# Patient Record
Sex: Female | Born: 1960 | Race: White | Hispanic: No | Marital: Single | Smoking: Former smoker
Health system: Southern US, Community
[De-identification: ages and names within clinical notes are randomized; demographics above are authoritative.]

## PROBLEM LIST (undated history)

## (undated) DIAGNOSIS — I10 Essential (primary) hypertension: Secondary | ICD-10-CM

## (undated) DIAGNOSIS — M545 Low back pain, unspecified: Secondary | ICD-10-CM

## (undated) DIAGNOSIS — G8929 Other chronic pain: Secondary | ICD-10-CM

## (undated) DIAGNOSIS — N952 Postmenopausal atrophic vaginitis: Secondary | ICD-10-CM

## (undated) DIAGNOSIS — J449 Chronic obstructive pulmonary disease, unspecified: Secondary | ICD-10-CM

## (undated) DIAGNOSIS — I809 Phlebitis and thrombophlebitis of unspecified site: Secondary | ICD-10-CM

## (undated) DIAGNOSIS — E785 Hyperlipidemia, unspecified: Secondary | ICD-10-CM

## (undated) DIAGNOSIS — F32A Depression, unspecified: Secondary | ICD-10-CM

## (undated) DIAGNOSIS — K219 Gastro-esophageal reflux disease without esophagitis: Secondary | ICD-10-CM

## (undated) DIAGNOSIS — G4733 Obstructive sleep apnea (adult) (pediatric): Secondary | ICD-10-CM

## (undated) DIAGNOSIS — M25579 Pain in unspecified ankle and joints of unspecified foot: Secondary | ICD-10-CM

## (undated) DIAGNOSIS — F329 Major depressive disorder, single episode, unspecified: Secondary | ICD-10-CM

## (undated) HISTORY — DX: Low back pain, unspecified: M54.50

## (undated) HISTORY — DX: Phlebitis and thrombophlebitis of unspecified site: I80.9

## (undated) HISTORY — DX: Hyperlipidemia, unspecified: E78.5

## (undated) HISTORY — DX: Other chronic pain: G89.29

## (undated) HISTORY — PX: CYST REMOVAL HAND: SHX6279

## (undated) HISTORY — PX: SPINE SURGERY: SHX786

## (undated) HISTORY — DX: Pain in unspecified ankle and joints of unspecified foot: M25.579

## (undated) HISTORY — DX: Obstructive sleep apnea (adult) (pediatric): G47.33

## (undated) HISTORY — DX: Essential (primary) hypertension: I10

## (undated) HISTORY — DX: Chronic obstructive pulmonary disease, unspecified: J44.9

## (undated) HISTORY — DX: Postmenopausal atrophic vaginitis: N95.2

## (undated) HISTORY — DX: Gastro-esophageal reflux disease without esophagitis: K21.9

## (undated) HISTORY — DX: Depression, unspecified: F32.A

---
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
## 1988-09-17 HISTORY — PX: BACK SURGERY: SHX140

## 2008-09-17 DIAGNOSIS — J189 Pneumonia, unspecified organism: Secondary | ICD-10-CM

## 2008-09-17 HISTORY — DX: Pneumonia, unspecified organism: J18.9

## 2011-05-22 ENCOUNTER — Encounter: Payer: Self-pay | Admitting: Vascular Surgery

## 2011-06-07 ENCOUNTER — Encounter: Payer: Self-pay | Admitting: Vascular Surgery

## 2011-06-11 ENCOUNTER — Encounter: Payer: Self-pay | Admitting: Vascular Surgery

## 2011-06-12 ENCOUNTER — Encounter: Payer: Self-pay | Admitting: Vascular Surgery

## 2011-06-12 ENCOUNTER — Ambulatory Visit (INDEPENDENT_AMBULATORY_CARE_PROVIDER_SITE_OTHER): Payer: MEDICARE | Admitting: Vascular Surgery

## 2011-06-12 VITALS — BP 111/72 | HR 81 | Resp 16 | Ht 64.0 in | Wt 265.0 lb

## 2011-06-12 DIAGNOSIS — I8310 Varicose veins of unspecified lower extremity with inflammation: Secondary | ICD-10-CM

## 2011-06-12 DIAGNOSIS — E1159 Type 2 diabetes mellitus with other circulatory complications: Secondary | ICD-10-CM

## 2011-06-12 NOTE — Progress Notes (Signed)
Subjective:     Patient ID: Brandi Velazquez, female   DOB: 09-May-1961, 50 y.o.   MRN: [id number]  HPI this 50 year old female was referred by Dr. Ardyth Gal for venous insufficiency of both lower extremities. The patient develops swelling in the calf and ankle areas the day progresses. She has developed dark scan in both lower legs over the last several years. He has no history of DVT or thrombophlebitis. She has no history of stasis ulcers or bleeding. He does have some aching throbbing discomfort in both legs as the day progresses. She does not wear elastic compression stockings for elevate her legs on her regular basis.  Past Medical History  Diagnosis Date  . Diabetes mellitus   . Ankle pain   . Hypertension   . Hyperlipidemia   . Superficial phlebitis     History  Substance Use Topics  . Smoking status: Former Smoker    Quit date: 03/20/2009  . Smokeless tobacco: Not on file  . Alcohol Use: No    Family History  Problem Relation Age of Onset  . Diabetes Mother   . Heart disease Mother   . Cancer Father   . Stroke Sister     Allergies  Allergen Reactions  . Morphine And Related     Current outpatient prescriptions:aspirin 325 MG tablet, Take 325 mg by mouth daily.  , Disp: , Rfl: ;  furosemide (LASIX) 20 MG tablet, Take 20 mg by mouth daily.  , Disp: , Rfl: ;  insulin detemir (LEVEMIR) 100 UNIT/ML injection, Inject 35 Units into the skin at bedtime.  , Disp: , Rfl: ;  lisinopril (PRINIVIL,ZESTRIL) 5 MG tablet, Take 5 mg by mouth daily.  , Disp: , Rfl:  metFORMIN (GLUMETZA) 1000 MG (MOD) 24 hr tablet, Take 1,000 mg by mouth daily with breakfast.  , Disp: , Rfl: ;  pioglitazone (ACTOS) 30 MG tablet, Take 30 mg by mouth daily.  , Disp: , Rfl: ;  pravastatin (PRAVACHOL) 10 MG tablet, Take 20 mg by mouth daily. , Disp: , Rfl:   BP 111/72  Pulse 81  Resp 16  Ht 5\' 4"  (1.626 m)  Wt 265 lb (120.203 kg)  BMI 45.49 kg/m2  SpO2 97%  Body mass index is 45.49  kg/(m^2).         Review of Systems she complains of severe back problems having had back surgery on 2 occasions. She has decreased ability to walk long distances. Reflux esophagitis, denies chest pain dyspnea on exertion PND or orthopnea. All other systems are negative and complete review of systems    Objective:   Physical Exam blood pressure 111/72 heart rate 81 respirations 16 Generally she is an obese middle-aged female no apparent distress alert and oriented x3 Chest free of rhonchi or wheezing Cardiovascular regular rhythm no murmurs carotid pulses 3+ no audible bruits Abdomen obese no palpable masses Neurologic exam normal Musculoskeletal free of major deformities Skin free of rashes. She has hyperpigmentation in both lower legs from the mid calf to the ankle medially. There are no active ulcers. She has diffuse spider and reticular veins in the lower legs below the knees. She has 1+ edema. She has 3+ femoral popliteal and 2+ dorsalis pedis pulses palpable bilaterally.  I performed a bedside sono site duplex scan today and did not see a large great saphenous vein bilaterally nor definite reflux in the superficial systems. He needs formal venous duplex reflux study bilateral     Assessment:    bilateral venous  insufficiency-etiology unknown at this time    Plan:     We'll schedule bilateral venous reflux study in our office in the very near future If there is no superficial reflux to be treated with laser ablation, treatment will consist of short leg elastic compression stockings and elevation of legs at night. I discussed this with patient and we will inform her of results once completed

## 2011-06-12 NOTE — Progress Notes (Signed)
Addended by: Sharee Pimple on: 06/12/2011 10:56 AM   Modules accepted: Orders

## 2011-06-14 ENCOUNTER — Encounter: Payer: Self-pay | Admitting: Vascular Surgery

## 2011-06-14 ENCOUNTER — Other Ambulatory Visit: Payer: Self-pay | Admitting: *Deleted

## 2011-06-14 ENCOUNTER — Ambulatory Visit (INDEPENDENT_AMBULATORY_CARE_PROVIDER_SITE_OTHER): Payer: MEDICARE | Admitting: Vascular Surgery

## 2011-06-14 DIAGNOSIS — I83893 Varicose veins of bilateral lower extremities with other complications: Secondary | ICD-10-CM

## 2011-06-14 DIAGNOSIS — I83813 Varicose veins of bilateral lower extremities with pain: Secondary | ICD-10-CM | POA: Insufficient documentation

## 2011-06-14 DIAGNOSIS — M79609 Pain in unspecified limb: Secondary | ICD-10-CM

## 2011-06-14 DIAGNOSIS — I8310 Varicose veins of unspecified lower extremity with inflammation: Secondary | ICD-10-CM

## 2011-06-14 NOTE — Progress Notes (Unsigned)
Patient came for bilateral duplex today. She does have reflux in both GSVs. Fitted her for thigh high 20-30 mm Hg compression stockings and made a three month follow up with Dr. Hart Rochester. Explained the R GSV may have only a small segment to close and that Dr. Hart Rochester would have to look at that, but that the left side is more straightforward, so we need to go through the three months of conservative tx.

## 2011-06-14 NOTE — Progress Notes (Signed)
LEVC REFLUX performed VVS 06/14/2011

## 2011-06-20 NOTE — Procedures (Unsigned)
LOWER EXTREMITY VENOUS REFLUX EXAM  INDICATION:  Lower extremity pain and varicose veins.  EXAM:  Using color-flow imaging and pulse Doppler spectral analysis, the bilateral common femoral, superficial femoral, popliteal, posterior tibial, greater and lesser saphenous veins are evaluated.  There is evidence suggesting deep venous insufficiency in the bilateral lower extremities.  The right saphenofemoral junction is competent. The left saphenofemoral junction is not competent with reflux >500 milliseconds.  The bilateral GSV's are not competent with Reflux of >555milliseconds with the caliber as described below.  The bilateral proximal short saphenous vein demonstrates competency.  GSV Diameter (used if found to be incompetent only)                                           Right    Left Proximal Greater Saphenous Vein           0.86 cm  1.01 cm Proximal-to-mid-thigh                     0.50 cm  0.62 cm Mid thigh                                 0.44 cm  0.48 cm Mid-distal thigh                          cm       cm Distal thigh                              0.49 cm  0.48 cm Knee                                      0.39 cm  0.47 cm  IMPRESSION: 1. The bilateral greater saphenous veins are not competent with reflux     >549milliseconds. 2. The right greater saphenous vein is tortuous.  The left greater     saphenous vein is not tortuous. 3. The deep venous system is not competent with reflux of     >554milliseconds. 4. The bilateral small saphenous veins are competent. 5. The right proximal mid calf demonstrates perforator measuring 0.30     cm, which appears competent.         ___________________________________________ Quita Skye. Hart Rochester, M.D.  SH/MEDQ  D:  06/14/2011  T:  06/14/2011  Job:  [id number]

## 2011-09-24 ENCOUNTER — Encounter: Payer: Self-pay | Admitting: Vascular Surgery

## 2011-09-25 ENCOUNTER — Encounter: Payer: Self-pay | Admitting: Vascular Surgery

## 2011-09-25 ENCOUNTER — Ambulatory Visit (INDEPENDENT_AMBULATORY_CARE_PROVIDER_SITE_OTHER): Payer: Medicare Other | Admitting: Vascular Surgery

## 2011-09-25 VITALS — BP 122/85 | HR 82 | Resp 18 | Ht 64.0 in | Wt 265.0 lb

## 2011-09-25 DIAGNOSIS — I83893 Varicose veins of bilateral lower extremities with other complications: Secondary | ICD-10-CM

## 2011-09-25 NOTE — Progress Notes (Signed)
Subjective:     Patient ID: Brandi Velazquez, female   DOB: 1960-12-28, 51 y.o.   MRN: [id number]  HPI this 51 year old female returns for further discussion about possible venous insufficiency of both lower extremities. She has tried long-leg elastic compression stockings 20-30 mm gradient as well as elevation and ibuprofen. She continues to have discomfort and swelling in both legs.  Today I ordered bilateral venous duplex exam which are reviewed and interpreted. She does have one area of reflux in the right great saphenous vein near the distal thigh. The left great saphenous vein has multiple areas of reflux but it is not a large vein. There is no DVT is mild reflux in the deep system. I personally looked at the left great saphenous vein with the SonoSite ultrasound and it is too small for access even know it does have some reflux.   Review of Systems     Objective:   Physical ExamBP 122/85  Pulse 82  Resp 18  Ht 5\' 4"  (1.626 m)  Wt 265 lb (120.203 kg)  BMI 45.49 kg/m2 Lower extremity exam reveals 2+ dorsalis pedis pulses palpable bilaterally. She does have hyperpigmentation medially in the lower third of the leg bilaterally. There no bulging varicosities. Her feet are well-perfused. She has 1+ edema bilaterally.    Assessment:     Pain and swelling both lower extremities but no vein large enough to treat with laser ablation.    Plan:     #1 short leg elastic compression stockings on a daily basis #2 elevate legs at night #3 if symptoms can worsen over the next few years we could repeat duplex scan to see if this vein-great saphenous vein on the left-has gotten larger and could be treated with laser ablation but currently that is not the case

## 2012-01-02 ENCOUNTER — Other Ambulatory Visit: Payer: Self-pay | Admitting: Vascular Surgery

## 2014-10-27 DIAGNOSIS — E780 Pure hypercholesterolemia: Secondary | ICD-10-CM | POA: Diagnosis not present

## 2014-10-27 DIAGNOSIS — M79662 Pain in left lower leg: Secondary | ICD-10-CM | POA: Diagnosis not present

## 2014-10-27 DIAGNOSIS — E782 Mixed hyperlipidemia: Secondary | ICD-10-CM | POA: Diagnosis not present

## 2014-10-27 DIAGNOSIS — E1165 Type 2 diabetes mellitus with hyperglycemia: Secondary | ICD-10-CM | POA: Diagnosis not present

## 2014-10-27 DIAGNOSIS — I10 Essential (primary) hypertension: Secondary | ICD-10-CM | POA: Diagnosis not present

## 2014-10-27 DIAGNOSIS — M545 Low back pain: Secondary | ICD-10-CM | POA: Diagnosis not present

## 2014-10-27 DIAGNOSIS — E119 Type 2 diabetes mellitus without complications: Secondary | ICD-10-CM | POA: Diagnosis not present

## 2014-10-28 DIAGNOSIS — M79605 Pain in left leg: Secondary | ICD-10-CM | POA: Diagnosis not present

## 2014-11-16 DIAGNOSIS — D72829 Elevated white blood cell count, unspecified: Secondary | ICD-10-CM | POA: Diagnosis not present

## 2014-11-29 DIAGNOSIS — J0100 Acute maxillary sinusitis, unspecified: Secondary | ICD-10-CM | POA: Diagnosis not present

## 2014-11-29 DIAGNOSIS — J209 Acute bronchitis, unspecified: Secondary | ICD-10-CM | POA: Diagnosis not present

## 2014-11-29 DIAGNOSIS — R05 Cough: Secondary | ICD-10-CM | POA: Diagnosis not present

## 2015-02-01 DIAGNOSIS — E1142 Type 2 diabetes mellitus with diabetic polyneuropathy: Secondary | ICD-10-CM | POA: Diagnosis not present

## 2015-02-01 DIAGNOSIS — I10 Essential (primary) hypertension: Secondary | ICD-10-CM | POA: Diagnosis not present

## 2015-02-01 DIAGNOSIS — E119 Type 2 diabetes mellitus without complications: Secondary | ICD-10-CM | POA: Diagnosis not present

## 2015-02-01 DIAGNOSIS — M545 Low back pain: Secondary | ICD-10-CM | POA: Diagnosis not present

## 2015-02-01 DIAGNOSIS — E780 Pure hypercholesterolemia: Secondary | ICD-10-CM | POA: Diagnosis not present

## 2015-02-15 DIAGNOSIS — I10 Essential (primary) hypertension: Secondary | ICD-10-CM | POA: Diagnosis not present

## 2015-03-02 DIAGNOSIS — H40003 Preglaucoma, unspecified, bilateral: Secondary | ICD-10-CM | POA: Diagnosis not present

## 2015-06-13 DIAGNOSIS — M545 Low back pain: Secondary | ICD-10-CM | POA: Diagnosis not present

## 2015-06-13 DIAGNOSIS — E1142 Type 2 diabetes mellitus with diabetic polyneuropathy: Secondary | ICD-10-CM | POA: Diagnosis not present

## 2015-06-13 DIAGNOSIS — E780 Pure hypercholesterolemia: Secondary | ICD-10-CM | POA: Diagnosis not present

## 2015-06-13 DIAGNOSIS — E1165 Type 2 diabetes mellitus with hyperglycemia: Secondary | ICD-10-CM | POA: Diagnosis not present

## 2015-06-13 DIAGNOSIS — Z23 Encounter for immunization: Secondary | ICD-10-CM | POA: Diagnosis not present

## 2015-06-13 DIAGNOSIS — I10 Essential (primary) hypertension: Secondary | ICD-10-CM | POA: Diagnosis not present

## 2015-09-16 DIAGNOSIS — I10 Essential (primary) hypertension: Secondary | ICD-10-CM | POA: Diagnosis not present

## 2015-09-16 DIAGNOSIS — E1142 Type 2 diabetes mellitus with diabetic polyneuropathy: Secondary | ICD-10-CM | POA: Diagnosis not present

## 2015-09-16 DIAGNOSIS — M545 Low back pain: Secondary | ICD-10-CM | POA: Diagnosis not present

## 2015-09-16 DIAGNOSIS — E782 Mixed hyperlipidemia: Secondary | ICD-10-CM | POA: Diagnosis not present

## 2015-09-16 DIAGNOSIS — E119 Type 2 diabetes mellitus without complications: Secondary | ICD-10-CM | POA: Diagnosis not present

## 2015-09-30 DIAGNOSIS — E119 Type 2 diabetes mellitus without complications: Secondary | ICD-10-CM | POA: Diagnosis not present

## 2015-09-30 DIAGNOSIS — H40003 Preglaucoma, unspecified, bilateral: Secondary | ICD-10-CM | POA: Diagnosis not present

## 2015-12-19 DIAGNOSIS — E1142 Type 2 diabetes mellitus with diabetic polyneuropathy: Secondary | ICD-10-CM | POA: Diagnosis not present

## 2015-12-19 DIAGNOSIS — E782 Mixed hyperlipidemia: Secondary | ICD-10-CM | POA: Diagnosis not present

## 2015-12-19 DIAGNOSIS — M545 Low back pain: Secondary | ICD-10-CM | POA: Diagnosis not present

## 2015-12-19 DIAGNOSIS — I10 Essential (primary) hypertension: Secondary | ICD-10-CM | POA: Diagnosis not present

## 2016-01-11 DIAGNOSIS — H40003 Preglaucoma, unspecified, bilateral: Secondary | ICD-10-CM | POA: Diagnosis not present

## 2016-03-21 DIAGNOSIS — M79662 Pain in left lower leg: Secondary | ICD-10-CM | POA: Diagnosis not present

## 2016-03-21 DIAGNOSIS — R600 Localized edema: Secondary | ICD-10-CM | POA: Diagnosis not present

## 2016-03-21 DIAGNOSIS — E1142 Type 2 diabetes mellitus with diabetic polyneuropathy: Secondary | ICD-10-CM | POA: Diagnosis not present

## 2016-03-21 DIAGNOSIS — E782 Mixed hyperlipidemia: Secondary | ICD-10-CM | POA: Diagnosis not present

## 2016-03-21 DIAGNOSIS — E1165 Type 2 diabetes mellitus with hyperglycemia: Secondary | ICD-10-CM | POA: Diagnosis not present

## 2016-03-21 DIAGNOSIS — M79661 Pain in right lower leg: Secondary | ICD-10-CM | POA: Diagnosis not present

## 2016-03-21 DIAGNOSIS — I10 Essential (primary) hypertension: Secondary | ICD-10-CM | POA: Diagnosis not present

## 2016-03-21 DIAGNOSIS — M545 Low back pain: Secondary | ICD-10-CM | POA: Diagnosis not present

## 2016-05-28 DIAGNOSIS — N952 Postmenopausal atrophic vaginitis: Secondary | ICD-10-CM | POA: Diagnosis not present

## 2016-05-28 DIAGNOSIS — Z0000 Encounter for general adult medical examination without abnormal findings: Secondary | ICD-10-CM | POA: Diagnosis not present

## 2016-05-28 DIAGNOSIS — Z1239 Encounter for other screening for malignant neoplasm of breast: Secondary | ICD-10-CM | POA: Diagnosis not present

## 2016-05-28 DIAGNOSIS — R600 Localized edema: Secondary | ICD-10-CM | POA: Diagnosis not present

## 2016-05-28 DIAGNOSIS — Z23 Encounter for immunization: Secondary | ICD-10-CM | POA: Diagnosis not present

## 2016-05-28 DIAGNOSIS — Z202 Contact with and (suspected) exposure to infections with a predominantly sexual mode of transmission: Secondary | ICD-10-CM | POA: Diagnosis not present

## 2016-06-12 DIAGNOSIS — Z1231 Encounter for screening mammogram for malignant neoplasm of breast: Secondary | ICD-10-CM | POA: Diagnosis not present

## 2016-06-26 DIAGNOSIS — E1142 Type 2 diabetes mellitus with diabetic polyneuropathy: Secondary | ICD-10-CM | POA: Diagnosis not present

## 2016-06-26 DIAGNOSIS — I87323 Chronic venous hypertension (idiopathic) with inflammation of bilateral lower extremity: Secondary | ICD-10-CM | POA: Diagnosis not present

## 2016-06-26 DIAGNOSIS — R600 Localized edema: Secondary | ICD-10-CM | POA: Diagnosis not present

## 2016-06-26 DIAGNOSIS — E782 Mixed hyperlipidemia: Secondary | ICD-10-CM | POA: Diagnosis not present

## 2016-06-26 DIAGNOSIS — I10 Essential (primary) hypertension: Secondary | ICD-10-CM | POA: Diagnosis not present

## 2016-06-26 DIAGNOSIS — M545 Low back pain: Secondary | ICD-10-CM | POA: Diagnosis not present

## 2016-07-11 ENCOUNTER — Other Ambulatory Visit: Payer: Self-pay

## 2016-07-11 NOTE — Patient Outreach (Signed)
[facility] [facility]) Care Management  07/11/2016  Brandi Velazquez [birth year] [id number]  REFERRAL DATE: 07/03/16  REFERRAL SOURCE: primary MD referral REFERRAL REASON;  Health and medication management assistance  Telephone call to patient regarding primary MD referral. Unable to reach patient. HIPAA compliant voice message left with call back phone number.  PLAN; RNCM will attempt 2nd telephone call to patient within 1 week.   Quinn Plowman RN,BSN,CCM [facility] Telephonic  [phone]

## 2016-07-13 ENCOUNTER — Other Ambulatory Visit: Payer: Self-pay

## 2016-07-13 NOTE — Patient Outreach (Signed)
[facility] [facility]) Care Management  07/13/2016  UTA BLYDENBURGH 08/05/1961 [id number]  REFERRAL DATE; 07/03/16 REFERRAL SOURCE: primary MD  REFERRAL REASON; health, medication management ( assistance) CONSENT: self  SUBJECTIVE: Telephone call to patient regarding primary MD referral. HIPAA verified with patient. Discussed and offered [facility] care management services with patient.   DIABETES; Patient states she has been diabetic for approximately 20 years. States her most recent A1c was 8.1.  Patient states she has had trouble affording her insulin at times and has made decisions to cut her dose in half to make it last. Patient states this has cause her blood sugar levels to go up.  Patient states currently she is taking her medications as her doctor has prescribed. Patient states she has had a low blood sugar of 30 with the past few months.  Patient states she is aware when her blood sugar gets low and she will eat something sweet and then regular food. Patient states her high blood sugar has been around 175. Patient reports she has a follow up appointment with her primary MD in 3 months.   FALLS: Patient states she has had 1 fall within the past 3 months. Patient states she feel approximately 1 week ago. States she hurt her left leg and right arm. Patient states she has been away on vacation so she did not see a doctor.  Patient states she is on her way back home from vacation today. Patient states the pain in her arm is worse than her leg. Patient states on scale of 1-10 with 10 being the worse pain her arm pain is a 7. Patient states it hurts when she moves it. RNCM advised patient to contact primary MD office as soon as she returns home to be seen.  RNCM also advised patient can follow up at an urgent care center as soon as she returns home.  Patient verbalized understanding and agreement.   Patient states she has had back surgery in the past and has some circulation problems in her legs.   Patient states her left leg is the most affected. Patient states she ambulates with a cane.   MEDICATION: Patient states she has trouble affording her medications because she ends up in the donut hole early in the year. Patient states she has made decisions in the past to cut her medication dosages to try to make her medications last.   Patient verbalized agreement to have follow up with health coach and pharmacist.   ASSESSMENT: Primary MD referral. Patient would benefit from health coach and pharmacy follow up.   PLAN:  RNCM will refer patient to health coach and pharmacist.   Quinn Plowman RN,BSN,CCM [facility] Telephonic  [phone]

## 2016-07-14 DIAGNOSIS — S52121A Displaced fracture of head of right radius, initial encounter for closed fracture: Secondary | ICD-10-CM | POA: Diagnosis not present

## 2016-07-14 DIAGNOSIS — L02811 Cutaneous abscess of head [any part, except face]: Secondary | ICD-10-CM | POA: Diagnosis not present

## 2016-07-14 DIAGNOSIS — S59901A Unspecified injury of right elbow, initial encounter: Secondary | ICD-10-CM | POA: Diagnosis not present

## 2016-07-14 DIAGNOSIS — S42401A Unspecified fracture of lower end of right humerus, initial encounter for closed fracture: Secondary | ICD-10-CM | POA: Diagnosis not present

## 2016-07-14 DIAGNOSIS — S52124A Nondisplaced fracture of head of right radius, initial encounter for closed fracture: Secondary | ICD-10-CM | POA: Diagnosis not present

## 2016-07-14 DIAGNOSIS — S4991XA Unspecified injury of right shoulder and upper arm, initial encounter: Secondary | ICD-10-CM | POA: Diagnosis not present

## 2016-07-17 DIAGNOSIS — S52124A Nondisplaced fracture of head of right radius, initial encounter for closed fracture: Secondary | ICD-10-CM | POA: Diagnosis not present

## 2016-07-17 DIAGNOSIS — L02811 Cutaneous abscess of head [any part, except face]: Secondary | ICD-10-CM | POA: Diagnosis not present

## 2016-07-19 DIAGNOSIS — S52124A Nondisplaced fracture of head of right radius, initial encounter for closed fracture: Secondary | ICD-10-CM | POA: Diagnosis not present

## 2016-07-20 DIAGNOSIS — S52124A Nondisplaced fracture of head of right radius, initial encounter for closed fracture: Secondary | ICD-10-CM | POA: Diagnosis not present

## 2016-07-20 DIAGNOSIS — S5001XD Contusion of right elbow, subsequent encounter: Secondary | ICD-10-CM | POA: Diagnosis not present

## 2016-07-20 DIAGNOSIS — L02811 Cutaneous abscess of head [any part, except face]: Secondary | ICD-10-CM | POA: Diagnosis not present

## 2016-07-27 DIAGNOSIS — H40013 Open angle with borderline findings, low risk, bilateral: Secondary | ICD-10-CM | POA: Diagnosis not present

## 2016-07-30 ENCOUNTER — Other Ambulatory Visit: Payer: Self-pay | Admitting: *Deleted

## 2016-07-30 NOTE — Patient Outreach (Signed)
[facility]) Care Management  07/30/2016  DAFNEE BRODHEAD 17-Sep-1961 [id number]  RN Health Coach attempted #1 Follow up outreach call to patient.  Patient was unavailable. HIPPA compliance voicemail message was left with return callback number.   Plan: RN will call patient again within 14 days.   [place] Care Management [phone]

## 2016-08-06 ENCOUNTER — Other Ambulatory Visit: Payer: Self-pay | Admitting: Pharmacist

## 2016-08-06 NOTE — Patient Outreach (Signed)
[facility] [facility]) Care Management  08/06/2016  SHAHARA WINGERT [birth year] [id number]  Patient referred to [place] by [place] for "medication assistance discussion."   Unsuccessful phone outreach to patient.  HIPAA compliant voice message left requesting return call.   Plan:  Will make a second outreach attempt in the next week.   Karrie Meres, PharmD, [place] [phone]

## 2016-08-10 ENCOUNTER — Other Ambulatory Visit: Payer: Self-pay | Admitting: Pharmacist

## 2016-08-10 NOTE — Patient Outreach (Signed)
[facility]) Care Management  08/10/2016  Brandi Velazquez Jul 13, 1961 [id number]   Second outreach attempt to patient was successful.  Explained purpose of phone call, patient states she was traveling and would prefer a call back later.    Plan:  Will attempt to reach patient again next week.    Karrie Meres, PharmD, [place] [phone]

## 2016-08-13 ENCOUNTER — Other Ambulatory Visit: Payer: Self-pay | Admitting: *Deleted

## 2016-08-13 NOTE — Patient Outreach (Signed)
[facility]) Care Management  08/13/2016  Brandi Velazquez [birth year] [id number]   RN Health Coach  Attempted#2  Follow up outreach call to patient.  Patient was unavailable. HIPPA compliance voicemail message was left with return callback number.   Plan: RN will call patient again within 14 days.    [place] Care Management [phone]

## 2016-08-17 ENCOUNTER — Other Ambulatory Visit: Payer: Self-pay | Admitting: Pharmacist

## 2016-08-17 NOTE — Patient Outreach (Signed)
[facility]) Care Management  08/17/2016  DEZTINEE AIKEN 1961/03/03 [id number]  Unsuccessful outreach attempt to patient.  HIPAA compliant voice mail left requesting a return call.   Plan:  Will make an outreach attempt to patient next week if no return call.   Karrie Meres, PharmD, [place] [phone]

## 2016-08-21 ENCOUNTER — Other Ambulatory Visit: Payer: Self-pay | Admitting: Pharmacist

## 2016-08-21 NOTE — Patient Outreach (Signed)
[place] [facility]) Care Management  08/21/2016  SUZI MCGLOWN 1961/07/28 [id number]  Second unsuccessful phone outreach attempt to patient.  HIPAA compliant voicemail left requesting a return call.   Plan:  Will make third phone outreach attempt next week if no return call.   Karrie Meres, PharmD, [place] [phone]

## 2016-08-22 ENCOUNTER — Ambulatory Visit: Payer: Self-pay

## 2016-08-22 ENCOUNTER — Other Ambulatory Visit: Payer: Self-pay

## 2016-08-22 NOTE — Patient Outreach (Signed)
[facility]) Care Management  08/22/2016  JACKYE MULVEY August 04, 1961 [id number]   3rd telephone call to patient for assessment. No answer.  Unable to leave a message as voice mail is full.    Plan: RN health Coach will send letter to attempt outreach.  If no response in the next ten business days will close case.  Jone Baseman, RN, MSN [place] [phone]

## 2016-08-24 ENCOUNTER — Other Ambulatory Visit: Payer: Self-pay | Admitting: Pharmacist

## 2016-08-24 NOTE — Patient Outreach (Signed)
[place] [facility]) Care Management  08/24/2016  Brandi Velazquez 1961-04-22 [id number]  Unsuccessful phone outreach to patient to follow-up her medication assistance needs.  [place] has been unsuccessful in contacting patient and mailed an outreach letter to patient.    Plan:  Will make another outreach attempt to patient next week.   Karrie Meres, PharmD, [place] [phone]

## 2016-08-29 ENCOUNTER — Other Ambulatory Visit: Payer: Self-pay | Admitting: Pharmacist

## 2016-08-29 NOTE — Patient Outreach (Signed)
[facility]) Care Management  08/29/2016  Brandi Velazquez 06/06/1961 [id number]  Third outreach attempt to patient.  She was initially referred to [place] for medication assistance evaluation.   Today patient answered phone.  She states she can't talk because she is busy.  Offered to schedule time for a call with patient and she agreed.    Patient has been difficult to reach.   Plan:  Will call patient as agreed.    Karrie Meres, PharmD, [place] [phone]

## 2016-08-30 ENCOUNTER — Other Ambulatory Visit: Payer: Self-pay | Admitting: Pharmacist

## 2016-08-30 NOTE — Patient Outreach (Signed)
[facility]) Care Management  08/30/2016  Brandi Velazquez 1961/06/22 [id number]  Unsuccessful phone outreach to patient, no answer, HIPAA compliant message left.    This is second time phone call that was scheduled with patient and patient did not answer call.   [place] has sent patient an Economist.     Plan:  Given the multiple unsuccessful outreach attempts, if patient does not return call, will consider case closure due to inability to maintain contact with patient.   Karrie Meres, PharmD, [place] [phone]

## 2016-09-06 ENCOUNTER — Other Ambulatory Visit: Payer: Self-pay

## 2016-09-06 NOTE — Patient Outreach (Signed)
[place]) Care Management  09/06/2016  Brandi Velazquez 29-Mar-1961 [id number]   Patient has not responded to calls or letter from [facility] ED Utilization referral.  Will proceed to close case. Will notify care management assistant of patient status.    Jone Baseman, RN, MSN [place] [phone]

## 2016-10-08 DIAGNOSIS — R600 Localized edema: Secondary | ICD-10-CM | POA: Diagnosis not present

## 2016-10-08 DIAGNOSIS — I10 Essential (primary) hypertension: Secondary | ICD-10-CM | POA: Diagnosis not present

## 2016-10-08 DIAGNOSIS — M545 Low back pain: Secondary | ICD-10-CM | POA: Diagnosis not present

## 2016-10-08 DIAGNOSIS — E1142 Type 2 diabetes mellitus with diabetic polyneuropathy: Secondary | ICD-10-CM | POA: Diagnosis not present

## 2016-10-08 DIAGNOSIS — Z1211 Encounter for screening for malignant neoplasm of colon: Secondary | ICD-10-CM | POA: Diagnosis not present

## 2016-10-08 DIAGNOSIS — E782 Mixed hyperlipidemia: Secondary | ICD-10-CM | POA: Diagnosis not present

## 2016-10-25 ENCOUNTER — Other Ambulatory Visit: Payer: Self-pay | Admitting: Pharmacist

## 2016-10-25 NOTE — Patient Outreach (Signed)
[facility]) Care Management  10/25/2016  AMII AMBROSI 12-11-1960 [id number]  [facility] Pharmacist has made multiple phone outreach attempts to patient and had two scheduled phone calls with patient.  Patient did not answer for either scheduled phone outreach call.   Given patient has not returned calls and did not respond to [place] outreach letter, will close this case due to inability to maintain contact with patient.   Karrie Meres, PharmD, [place] [phone]

## 2016-11-15 DIAGNOSIS — Z1211 Encounter for screening for malignant neoplasm of colon: Secondary | ICD-10-CM | POA: Diagnosis not present

## 2016-11-15 DIAGNOSIS — Z86010 Personal history of colonic polyps: Secondary | ICD-10-CM | POA: Diagnosis not present

## 2016-12-31 DIAGNOSIS — D125 Benign neoplasm of sigmoid colon: Secondary | ICD-10-CM | POA: Diagnosis not present

## 2016-12-31 DIAGNOSIS — K648 Other hemorrhoids: Secondary | ICD-10-CM | POA: Diagnosis not present

## 2016-12-31 DIAGNOSIS — Z86010 Personal history of colonic polyps: Secondary | ICD-10-CM | POA: Diagnosis not present

## 2016-12-31 DIAGNOSIS — K5730 Diverticulosis of large intestine without perforation or abscess without bleeding: Secondary | ICD-10-CM | POA: Diagnosis not present

## 2016-12-31 DIAGNOSIS — K635 Polyp of colon: Secondary | ICD-10-CM | POA: Diagnosis not present

## 2016-12-31 DIAGNOSIS — Z1211 Encounter for screening for malignant neoplasm of colon: Secondary | ICD-10-CM | POA: Diagnosis not present

## 2016-12-31 LAB — HM COLONOSCOPY

## 2017-01-01 LAB — HM COLONOSCOPY

## 2017-01-17 DIAGNOSIS — E1121 Type 2 diabetes mellitus with diabetic nephropathy: Secondary | ICD-10-CM | POA: Diagnosis not present

## 2017-01-17 DIAGNOSIS — E1165 Type 2 diabetes mellitus with hyperglycemia: Secondary | ICD-10-CM | POA: Diagnosis not present

## 2017-01-17 DIAGNOSIS — G4733 Obstructive sleep apnea (adult) (pediatric): Secondary | ICD-10-CM | POA: Diagnosis not present

## 2017-01-17 DIAGNOSIS — E782 Mixed hyperlipidemia: Secondary | ICD-10-CM | POA: Diagnosis not present

## 2017-01-17 DIAGNOSIS — I10 Essential (primary) hypertension: Secondary | ICD-10-CM | POA: Diagnosis not present

## 2017-01-24 DIAGNOSIS — E119 Type 2 diabetes mellitus without complications: Secondary | ICD-10-CM | POA: Diagnosis not present

## 2017-01-24 DIAGNOSIS — H40023 Open angle with borderline findings, high risk, bilateral: Secondary | ICD-10-CM | POA: Diagnosis not present

## 2017-02-18 DIAGNOSIS — G4733 Obstructive sleep apnea (adult) (pediatric): Secondary | ICD-10-CM | POA: Diagnosis not present

## 2017-04-19 DIAGNOSIS — Z6841 Body Mass Index (BMI) 40.0 and over, adult: Secondary | ICD-10-CM | POA: Diagnosis not present

## 2017-04-19 DIAGNOSIS — I10 Essential (primary) hypertension: Secondary | ICD-10-CM | POA: Diagnosis not present

## 2017-04-19 DIAGNOSIS — G4733 Obstructive sleep apnea (adult) (pediatric): Secondary | ICD-10-CM | POA: Diagnosis not present

## 2017-04-19 DIAGNOSIS — E1121 Type 2 diabetes mellitus with diabetic nephropathy: Secondary | ICD-10-CM | POA: Diagnosis not present

## 2017-04-19 DIAGNOSIS — E782 Mixed hyperlipidemia: Secondary | ICD-10-CM | POA: Diagnosis not present

## 2017-05-10 DIAGNOSIS — H40023 Open angle with borderline findings, high risk, bilateral: Secondary | ICD-10-CM | POA: Diagnosis not present

## 2017-06-05 DIAGNOSIS — G4733 Obstructive sleep apnea (adult) (pediatric): Secondary | ICD-10-CM | POA: Diagnosis not present

## 2017-06-13 DIAGNOSIS — Z1231 Encounter for screening mammogram for malignant neoplasm of breast: Secondary | ICD-10-CM | POA: Diagnosis not present

## 2017-07-05 DIAGNOSIS — G4733 Obstructive sleep apnea (adult) (pediatric): Secondary | ICD-10-CM | POA: Diagnosis not present

## 2017-07-24 DIAGNOSIS — Z6841 Body Mass Index (BMI) 40.0 and over, adult: Secondary | ICD-10-CM | POA: Diagnosis not present

## 2017-07-24 DIAGNOSIS — Z23 Encounter for immunization: Secondary | ICD-10-CM | POA: Diagnosis not present

## 2017-07-24 DIAGNOSIS — Z0000 Encounter for general adult medical examination without abnormal findings: Secondary | ICD-10-CM | POA: Diagnosis not present

## 2017-08-05 DIAGNOSIS — G4733 Obstructive sleep apnea (adult) (pediatric): Secondary | ICD-10-CM | POA: Diagnosis not present

## 2017-08-15 DIAGNOSIS — R600 Localized edema: Secondary | ICD-10-CM | POA: Diagnosis not present

## 2017-08-19 DIAGNOSIS — R7309 Other abnormal glucose: Secondary | ICD-10-CM | POA: Diagnosis not present

## 2017-08-19 DIAGNOSIS — R600 Localized edema: Secondary | ICD-10-CM | POA: Diagnosis not present

## 2017-08-23 DIAGNOSIS — E782 Mixed hyperlipidemia: Secondary | ICD-10-CM | POA: Diagnosis not present

## 2017-08-23 DIAGNOSIS — E1121 Type 2 diabetes mellitus with diabetic nephropathy: Secondary | ICD-10-CM | POA: Diagnosis not present

## 2017-08-23 DIAGNOSIS — I10 Essential (primary) hypertension: Secondary | ICD-10-CM | POA: Diagnosis not present

## 2017-08-23 DIAGNOSIS — Z6841 Body Mass Index (BMI) 40.0 and over, adult: Secondary | ICD-10-CM | POA: Diagnosis not present

## 2017-08-23 DIAGNOSIS — G4733 Obstructive sleep apnea (adult) (pediatric): Secondary | ICD-10-CM | POA: Diagnosis not present

## 2017-09-04 DIAGNOSIS — Z6841 Body Mass Index (BMI) 40.0 and over, adult: Secondary | ICD-10-CM | POA: Diagnosis not present

## 2017-09-04 DIAGNOSIS — G4733 Obstructive sleep apnea (adult) (pediatric): Secondary | ICD-10-CM | POA: Diagnosis not present

## 2017-09-04 DIAGNOSIS — E1121 Type 2 diabetes mellitus with diabetic nephropathy: Secondary | ICD-10-CM | POA: Diagnosis not present

## 2017-09-04 DIAGNOSIS — G5603 Carpal tunnel syndrome, bilateral upper limbs: Secondary | ICD-10-CM | POA: Diagnosis not present

## 2017-09-18 DIAGNOSIS — J028 Acute pharyngitis due to other specified organisms: Secondary | ICD-10-CM | POA: Diagnosis not present

## 2017-09-18 DIAGNOSIS — H65191 Other acute nonsuppurative otitis media, right ear: Secondary | ICD-10-CM | POA: Diagnosis not present

## 2017-09-18 DIAGNOSIS — E1121 Type 2 diabetes mellitus with diabetic nephropathy: Secondary | ICD-10-CM | POA: Diagnosis not present

## 2017-09-25 DIAGNOSIS — R05 Cough: Secondary | ICD-10-CM | POA: Diagnosis not present

## 2017-09-25 DIAGNOSIS — J208 Acute bronchitis due to other specified organisms: Secondary | ICD-10-CM | POA: Diagnosis not present

## 2017-10-05 DIAGNOSIS — G4733 Obstructive sleep apnea (adult) (pediatric): Secondary | ICD-10-CM | POA: Diagnosis not present

## 2017-11-05 DIAGNOSIS — G4733 Obstructive sleep apnea (adult) (pediatric): Secondary | ICD-10-CM | POA: Diagnosis not present

## 2017-12-02 DIAGNOSIS — G4733 Obstructive sleep apnea (adult) (pediatric): Secondary | ICD-10-CM | POA: Diagnosis not present

## 2017-12-03 DIAGNOSIS — G4733 Obstructive sleep apnea (adult) (pediatric): Secondary | ICD-10-CM | POA: Diagnosis not present

## 2017-12-23 DIAGNOSIS — E782 Mixed hyperlipidemia: Secondary | ICD-10-CM | POA: Diagnosis not present

## 2017-12-23 DIAGNOSIS — Z6841 Body Mass Index (BMI) 40.0 and over, adult: Secondary | ICD-10-CM | POA: Diagnosis not present

## 2017-12-23 DIAGNOSIS — G4733 Obstructive sleep apnea (adult) (pediatric): Secondary | ICD-10-CM | POA: Diagnosis not present

## 2017-12-23 DIAGNOSIS — E1121 Type 2 diabetes mellitus with diabetic nephropathy: Secondary | ICD-10-CM | POA: Diagnosis not present

## 2017-12-23 DIAGNOSIS — I10 Essential (primary) hypertension: Secondary | ICD-10-CM | POA: Diagnosis not present

## 2017-12-23 DIAGNOSIS — G5603 Carpal tunnel syndrome, bilateral upper limbs: Secondary | ICD-10-CM | POA: Diagnosis not present

## 2018-01-03 DIAGNOSIS — G4733 Obstructive sleep apnea (adult) (pediatric): Secondary | ICD-10-CM | POA: Diagnosis not present

## 2018-02-02 DIAGNOSIS — G4733 Obstructive sleep apnea (adult) (pediatric): Secondary | ICD-10-CM | POA: Diagnosis not present

## 2018-02-27 DIAGNOSIS — H40013 Open angle with borderline findings, low risk, bilateral: Secondary | ICD-10-CM | POA: Diagnosis not present

## 2018-02-27 DIAGNOSIS — H25813 Combined forms of age-related cataract, bilateral: Secondary | ICD-10-CM | POA: Diagnosis not present

## 2018-02-27 DIAGNOSIS — E119 Type 2 diabetes mellitus without complications: Secondary | ICD-10-CM | POA: Diagnosis not present

## 2018-03-05 DIAGNOSIS — G4733 Obstructive sleep apnea (adult) (pediatric): Secondary | ICD-10-CM | POA: Diagnosis not present

## 2018-04-04 DIAGNOSIS — G4733 Obstructive sleep apnea (adult) (pediatric): Secondary | ICD-10-CM | POA: Diagnosis not present

## 2018-04-29 DIAGNOSIS — E782 Mixed hyperlipidemia: Secondary | ICD-10-CM | POA: Diagnosis not present

## 2018-04-29 DIAGNOSIS — E1142 Type 2 diabetes mellitus with diabetic polyneuropathy: Secondary | ICD-10-CM | POA: Diagnosis not present

## 2018-04-29 DIAGNOSIS — I10 Essential (primary) hypertension: Secondary | ICD-10-CM | POA: Diagnosis not present

## 2018-04-30 DIAGNOSIS — E1121 Type 2 diabetes mellitus with diabetic nephropathy: Secondary | ICD-10-CM | POA: Diagnosis not present

## 2018-04-30 DIAGNOSIS — Z6841 Body Mass Index (BMI) 40.0 and over, adult: Secondary | ICD-10-CM | POA: Diagnosis not present

## 2018-04-30 DIAGNOSIS — G4733 Obstructive sleep apnea (adult) (pediatric): Secondary | ICD-10-CM | POA: Diagnosis not present

## 2018-04-30 DIAGNOSIS — I10 Essential (primary) hypertension: Secondary | ICD-10-CM | POA: Diagnosis not present

## 2018-04-30 DIAGNOSIS — E782 Mixed hyperlipidemia: Secondary | ICD-10-CM | POA: Diagnosis not present

## 2018-05-05 DIAGNOSIS — G4733 Obstructive sleep apnea (adult) (pediatric): Secondary | ICD-10-CM | POA: Diagnosis not present

## 2018-06-05 DIAGNOSIS — G4733 Obstructive sleep apnea (adult) (pediatric): Secondary | ICD-10-CM | POA: Diagnosis not present

## 2018-06-17 DIAGNOSIS — G4733 Obstructive sleep apnea (adult) (pediatric): Secondary | ICD-10-CM | POA: Diagnosis not present

## 2018-06-17 DIAGNOSIS — R809 Proteinuria, unspecified: Secondary | ICD-10-CM | POA: Diagnosis not present

## 2018-06-17 DIAGNOSIS — E669 Obesity, unspecified: Secondary | ICD-10-CM | POA: Diagnosis not present

## 2018-06-17 DIAGNOSIS — R309 Painful micturition, unspecified: Secondary | ICD-10-CM | POA: Diagnosis not present

## 2018-06-17 DIAGNOSIS — E1169 Type 2 diabetes mellitus with other specified complication: Secondary | ICD-10-CM | POA: Diagnosis not present

## 2018-07-15 DIAGNOSIS — M545 Low back pain: Secondary | ICD-10-CM | POA: Diagnosis not present

## 2018-07-15 DIAGNOSIS — J208 Acute bronchitis due to other specified organisms: Secondary | ICD-10-CM | POA: Diagnosis not present

## 2018-07-22 DIAGNOSIS — R05 Cough: Secondary | ICD-10-CM | POA: Diagnosis not present

## 2018-07-22 DIAGNOSIS — J441 Chronic obstructive pulmonary disease with (acute) exacerbation: Secondary | ICD-10-CM | POA: Diagnosis not present

## 2018-08-01 DIAGNOSIS — E1142 Type 2 diabetes mellitus with diabetic polyneuropathy: Secondary | ICD-10-CM | POA: Diagnosis not present

## 2018-08-01 DIAGNOSIS — E782 Mixed hyperlipidemia: Secondary | ICD-10-CM | POA: Diagnosis not present

## 2018-08-01 DIAGNOSIS — I10 Essential (primary) hypertension: Secondary | ICD-10-CM | POA: Diagnosis not present

## 2018-08-04 DIAGNOSIS — Z6841 Body Mass Index (BMI) 40.0 and over, adult: Secondary | ICD-10-CM | POA: Diagnosis not present

## 2018-08-04 DIAGNOSIS — Z23 Encounter for immunization: Secondary | ICD-10-CM | POA: Diagnosis not present

## 2018-08-04 DIAGNOSIS — I10 Essential (primary) hypertension: Secondary | ICD-10-CM | POA: Diagnosis not present

## 2018-08-04 DIAGNOSIS — G4733 Obstructive sleep apnea (adult) (pediatric): Secondary | ICD-10-CM | POA: Diagnosis not present

## 2018-08-04 DIAGNOSIS — E1121 Type 2 diabetes mellitus with diabetic nephropathy: Secondary | ICD-10-CM | POA: Diagnosis not present

## 2018-08-04 DIAGNOSIS — E782 Mixed hyperlipidemia: Secondary | ICD-10-CM | POA: Diagnosis not present

## 2018-09-03 DIAGNOSIS — H40013 Open angle with borderline findings, low risk, bilateral: Secondary | ICD-10-CM | POA: Diagnosis not present

## 2018-09-08 DIAGNOSIS — Z1231 Encounter for screening mammogram for malignant neoplasm of breast: Secondary | ICD-10-CM | POA: Diagnosis not present

## 2018-09-08 DIAGNOSIS — Z6841 Body Mass Index (BMI) 40.0 and over, adult: Secondary | ICD-10-CM | POA: Diagnosis not present

## 2018-09-08 DIAGNOSIS — Z0000 Encounter for general adult medical examination without abnormal findings: Secondary | ICD-10-CM | POA: Diagnosis not present

## 2018-10-27 DIAGNOSIS — E1121 Type 2 diabetes mellitus with diabetic nephropathy: Secondary | ICD-10-CM | POA: Diagnosis not present

## 2018-10-27 DIAGNOSIS — R3915 Urgency of urination: Secondary | ICD-10-CM | POA: Diagnosis not present

## 2018-11-10 DIAGNOSIS — E782 Mixed hyperlipidemia: Secondary | ICD-10-CM | POA: Diagnosis not present

## 2018-11-10 DIAGNOSIS — I10 Essential (primary) hypertension: Secondary | ICD-10-CM | POA: Diagnosis not present

## 2018-11-10 DIAGNOSIS — E1121 Type 2 diabetes mellitus with diabetic nephropathy: Secondary | ICD-10-CM | POA: Diagnosis not present

## 2018-11-12 DIAGNOSIS — E669 Obesity, unspecified: Secondary | ICD-10-CM | POA: Diagnosis not present

## 2018-11-12 DIAGNOSIS — E1169 Type 2 diabetes mellitus with other specified complication: Secondary | ICD-10-CM | POA: Diagnosis not present

## 2018-11-12 DIAGNOSIS — R809 Proteinuria, unspecified: Secondary | ICD-10-CM | POA: Diagnosis not present

## 2018-11-12 DIAGNOSIS — G4733 Obstructive sleep apnea (adult) (pediatric): Secondary | ICD-10-CM | POA: Diagnosis not present

## 2018-12-01 DIAGNOSIS — I10 Essential (primary) hypertension: Secondary | ICD-10-CM | POA: Diagnosis not present

## 2018-12-01 DIAGNOSIS — G5603 Carpal tunnel syndrome, bilateral upper limbs: Secondary | ICD-10-CM | POA: Diagnosis not present

## 2018-12-01 DIAGNOSIS — E782 Mixed hyperlipidemia: Secondary | ICD-10-CM | POA: Diagnosis not present

## 2018-12-01 DIAGNOSIS — M25641 Stiffness of right hand, not elsewhere classified: Secondary | ICD-10-CM | POA: Diagnosis not present

## 2018-12-01 DIAGNOSIS — E1121 Type 2 diabetes mellitus with diabetic nephropathy: Secondary | ICD-10-CM | POA: Diagnosis not present

## 2018-12-01 DIAGNOSIS — M25642 Stiffness of left hand, not elsewhere classified: Secondary | ICD-10-CM | POA: Diagnosis not present

## 2018-12-01 DIAGNOSIS — Z6841 Body Mass Index (BMI) 40.0 and over, adult: Secondary | ICD-10-CM | POA: Diagnosis not present

## 2018-12-01 DIAGNOSIS — G4733 Obstructive sleep apnea (adult) (pediatric): Secondary | ICD-10-CM | POA: Diagnosis not present

## 2019-03-03 DIAGNOSIS — E119 Type 2 diabetes mellitus without complications: Secondary | ICD-10-CM | POA: Diagnosis not present

## 2019-03-04 DIAGNOSIS — E1121 Type 2 diabetes mellitus with diabetic nephropathy: Secondary | ICD-10-CM | POA: Diagnosis not present

## 2019-03-04 DIAGNOSIS — E782 Mixed hyperlipidemia: Secondary | ICD-10-CM | POA: Diagnosis not present

## 2019-03-04 DIAGNOSIS — I10 Essential (primary) hypertension: Secondary | ICD-10-CM | POA: Diagnosis not present

## 2019-03-05 DIAGNOSIS — G5603 Carpal tunnel syndrome, bilateral upper limbs: Secondary | ICD-10-CM | POA: Diagnosis not present

## 2019-03-05 DIAGNOSIS — I10 Essential (primary) hypertension: Secondary | ICD-10-CM | POA: Diagnosis not present

## 2019-03-05 DIAGNOSIS — Z6841 Body Mass Index (BMI) 40.0 and over, adult: Secondary | ICD-10-CM | POA: Diagnosis not present

## 2019-03-05 DIAGNOSIS — G4733 Obstructive sleep apnea (adult) (pediatric): Secondary | ICD-10-CM | POA: Diagnosis not present

## 2019-03-05 DIAGNOSIS — E1121 Type 2 diabetes mellitus with diabetic nephropathy: Secondary | ICD-10-CM | POA: Diagnosis not present

## 2019-03-05 DIAGNOSIS — E782 Mixed hyperlipidemia: Secondary | ICD-10-CM | POA: Diagnosis not present

## 2019-04-02 DIAGNOSIS — G5603 Carpal tunnel syndrome, bilateral upper limbs: Secondary | ICD-10-CM | POA: Diagnosis not present

## 2019-04-24 DIAGNOSIS — E119 Type 2 diabetes mellitus without complications: Secondary | ICD-10-CM | POA: Diagnosis not present

## 2019-04-24 DIAGNOSIS — E7800 Pure hypercholesterolemia, unspecified: Secondary | ICD-10-CM | POA: Diagnosis not present

## 2019-04-24 DIAGNOSIS — I10 Essential (primary) hypertension: Secondary | ICD-10-CM | POA: Diagnosis not present

## 2019-04-24 DIAGNOSIS — G4733 Obstructive sleep apnea (adult) (pediatric): Secondary | ICD-10-CM | POA: Diagnosis not present

## 2019-04-24 DIAGNOSIS — G5601 Carpal tunnel syndrome, right upper limb: Secondary | ICD-10-CM | POA: Diagnosis not present

## 2019-04-24 DIAGNOSIS — G5603 Carpal tunnel syndrome, bilateral upper limbs: Secondary | ICD-10-CM | POA: Diagnosis not present

## 2019-04-24 DIAGNOSIS — G4730 Sleep apnea, unspecified: Secondary | ICD-10-CM | POA: Diagnosis not present

## 2019-04-24 DIAGNOSIS — Z794 Long term (current) use of insulin: Secondary | ICD-10-CM | POA: Diagnosis not present

## 2019-04-24 DIAGNOSIS — Z79899 Other long term (current) drug therapy: Secondary | ICD-10-CM | POA: Diagnosis not present

## 2019-04-24 DIAGNOSIS — Z87891 Personal history of nicotine dependence: Secondary | ICD-10-CM | POA: Diagnosis not present

## 2019-06-05 DIAGNOSIS — E782 Mixed hyperlipidemia: Secondary | ICD-10-CM | POA: Diagnosis not present

## 2019-06-05 DIAGNOSIS — E1169 Type 2 diabetes mellitus with other specified complication: Secondary | ICD-10-CM | POA: Diagnosis not present

## 2019-06-11 DIAGNOSIS — Z6841 Body Mass Index (BMI) 40.0 and over, adult: Secondary | ICD-10-CM | POA: Diagnosis not present

## 2019-06-11 DIAGNOSIS — G4733 Obstructive sleep apnea (adult) (pediatric): Secondary | ICD-10-CM | POA: Diagnosis not present

## 2019-06-11 DIAGNOSIS — E782 Mixed hyperlipidemia: Secondary | ICD-10-CM | POA: Diagnosis not present

## 2019-06-11 DIAGNOSIS — I10 Essential (primary) hypertension: Secondary | ICD-10-CM | POA: Diagnosis not present

## 2019-06-11 DIAGNOSIS — E1121 Type 2 diabetes mellitus with diabetic nephropathy: Secondary | ICD-10-CM | POA: Diagnosis not present

## 2019-06-11 DIAGNOSIS — Z23 Encounter for immunization: Secondary | ICD-10-CM | POA: Diagnosis not present

## 2019-07-02 DIAGNOSIS — G5603 Carpal tunnel syndrome, bilateral upper limbs: Secondary | ICD-10-CM | POA: Diagnosis not present

## 2019-07-17 DIAGNOSIS — Z87891 Personal history of nicotine dependence: Secondary | ICD-10-CM | POA: Diagnosis not present

## 2019-07-17 DIAGNOSIS — E7800 Pure hypercholesterolemia, unspecified: Secondary | ICD-10-CM | POA: Diagnosis not present

## 2019-07-17 DIAGNOSIS — G4733 Obstructive sleep apnea (adult) (pediatric): Secondary | ICD-10-CM | POA: Diagnosis not present

## 2019-07-17 DIAGNOSIS — E119 Type 2 diabetes mellitus without complications: Secondary | ICD-10-CM | POA: Diagnosis not present

## 2019-07-17 DIAGNOSIS — K219 Gastro-esophageal reflux disease without esophagitis: Secondary | ICD-10-CM | POA: Diagnosis not present

## 2019-07-17 DIAGNOSIS — Z7984 Long term (current) use of oral hypoglycemic drugs: Secondary | ICD-10-CM | POA: Diagnosis not present

## 2019-07-17 DIAGNOSIS — G5603 Carpal tunnel syndrome, bilateral upper limbs: Secondary | ICD-10-CM | POA: Diagnosis not present

## 2019-07-17 DIAGNOSIS — G5602 Carpal tunnel syndrome, left upper limb: Secondary | ICD-10-CM | POA: Diagnosis not present

## 2019-07-17 DIAGNOSIS — Z794 Long term (current) use of insulin: Secondary | ICD-10-CM | POA: Diagnosis not present

## 2019-07-27 DIAGNOSIS — G5602 Carpal tunnel syndrome, left upper limb: Secondary | ICD-10-CM | POA: Diagnosis not present

## 2019-09-07 DIAGNOSIS — H40013 Open angle with borderline findings, low risk, bilateral: Secondary | ICD-10-CM | POA: Diagnosis not present

## 2019-09-07 DIAGNOSIS — E782 Mixed hyperlipidemia: Secondary | ICD-10-CM | POA: Diagnosis not present

## 2019-09-07 DIAGNOSIS — E1121 Type 2 diabetes mellitus with diabetic nephropathy: Secondary | ICD-10-CM | POA: Diagnosis not present

## 2019-09-07 DIAGNOSIS — I10 Essential (primary) hypertension: Secondary | ICD-10-CM | POA: Diagnosis not present

## 2019-09-09 DIAGNOSIS — I10 Essential (primary) hypertension: Secondary | ICD-10-CM | POA: Diagnosis not present

## 2019-09-09 DIAGNOSIS — Z6841 Body Mass Index (BMI) 40.0 and over, adult: Secondary | ICD-10-CM | POA: Diagnosis not present

## 2019-09-09 DIAGNOSIS — E1121 Type 2 diabetes mellitus with diabetic nephropathy: Secondary | ICD-10-CM | POA: Diagnosis not present

## 2019-09-09 DIAGNOSIS — Z794 Long term (current) use of insulin: Secondary | ICD-10-CM | POA: Diagnosis not present

## 2019-09-09 DIAGNOSIS — E782 Mixed hyperlipidemia: Secondary | ICD-10-CM | POA: Diagnosis not present

## 2019-09-09 DIAGNOSIS — G4733 Obstructive sleep apnea (adult) (pediatric): Secondary | ICD-10-CM | POA: Diagnosis not present

## 2019-09-15 DIAGNOSIS — Z0000 Encounter for general adult medical examination without abnormal findings: Secondary | ICD-10-CM | POA: Diagnosis not present

## 2019-09-21 DIAGNOSIS — M25511 Pain in right shoulder: Secondary | ICD-10-CM | POA: Diagnosis not present

## 2019-09-23 DIAGNOSIS — E1169 Type 2 diabetes mellitus with other specified complication: Secondary | ICD-10-CM | POA: Diagnosis not present

## 2019-09-23 DIAGNOSIS — R309 Painful micturition, unspecified: Secondary | ICD-10-CM | POA: Diagnosis not present

## 2019-09-23 DIAGNOSIS — Z6841 Body Mass Index (BMI) 40.0 and over, adult: Secondary | ICD-10-CM | POA: Diagnosis not present

## 2019-09-23 DIAGNOSIS — E669 Obesity, unspecified: Secondary | ICD-10-CM | POA: Diagnosis not present

## 2019-09-23 DIAGNOSIS — G4733 Obstructive sleep apnea (adult) (pediatric): Secondary | ICD-10-CM | POA: Diagnosis not present

## 2019-09-23 DIAGNOSIS — R809 Proteinuria, unspecified: Secondary | ICD-10-CM | POA: Diagnosis not present

## 2019-10-09 DIAGNOSIS — Z1231 Encounter for screening mammogram for malignant neoplasm of breast: Secondary | ICD-10-CM | POA: Diagnosis not present

## 2019-10-09 LAB — HM MAMMOGRAPHY

## 2019-10-22 ENCOUNTER — Other Ambulatory Visit: Payer: Self-pay | Admitting: Family Medicine

## 2019-10-29 ENCOUNTER — Telehealth (INDEPENDENT_AMBULATORY_CARE_PROVIDER_SITE_OTHER): Payer: Medicare HMO | Admitting: Family Medicine

## 2019-10-29 ENCOUNTER — Encounter: Payer: Self-pay | Admitting: Family Medicine

## 2019-10-29 DIAGNOSIS — R05 Cough: Secondary | ICD-10-CM | POA: Diagnosis not present

## 2019-10-29 DIAGNOSIS — R519 Headache, unspecified: Secondary | ICD-10-CM | POA: Diagnosis not present

## 2019-10-29 DIAGNOSIS — R059 Cough, unspecified: Secondary | ICD-10-CM | POA: Insufficient documentation

## 2019-10-29 DIAGNOSIS — Z87891 Personal history of nicotine dependence: Secondary | ICD-10-CM

## 2019-10-29 DIAGNOSIS — H9202 Otalgia, left ear: Secondary | ICD-10-CM | POA: Diagnosis not present

## 2019-10-29 DIAGNOSIS — J0181 Other acute recurrent sinusitis: Secondary | ICD-10-CM | POA: Insufficient documentation

## 2019-10-29 MED ORDER — AMOXICILLIN-POT CLAVULANATE 875-125 MG PO TABS: 1.0000 | ORAL_TABLET | Freq: Two times a day (BID) | ORAL | 0 refills | Status: DC

## 2019-10-29 NOTE — Patient Instructions (Addendum)
Patient was prescribed Augmentin 875 1 p.o. twice daily for 10 days.  She is recommended to rest and try to avoid sugary foods.  Patient is to come in tomorrow morning for a Covid rapid test followed by a Covid PCR test if the rapid test is negative.  Patient is to isolate from her family and not go to work until her testing is completed.   COVID-19 COVID-19 is a respiratory infection that is caused by a virus called severe acute respiratory syndrome coronavirus 2 (SARS-CoV-2). The disease is also known as coronavirus disease or novel coronavirus. In some people, the virus may not cause any symptoms. In others, it may cause a serious infection. The infection can get worse quickly and can lead to complications, such as:  Pneumonia, or infection of the lungs.  Acute respiratory distress syndrome or ARDS. This is a condition in which fluid build-up in the lungs prevents the lungs from filling with air and passing oxygen into the blood.  Acute respiratory failure. This is a condition in which there is not enough oxygen passing from the lungs to the body or when carbon dioxide is not passing from the lungs out of the body.  Sepsis or septic shock. This is a serious bodily reaction to an infection.  Blood clotting problems.  Secondary infections due to bacteria or fungus.  Organ failure. This is when your body's organs stop working. The virus that causes COVID-19 is contagious. This means that it can spread from person to person through droplets from coughs and sneezes (respiratory secretions). What are the causes? This illness is caused by a virus. You may catch the virus by:  Breathing in droplets from an infected person. Droplets can be spread by a person breathing, speaking, singing, coughing, or sneezing.  Touching something, like a table or a doorknob, that was exposed to the virus (contaminated) and then touching your mouth, nose, or eyes. What increases the risk? Risk for infection You  are more likely to be infected with this virus if you:  Are within 6 feet (2 meters) of a person with COVID-19.  Provide care for or live with a person who is infected with COVID-19.  Spend time in crowded indoor spaces or live in shared housing. Risk for serious illness You are more likely to become seriously ill from the virus if you:  Are 39 years of age or older. The higher your age, the more you are at risk for serious illness.  Live in a nursing home or long-term care facility.  Have cancer.  Have a long-term (chronic) disease such as: ? Chronic lung disease, including chronic obstructive pulmonary disease or asthma. ? A long-term disease that lowers your body's ability to fight infection (immunocompromised). ? Heart disease, including heart failure, a condition in which the arteries that lead to the heart become narrow or blocked (coronary artery disease), a disease which makes the heart muscle thick, weak, or stiff (cardiomyopathy). ? Diabetes. ? Chronic kidney disease. ? Sickle cell disease, a condition in which red blood cells have an abnormal "sickle" shape. ? Liver disease.  Are obese. What are the signs or symptoms? Symptoms of this condition can range from mild to severe. Symptoms may appear any time from 2 to 14 days after being exposed to the virus. They include:  A fever or chills.  A cough.  Difficulty breathing.  Headaches, body aches, or muscle aches.  Runny or stuffy (congested) nose.  A sore throat.  New loss of  taste or smell. Some people may also have stomach problems, such as nausea, vomiting, or diarrhea. Other people may not have any symptoms of COVID-19. How is this diagnosed? This condition may be diagnosed based on:  Your signs and symptoms, especially if: ? You live in an area with a COVID-19 outbreak. ? You recently traveled to or from an area where the virus is common. ? You provide care for or live with a person who was diagnosed  with COVID-19. ? You were exposed to a person who was diagnosed with COVID-19.  A physical exam.  Lab tests, which may include: ? Taking a sample of fluid from the back of your nose and throat (nasopharyngeal fluid), your nose, or your throat using a swab. ? A sample of mucus from your lungs (sputum). ? Blood tests.  Imaging tests, which may include, X-rays, CT scan, or ultrasound. How is this treated? At present, there is no medicine to treat COVID-19. Medicines that treat other diseases are being used on a trial basis to see if they are effective against COVID-19. Your health care provider will talk with you about ways to treat your symptoms. For most people, the infection is mild and can be managed at home with rest, fluids, and over-the-counter medicines. Treatment for a serious infection usually takes places in a hospital intensive care unit (ICU). It may include one or more of the following treatments. These treatments are given until your symptoms improve.  Receiving fluids and medicines through an IV.  Supplemental oxygen. Extra oxygen is given through a tube in the nose, a face mask, or a hood.  Positioning you to lie on your stomach (prone position). This makes it easier for oxygen to get into the lungs.  Continuous positive airway pressure (CPAP) or bi-level positive airway pressure (BPAP) machine. This treatment uses mild air pressure to keep the airways open. A tube that is connected to a motor delivers oxygen to the body.  Ventilator. This treatment moves air into and out of the lungs by using a tube that is placed in your windpipe.  Tracheostomy. This is a procedure to create a hole in the neck so that a breathing tube can be inserted.  Extracorporeal membrane oxygenation (ECMO). This procedure gives the lungs a chance to recover by taking over the functions of the heart and lungs. It supplies oxygen to the body and removes carbon dioxide. Follow these instructions at  home: Lifestyle  If you are sick, stay home except to get medical care. Your health care provider will tell you how long to stay home. Call your health care provider before you go for medical care.  Rest at home as told by your health care provider.  Do not use any products that contain nicotine or tobacco, such as cigarettes, e-cigarettes, and chewing tobacco. If you need help quitting, ask your health care provider.  Return to your normal activities as told by your health care provider. Ask your health care provider what activities are safe for you. General instructions  Take over-the-counter and prescription medicines only as told by your health care provider.  Drink enough fluid to keep your urine pale yellow.  Keep all follow-up visits as told by your health care provider. This is important. How is this prevented?  There is no vaccine to help prevent COVID-19 infection. However, there are steps you can take to protect yourself and others from this virus. To protect yourself:   Do not travel to areas where COVID-19  is a risk. The areas where COVID-19 is reported change often. To identify high-risk areas and travel restrictions, check the CDC travel website: FatFares.com.br  If you live in, or must travel to, an area where COVID-19 is a risk, take precautions to avoid infection. ? Stay away from people who are sick. ? Wash your hands often with soap and water for 20 seconds. If soap and water are not available, use an alcohol-based hand sanitizer. ? Avoid touching your mouth, face, eyes, or nose. ? Avoid going out in public, follow guidance from your state and local health authorities. ? If you must go out in public, wear a cloth face covering or face mask. Make sure your mask covers your nose and mouth. ? Avoid crowded indoor spaces. Stay at least 6 feet (2 meters) away from others. ? Disinfect objects and surfaces that are frequently touched every day. This may  include:  Counters and tables.  Doorknobs and light switches.  Sinks and faucets.  Electronics, such as phones, remote controls, keyboards, computers, and tablets. To protect others: If you have symptoms of COVID-19, take steps to prevent the virus from spreading to others.  If you think you have a COVID-19 infection, contact your health care provider right away. Tell your health care team that you think you may have a COVID-19 infection.  Stay home. Leave your house only to seek medical care. Do not use public transport.  Do not travel while you are sick.  Wash your hands often with soap and water for 20 seconds. If soap and water are not available, use alcohol-based hand sanitizer.  Stay away from other members of your household. Let healthy household members care for children and pets, if possible. If you have to care for children or pets, wash your hands often and wear a mask. If possible, stay in your own room, separate from others. Use a different bathroom.  Make sure that all people in your household wash their hands well and often.  Cough or sneeze into a tissue or your sleeve or elbow. Do not cough or sneeze into your hand or into the air.  Wear a cloth face covering or face mask. Make sure your mask covers your nose and mouth. Where to find more information  Centers for Disease Control and Prevention: PurpleGadgets.be  World Health Organization: https://www.castaneda.info/ Contact a health care provider if:  You live in or have traveled to an area where COVID-19 is a risk and you have symptoms of the infection.  You have had contact with someone who has COVID-19 and you have symptoms of the infection. Get help right away if:  You have trouble breathing.  You have pain or pressure in your chest.  You have confusion.  You have bluish lips and fingernails.  You have difficulty waking from sleep.  You have symptoms that get  worse. These symptoms may represent a serious problem that is an emergency. Do not wait to see if the symptoms will go away. Get medical help right away. Call your local emergency services (911 in the [place] Do not drive yourself to the hospital. Let the emergency medical personnel know if you think you have COVID-19. Summary  COVID-19 is a respiratory infection that is caused by a virus. It is also known as coronavirus disease or novel coronavirus. It can cause serious infections, such as pneumonia, acute respiratory distress syndrome, acute respiratory failure, or sepsis.  The virus that causes COVID-19 is contagious. This means that it can  spread from person to person through droplets from breathing, speaking, singing, coughing, or sneezing.  You are more likely to develop a serious illness if you are 54 years of age or older, have a weak immune system, live in a nursing home, or have chronic disease.  There is no medicine to treat COVID-19. Your health care provider will talk with you about ways to treat your symptoms.  Take steps to protect yourself and others from infection. Wash your hands often and disinfect objects and surfaces that are frequently touched every day. Stay away from people who are sick and wear a mask if you are sick. This information is not intended to replace advice given to you by your health care provider. Make sure you discuss any questions you have with your health care provider. Document Revised: 07/03/2019 Document Reviewed: 10/09/2018 Elsevier Patient Education  [place].

## 2019-10-29 NOTE — Progress Notes (Signed)
Acute Office Visit  Subjective:    Patient ID: Brandi Velazquez, female    DOB: 03/21/1961, 59 y.o.   MRN: [id number]  Chief Complaint  Patient presents with  . Cough    HPI Patient presents via virtial visit complaining of cough.  Denies, chills, sweats, nasal congestion.  The patient would like her dentist for infected wisdom teeth.  She had swelling of the left side of her face and left earache prior to receiving the antibiotics.  She has completed the clindamycin which did help only minimal left-sided facial pain and left earache.  However her cough has worsened.  No known Covid exposure.  Her symptoms began fully about 2 weeks ago.  ROS: Denies chest pain or shortness of breath.  Mild nasal congestion present.  Patient has been eating high carbohydrate foods due to her tooth pain and so had stopped checking her sugars.   Past Medical History:  Diagnosis Date  . Ankle pain   . Chronic pain    back  . COPD (chronic obstructive pulmonary disease) ([place])   . Depression   . Diabetes mellitus   . GERD (gastroesophageal reflux disease)   . Hyperlipidemia   . Hypertension   . Low back pain   . Obstructive sleep apnea   . Pneumonia 2010  . Post-menopausal atrophic vaginitis   . Superficial phlebitis     Past Surgical History:  Procedure Laterality Date  . BACK SURGERY  1990  . CESAREAN SECTION    . CYST REMOVAL HAND     inner thigh  . SPINE SURGERY     lumbar laminectomy and diskectomy    Family History  Problem Relation Age of Onset  . Diabetes Mother   . Heart disease Mother   . Cancer Father   . Stroke Sister     Social History   Socioeconomic History  . Marital status: Unknown    Spouse name: Not on file  . Number of children: Not on file  . Years of education: Not on file  . Highest education level: Not on file  Occupational History  . Not on file  Tobacco Use  . Smoking status: Former Smoker    Quit date: 03/20/2009    Years since quitting: 10.6  .  Smokeless tobacco: Never Used  Substance and Sexual Activity  . Alcohol use: No  . Drug use: No  . Sexual activity: Not on file  Other Topics Concern  . Not on file  Social History Narrative  . Not on file   Social Determinants of Health   Financial Resource Strain:   . Difficulty of Paying Living Expenses: Not on file  Food Insecurity:   . Worried About Charity fundraiser in the Last Year: Not on file  . Ran Out of Food in the Last Year: Not on file  Transportation Needs:   . Lack of Transportation (Medical): Not on file  . Lack of Transportation (Non-Medical): Not on file  Physical Activity:   . Days of Exercise per Week: Not on file  . Minutes of Exercise per Session: Not on file  Stress:   . Feeling of Stress : Not on file  Social Connections:   . Frequency of Communication with Friends and Family: Not on file  . Frequency of Social Gatherings with Friends and Family: Not on file  . Attends Religious Services: Not on file  . Active Member of Clubs or Organizations: Not on file  . Attends Club  or Organization Meetings: Not on file  . Marital Status: Not on file  Intimate Partner Violence:   . Fear of Current or Ex-Partner: Not on file  . Emotionally Abused: Not on file  . Physically Abused: Not on file  . Sexually Abused: Not on file    Outpatient Medications Prior to Visit  Medication Sig Dispense Refill  . aspirin EC 81 MG tablet Take 81 mg by mouth daily.    Marland Kitchen BLACK COHOSH EXTRACT PO Take by mouth daily.    Marland Kitchen glimepiride (AMARYL) 4 MG tablet TAKE 1 TABLET BY MOUTH TWICE DAILY BEFORE BREAKFAST AND WITH SUPPER 180 tablet 0  . JANUMET 50-1000 MG tablet Take 1 twice daily    . Multiple Vitamin (MULTIVITAMIN) tablet Take 1 tablet by mouth daily.    Marland Kitchen NOVOLIN 70/30 RELION (70-30) 100 UNIT/ML injection Inject 45 units subcutaneously before breakfast and 50 units before supper    . pioglitazone (ACTOS) 30 MG tablet Take 30 mg by mouth daily.      . pravastatin  (PRAVACHOL) 40 MG tablet Take 1 daily    . ramipril (ALTACE) 2.5 MG capsule Take 1 daily    . spironolactone (ALDACTONE) 25 MG tablet Take 1 daily    . traMADol (ULTRAM) 50 MG tablet Take 1 - 2 tablets every 6 hours as needed for pain    . aspirin 325 MG tablet Take 325 mg by mouth daily.      . furosemide (LASIX) 20 MG tablet Take 20 mg by mouth daily.      . insulin detemir (LEVEMIR) 100 UNIT/ML injection Inject 35 Units into the skin at bedtime.      . insulin glargine (LANTUS) 100 UNIT/ML injection Inject 75 Units into the skin at bedtime.      Marland Kitchen lisinopril (PRINIVIL,ZESTRIL) 5 MG tablet Take 5 mg by mouth daily.      . metFORMIN (GLUMETZA) 1000 MG (MOD) 24 hr tablet Take 1,000 mg by mouth daily with breakfast.      . pravastatin (PRAVACHOL) 10 MG tablet Take 20 mg by mouth daily.      No facility-administered medications prior to visit.    Allergies  Allergen Reactions  . Bactrim [Sulfamethoxazole-Trimethoprim] Swelling  . Morphine And Related     Review of Systems     Objective:    Physical Exam: Patient is unable to take vital signs.  Patient coughs throughout the appointment, she also sounds congested with her speech. There were no vitals taken for this visit. Wt Readings from Last 3 Encounters:  09/25/11 265 lb (120.2 kg)  06/12/11 265 lb (120.2 kg)  05/22/11 262 lb (118.8 kg)    Health Maintenance Due  Topic Date Due  . Hepatitis C Screening  1961/05/11  . HIV Screening  03/25/1975  . TETANUS/TDAP  03/25/1979  . PAP SMEAR-Modifier  03/24/1981  . MAMMOGRAM  03/24/2010  . COLONOSCOPY  03/24/2010    There are no preventive care reminders to display for this patient.   No results found for: TSH No results found for: WBC, HGB, HCT, MCV, PLT No results found for: NA, K, CHLORIDE, CO2, GLUCOSE, BUN, CREATININE, BILITOT, ALKPHOS, AST, ALT, PROT, ALBUMIN, CALCIUM, ANIONGAP, EGFR, GFR No results found for: CHOL No results found for: HDL No results found for:  LDLCALC No results found for: TRIG No results found for: CHOLHDL No results found for: HGBA1C     Assessment & Plan:   Problem List Items Addressed This Visit  None       Meds ordered this encounter  Medications  . amoxicillin-clavulanate (AUGMENTIN) 875-125 MG tablet    Sig: Take 1 tablet by mouth 2 (two) times daily.    Dispense:  20 tablet    Refill:  0     [name], MD

## 2019-10-30 ENCOUNTER — Ambulatory Visit (INDEPENDENT_AMBULATORY_CARE_PROVIDER_SITE_OTHER): Payer: Medicare HMO

## 2019-10-30 ENCOUNTER — Other Ambulatory Visit: Payer: Self-pay

## 2019-10-30 ENCOUNTER — Telehealth: Payer: Self-pay

## 2019-10-30 DIAGNOSIS — R05 Cough: Secondary | ICD-10-CM | POA: Diagnosis not present

## 2019-10-30 DIAGNOSIS — J0181 Other acute recurrent sinusitis: Secondary | ICD-10-CM

## 2019-10-30 DIAGNOSIS — R059 Cough, unspecified: Secondary | ICD-10-CM

## 2019-10-30 LAB — POC COVID19 BINAXNOW: SARS Coronavirus 2 Ag: NEGATIVE

## 2019-10-30 NOTE — Patient Instructions (Signed)

## 2019-10-30 NOTE — Telephone Encounter (Signed)
Called patient per Dr. Tobie Poet left voicemail informing her that her Covid test was negative.

## 2019-10-30 NOTE — Progress Notes (Signed)
Patient was tested from the car for COVID-19.  The rapid test was Negative and the PCR test was sent to [facility].

## 2019-10-31 LAB — NOVEL CORONAVIRUS, NAA: SARS-CoV-2, NAA: NOT DETECTED

## 2019-11-10 ENCOUNTER — Other Ambulatory Visit: Payer: Self-pay | Admitting: Family Medicine

## 2019-11-25 NOTE — Addendum Note (Signed)
Addended by: Erie Noe on: 11/25/2019 08:05 AM   Modules accepted: Level of Service

## 2019-12-15 ENCOUNTER — Other Ambulatory Visit: Payer: Self-pay | Admitting: Family Medicine

## 2019-12-23 ENCOUNTER — Other Ambulatory Visit: Payer: Self-pay | Admitting: Family Medicine

## 2020-01-04 ENCOUNTER — Other Ambulatory Visit: Payer: Self-pay

## 2020-01-04 ENCOUNTER — Encounter: Payer: Self-pay | Admitting: Family Medicine

## 2020-01-04 ENCOUNTER — Ambulatory Visit (INDEPENDENT_AMBULATORY_CARE_PROVIDER_SITE_OTHER): Payer: Medicare HMO | Admitting: Family Medicine

## 2020-01-04 VITALS — BP 132/70 | HR 80 | Temp 97.9°F | Resp 16 | Ht 63.0 in | Wt 265.8 lb

## 2020-01-04 DIAGNOSIS — M545 Low back pain, unspecified: Secondary | ICD-10-CM

## 2020-01-04 DIAGNOSIS — J449 Chronic obstructive pulmonary disease, unspecified: Secondary | ICD-10-CM | POA: Diagnosis not present

## 2020-01-04 MED ORDER — HYDROCODONE-ACETAMINOPHEN 5-325 MG PO TABS: 1.0000 | ORAL_TABLET | Freq: Four times a day (QID) | ORAL | 0 refills | Status: DC | PRN

## 2020-01-04 MED ORDER — IBUPROFEN 800 MG PO TABS: 800.0000 mg | ORAL_TABLET | Freq: Three times a day (TID) | ORAL | 0 refills | Status: DC | PRN

## 2020-01-04 MED ORDER — BACLOFEN 10 MG PO TABS: 10.0000 mg | ORAL_TABLET | Freq: Three times a day (TID) | ORAL | 0 refills | Status: DC

## 2020-01-04 NOTE — Patient Instructions (Signed)
Lumbar back pain Start on baclofen 10 mg one three times a day for one week.  Start on ibuprofen 800 mg one three times a day for one week.  Vicodin 5/325 mg one four times a day as needed for severe back pain.   Acute Back Pain, Adult Acute back pain is sudden and usually short-lived. It is often caused by an injury to the muscles and tissues in the back. The injury may result from:  A muscle or ligament getting overstretched or torn (strained). Ligaments are tissues that connect bones to each other. Lifting something improperly can cause a back strain.  Wear and tear (degeneration) of the spinal disks. Spinal disks are circular tissue that provides cushioning between the bones of the spine (vertebrae).  Twisting motions, such as while playing sports or doing yard work.  A hit to the back.  Arthritis. You may have a physical exam, lab tests, and imaging tests to find the cause of your pain. Acute back pain usually goes away with rest and home care. Follow these instructions at home: Managing pain, stiffness, and swelling  Take over-the-counter and prescription medicines only as told by your health care provider.  Your health care provider may recommend applying ice during the first 24-48 hours after your pain starts. To do this: ? Put ice in a plastic bag. ? Place a towel between your skin and the bag. ? Leave the ice on for 20 minutes, 2-3 times a day.  If directed, apply heat to the affected area as often as told by your health care provider. Use the heat source that your health care provider recommends, such as a moist heat pack or a heating pad. ? Place a towel between your skin and the heat source. ? Leave the heat on for 20-30 minutes. ? Remove the heat if your skin turns bright red. This is especially important if you are unable to feel pain, heat, or cold. You have a greater risk of getting burned. Activity   Do not stay in bed. Staying in bed for more than 1-2 days can  delay your recovery.  Sit up and stand up straight. Avoid leaning forward when you sit, or hunching over when you stand. ? If you work at a desk, sit close to it so you do not need to lean over. Keep your chin tucked in. Keep your neck drawn back, and keep your elbows bent at a right angle. Your arms should look like the letter "L." ? Sit high and close to the steering wheel when you drive. Add lower back (lumbar) support to your car seat, if needed.  Take short walks on even surfaces as soon as you are able. Try to increase the length of time you walk each day.  Do not sit, drive, or stand in one place for more than 30 minutes at a time. Sitting or standing for long periods of time can put stress on your back.  Do not drive or use heavy machinery while taking prescription pain medicine.  Use proper lifting techniques. When you bend and lift, use positions that put less stress on your back: ? [place] your knees. ? Keep the load close to your body. ? Avoid twisting.  Exercise regularly as told by your health care provider. Exercising helps your back heal faster and helps prevent back injuries by keeping muscles strong and flexible.  Work with a physical therapist to make a safe exercise program, as recommended by your health care provider.  Do any exercises as told by your physical therapist. Lifestyle  Maintain a healthy weight. Extra weight puts stress on your back and makes it difficult to have good posture.  Avoid activities or situations that make you feel anxious or stressed. Stress and anxiety increase muscle tension and can make back pain worse. Learn ways to manage anxiety and stress, such as through exercise. General instructions  Sleep on a firm mattress in a comfortable position. Try lying on your side with your knees slightly bent. If you lie on your back, put a pillow under your knees.  Follow your treatment plan as told by your health care provider. This may  include: ? Cognitive or behavioral therapy. ? Acupuncture or massage therapy. ? Meditation or yoga. Contact a health care provider if:  You have pain that is not relieved with rest or medicine.  You have increasing pain going down into your legs or buttocks.  Your pain does not improve after 2 weeks.  You have pain at night.  You lose weight without trying.  You have a fever or chills. Get help right away if:  You develop new bowel or bladder control problems.  You have unusual weakness or numbness in your arms or legs.  You develop nausea or vomiting.  You develop abdominal pain.  You feel faint. Summary  Acute back pain is sudden and usually short-lived.  Use proper lifting techniques. When you bend and lift, use positions that put less stress on your back.  Take over-the-counter and prescription medicines and apply heat or ice as directed by your health care provider. This information is not intended to replace advice given to you by your health care provider. Make sure you discuss any questions you have with your health care provider. Document Revised: 12/23/2018 Document Reviewed: 04/17/2017 Elsevier Patient Education  [place].

## 2020-01-04 NOTE — Progress Notes (Signed)
Acute Office Visit  Subjective:    Patient ID: Brandi Velazquez, female    DOB: [birth year], 60 y.o.   MRN: [id number]  Chief Complaint  Patient presents with  . Back Pain    HPI Patient is in today for follow-up of acute back pain.  She awoke on Friday with severe left back pain.  She has a history of back surgery.  She has been taking ibuprofen OTC 600 mg 3 times daily.  She is to use heat.  She is taking a hot bath.  She is in severe pain which is preventing her from sleeping.  Any movement seems to cause pain.  Denies sciatica.  She feels a lump on the left side of her spine and lumbar region.  Past Medical History:  Diagnosis Date  . Ankle pain   . Chronic pain    back  . COPD (chronic obstructive pulmonary disease) ([place])   . Depression   . Diabetes mellitus   . GERD (gastroesophageal reflux disease)   . Hyperlipidemia   . Hypertension   . Low back pain   . Obstructive sleep apnea   . Pneumonia 2010  . Post-menopausal atrophic vaginitis   . Superficial phlebitis     Past Surgical History:  Procedure Laterality Date  . BACK SURGERY  1990  . CESAREAN SECTION    . CYST REMOVAL HAND     inner thigh  . SPINE SURGERY     lumbar laminectomy and diskectomy    Family History  Problem Relation Age of Onset  . Diabetes Mother   . Heart disease Mother   . Cancer Father   . Stroke Sister     Social History   Socioeconomic History  . Marital status: Single    Spouse name: Not on file  . Number of children: Not on file  . Years of education: Not on file  . Highest education level: Not on file  Occupational History  . Not on file  Tobacco Use  . Smoking status: Former Smoker    Quit date: 03/20/2009    Years since quitting: 10.8  . Smokeless tobacco: Never Used  Substance and Sexual Activity  . Alcohol use: No  . Drug use: No  . Sexual activity: Not on file  Other Topics Concern  . Not on file  Social History Narrative  . Not on file   Social Determinants  of Health   Financial Resource Strain:   . Difficulty of Paying Living Expenses:   Food Insecurity:   . Worried About Charity fundraiser in the Last Year:   . Arboriculturist in the Last Year:   Transportation Needs:   . Film/video editor (Medical):   Marland Kitchen Lack of Transportation (Non-Medical):   Physical Activity:   . Days of Exercise per Week:   . Minutes of Exercise per Session:   Stress:   . Feeling of Stress :   Social Connections:   . Frequency of Communication with Friends and Family:   . Frequency of Social Gatherings with Friends and Family:   . Attends Religious Services:   . Active Member of Clubs or Organizations:   . Attends Archivist Meetings:   Marland Kitchen Marital Status:   Intimate Partner Violence:   . Fear of Current or Ex-Partner:   . Emotionally Abused:   Marland Kitchen Physically Abused:   . Sexually Abused:     Outpatient Medications Prior to Visit  Medication Sig Dispense  Refill  . aspirin EC 81 MG tablet Take 81 mg by mouth daily.    Marland Kitchen BLACK COHOSH EXTRACT PO Take by mouth daily.    Marland Kitchen glimepiride (AMARYL) 4 MG tablet TAKE 1 TABLET BY MOUTH TWICE DAILY BEFORE BREAKFAST AND WITH SUPPER 180 tablet 0  . JANUMET 50-1000 MG tablet Take 1 twice daily    . Multiple Vitamin (MULTIVITAMIN) tablet Take 1 tablet by mouth daily.    Marland Kitchen NOVOLIN 70/30 RELION (70-30) 100 UNIT/ML injection INJECT 45 UNITS SUBCUTANEOUSLY BEFORE BREAKFAST AND 50 UNITS BEFORE SUPPER (Patient taking differently: Inject 40 units subcutaneously before breakfast and 50 units before supper) 30 mL 0  . pioglitazone (ACTOS) 30 MG tablet Take 30 mg by mouth daily.      . pravastatin (PRAVACHOL) 40 MG tablet Take 1 daily    . ramipril (ALTACE) 2.5 MG capsule Take 1 capsule by mouth once daily 90 capsule 0  . spironolactone (ALDACTONE) 25 MG tablet Take 1 daily    . traMADol (ULTRAM) 50 MG tablet Take 1 - 2 tablets every 6 hours as needed for pain    . amoxicillin-clavulanate (AUGMENTIN) 875-125 MG tablet  Take 1 tablet by mouth 2 (two) times daily. 20 tablet 0   No facility-administered medications prior to visit.    Allergies  Allergen Reactions  . Bactrim [Sulfamethoxazole-Trimethoprim] Swelling  . Morphine And Related     Review of Systems  Constitutional: Negative for chills, fatigue and fever.  HENT: Negative for congestion, ear pain and sore throat.   Respiratory: Negative for cough and shortness of breath.   Cardiovascular: Negative for chest pain.  Gastrointestinal: Negative for abdominal pain, constipation, diarrhea, nausea and vomiting.  Genitourinary: Negative for dysuria, hematuria and urgency.  Musculoskeletal: Positive for back pain.  Psychiatric/Behavioral: Negative for dysphoric mood. The patient is not nervous/anxious.        Objective:    Physical Exam Constitutional:      Appearance: Normal appearance. She is obese.  Cardiovascular:     Rate and Rhythm: Normal rate and regular rhythm.  Pulmonary:     Effort: Pulmonary effort is normal. No respiratory distress.     Breath sounds: Normal breath sounds.  Musculoskeletal:       Back:  Neurological:     Mental Status: She is alert.     BP 132/70   Pulse 80   Temp 97.9 F (36.6 C)   Resp 16   Ht '5\' 3"'$  (1.6 m)   Wt 265 lb 12.8 oz (120.6 kg)   BMI 47.08 kg/m  Wt Readings from Last 3 Encounters:  01/04/20 265 lb 12.8 oz (120.6 kg)  09/25/11 265 lb (120.2 kg)  06/12/11 265 lb (120.2 kg)    Health Maintenance Due  Topic Date Due  . Hepatitis C Screening  Never done  . HIV Screening  Never done  . COVID-19 Vaccine (1) Never done  . TETANUS/TDAP  Never done  . PAP SMEAR-Modifier  Never done  . MAMMOGRAM  Never done  . COLONOSCOPY  Never done    There are no preventive care reminders to display for this patient.   No results found for: TSH No results found for: WBC, HGB, HCT, MCV, PLT No results found for: NA, K, CHLORIDE, CO2, GLUCOSE, BUN, CREATININE, BILITOT, ALKPHOS, AST, ALT, PROT,  ALBUMIN, CALCIUM, ANIONGAP, EGFR, GFR No results found for: CHOL No results found for: HDL No results found for: LDLCALC No results found for: TRIG No results found for: CHOLHDL  No results found for: HGBA1C     Assessment & Plan:  1. Acute left-sided low back pain without sciatica Start on baclofen 10 mg one three times a day for one week.  Start on ibuprofen 800 mg one three times a day for one week.  Vicodin 5/325 mg one four times a day as needed for severe back pain. Education given.  Note off work for one week.   Follow up on Thursday if not improving.   [name], MD

## 2020-01-06 NOTE — Progress Notes (Signed)
Established Patient Office Visit  Subjective:  Patient ID: Brandi Velazquez, female    DOB: 1960/11/12  Age: 59 y.o. MRN: [id number]  CC:  Chief Complaint  Patient presents with  . Back Pain    3 day f/u. Baclofen and Ibuprofen have helped with back pain. Patient states she has not taken much of the vicodin.    HPI Brandi Velazquez presents for follow up for back pain and sciatica. Baclofen has helped the most. It does not make her sleepy. Ibuprofen 800 mg one three times a day. Doing stretching exercises. No sciatica initially, but now have burning/numbness in left leg. Feels she can return to work tomorrow.  Past Medical History:  Diagnosis Date  . Ankle pain   . Chronic pain    back  . COPD (chronic obstructive pulmonary disease) ([place])   . Depression   . Diabetes mellitus   . GERD (gastroesophageal reflux disease)   . Hyperlipidemia   . Hypertension   . Low back pain   . Obstructive sleep apnea   . Pneumonia 2010  . Post-menopausal atrophic vaginitis   . Superficial phlebitis     Past Surgical History:  Procedure Laterality Date  . BACK SURGERY  1990  . CESAREAN SECTION    . CYST REMOVAL HAND     inner thigh  . SPINE SURGERY     lumbar laminectomy and diskectomy    Family History  Problem Relation Age of Onset  . Diabetes Mother   . Heart disease Mother   . Cancer Father   . Stroke Sister     Social History   Socioeconomic History  . Marital status: Single    Spouse name: Not on file  . Number of children: Not on file  . Years of education: Not on file  . Highest education level: Not on file  Occupational History  . Not on file  Tobacco Use  . Smoking status: Former Smoker    Quit date: 03/20/2009    Years since quitting: 10.8  . Smokeless tobacco: Never Used  Substance and Sexual Activity  . Alcohol use: No  . Drug use: No  . Sexual activity: Not on file  Other Topics Concern  . Not on file  Social History Narrative  . Not on file   Social  Determinants of Health   Financial Resource Strain:   . Difficulty of Paying Living Expenses:   Food Insecurity:   . Worried About Charity fundraiser in the Last Year:   . Arboriculturist in the Last Year:   Transportation Needs:   . Film/video editor (Medical):   Marland Kitchen Lack of Transportation (Non-Medical):   Physical Activity:   . Days of Exercise per Week:   . Minutes of Exercise per Session:   Stress:   . Feeling of Stress :   Social Connections:   . Frequency of Communication with Friends and Family:   . Frequency of Social Gatherings with Friends and Family:   . Attends Religious Services:   . Active Member of Clubs or Organizations:   . Attends Archivist Meetings:   Marland Kitchen Marital Status:   Intimate Partner Violence:   . Fear of Current or Ex-Partner:   . Emotionally Abused:   Marland Kitchen Physically Abused:   . Sexually Abused:     Outpatient Medications Prior to Visit  Medication Sig Dispense Refill  . aspirin EC 81 MG tablet Take 81 mg by mouth daily.    Marland Kitchen  baclofen (LIORESAL) 10 MG tablet Take 1 tablet (10 mg total) by mouth 3 (three) times daily. 60 each 0  . BLACK COHOSH EXTRACT PO Take by mouth daily.    Marland Kitchen glimepiride (AMARYL) 4 MG tablet TAKE 1 TABLET BY MOUTH TWICE DAILY BEFORE BREAKFAST AND WITH SUPPER 180 tablet 0  . HYDROcodone-acetaminophen (NORCO/VICODIN) 5-325 MG tablet Take 1 tablet by mouth every 6 (six) hours as needed for moderate pain. 30 tablet 0  . ibuprofen (ADVIL) 800 MG tablet Take 1 tablet (800 mg total) by mouth every 8 (eight) hours as needed. 30 tablet 0  . JANUMET 50-1000 MG tablet Take 1 twice daily    . Multiple Vitamin (MULTIVITAMIN) tablet Take 1 tablet by mouth daily.    Marland Kitchen NOVOLIN 70/30 RELION (70-30) 100 UNIT/ML injection INJECT 45 UNITS SUBCUTANEOUSLY BEFORE BREAKFAST AND 50 UNITS BEFORE SUPPER (Patient taking differently: Inject 40 units subcutaneously before breakfast and 50 units before supper) 30 mL 0  . pioglitazone (ACTOS) 30 MG  tablet Take 30 mg by mouth daily.      . pravastatin (PRAVACHOL) 40 MG tablet Take 1 daily    . ramipril (ALTACE) 2.5 MG capsule Take 1 capsule by mouth once daily 90 capsule 0  . spironolactone (ALDACTONE) 25 MG tablet Take 1 daily    . traMADol (ULTRAM) 50 MG tablet Take 1 - 2 tablets every 6 hours as needed for pain     No facility-administered medications prior to visit.    Allergies  Allergen Reactions  . Bactrim [Sulfamethoxazole-Trimethoprim] Swelling  . Morphine And Related     ROS Review of Systems  Constitutional: Negative for chills, fatigue and fever.  HENT: Negative for congestion, ear pain, rhinorrhea and sore throat.   Respiratory: Negative for cough and shortness of breath.   Cardiovascular: Negative for chest pain.  Gastrointestinal: Negative for abdominal pain, constipation, diarrhea, nausea and vomiting.  Genitourinary: Negative for dysuria and urgency.  Musculoskeletal: Positive for arthralgias, back pain and myalgias.  Neurological: Negative for dizziness, weakness, light-headedness and headaches.  Psychiatric/Behavioral: Negative for dysphoric mood. The patient is not nervous/anxious.       Objective:    Physical Exam  Constitutional: She is oriented to person, place, and time. She appears well-developed and well-nourished.  HENT:  Right Ear: External ear normal.  Left Ear: External ear normal.  Nose: Nose normal.  Cardiovascular: Normal rate, regular rhythm and normal heart sounds.  Pulmonary/Chest: Effort normal and breath sounds normal.  Musculoskeletal:        General: Tenderness present. Normal range of motion.     Cervical back: Normal range of motion.     Comments: Left lower back tender. Spasm improved. Negative SLR BL, But pt says her left lateral thigh burns.   Neurological: She is alert and oriented to person, place, and time.  Skin: Skin is warm.  Psychiatric: She has a normal mood and affect. Her behavior is normal.    BP 118/64    Pulse 93   Temp (!) 96.9 F (36.1 C)   Ht 5' 3"  (1.6 m)   Wt 268 lb (121.6 kg)   SpO2 100%   BMI 47.47 kg/m  Wt Readings from Last 3 Encounters:  01/07/20 268 lb (121.6 kg)  01/04/20 265 lb 12.8 oz (120.6 kg)  09/25/11 265 lb (120.2 kg)     Health Maintenance Due  Topic Date Due  . Hepatitis C Screening  Never done  . HIV Screening  Never done  . COVID-19  Vaccine (1) Never done  . TETANUS/TDAP  Never done  . PAP SMEAR-Modifier  Never done  . MAMMOGRAM  Never done  . COLONOSCOPY  Never done    There are no preventive care reminders to display for this patient.  No results found for: TSH No results found for: WBC, HGB, HCT, MCV, PLT No results found for: NA, K, CHLORIDE, CO2, GLUCOSE, BUN, CREATININE, BILITOT, ALKPHOS, AST, ALT, PROT, ALBUMIN, CALCIUM, ANIONGAP, EGFR, GFR No results found for: CHOL No results found for: HDL No results found for: LDLCALC No results found for: TRIG No results found for: CHOLHDL No results found for: HGBA1C    Assessment & Plan:  1. Back pain of lumbar region with sciatica Prednisone burst.  May take increase insulin by 5 U in am and pm if sugars over 200.  Continue baclofen. Continue ibuprofen.  Follow-up: Return if symptoms worsen or fail to improve. May return to work tomorrow.   [name], MD

## 2020-01-07 ENCOUNTER — Other Ambulatory Visit: Payer: Self-pay

## 2020-01-07 ENCOUNTER — Ambulatory Visit (INDEPENDENT_AMBULATORY_CARE_PROVIDER_SITE_OTHER): Payer: Medicare HMO | Admitting: Family Medicine

## 2020-01-07 ENCOUNTER — Encounter: Payer: Self-pay | Admitting: Family Medicine

## 2020-01-07 VITALS — BP 118/64 | HR 93 | Temp 96.9°F | Ht 63.0 in | Wt 268.0 lb

## 2020-01-07 DIAGNOSIS — M5440 Lumbago with sciatica, unspecified side: Secondary | ICD-10-CM

## 2020-01-07 HISTORY — DX: Lumbago with sciatica, unspecified side: M54.40

## 2020-01-07 MED ORDER — PREDNISONE 50 MG PO TABS: 50.0000 mg | ORAL_TABLET | Freq: Every day | ORAL | 0 refills | Status: DC

## 2020-01-07 NOTE — Patient Instructions (Addendum)
Prednisone burst.  May take increase insulin by 5 U in am and pm if sugars over 200.  Continue baclofen. HOld ibuprofen, while on prednisone.

## 2020-01-10 ENCOUNTER — Encounter: Payer: Self-pay | Admitting: Family Medicine

## 2020-01-10 DIAGNOSIS — M545 Low back pain, unspecified: Secondary | ICD-10-CM | POA: Insufficient documentation

## 2020-01-10 DIAGNOSIS — G8929 Other chronic pain: Secondary | ICD-10-CM | POA: Insufficient documentation

## 2020-01-11 ENCOUNTER — Other Ambulatory Visit: Payer: Self-pay | Admitting: Family Medicine

## 2020-01-17 ENCOUNTER — Other Ambulatory Visit: Payer: Self-pay | Admitting: Family Medicine

## 2020-01-22 ENCOUNTER — Other Ambulatory Visit: Payer: Self-pay | Admitting: Family Medicine

## 2020-02-19 ENCOUNTER — Other Ambulatory Visit: Payer: Self-pay | Admitting: Physician Assistant

## 2020-03-03 ENCOUNTER — Other Ambulatory Visit: Payer: Self-pay | Admitting: Family Medicine

## 2020-03-04 DIAGNOSIS — H40013 Open angle with borderline findings, low risk, bilateral: Secondary | ICD-10-CM | POA: Diagnosis not present

## 2020-03-04 DIAGNOSIS — E119 Type 2 diabetes mellitus without complications: Secondary | ICD-10-CM | POA: Diagnosis not present

## 2020-03-07 LAB — HM DIABETES EYE EXAM

## 2020-03-09 ENCOUNTER — Ambulatory Visit: Payer: Self-pay | Admitting: Family Medicine

## 2020-03-28 ENCOUNTER — Other Ambulatory Visit: Payer: Self-pay | Admitting: Family Medicine

## 2020-03-28 ENCOUNTER — Other Ambulatory Visit: Payer: Self-pay | Admitting: Physician Assistant

## 2020-04-14 ENCOUNTER — Other Ambulatory Visit: Payer: Self-pay | Admitting: Physician Assistant

## 2020-04-14 DIAGNOSIS — E785 Hyperlipidemia, unspecified: Secondary | ICD-10-CM | POA: Insufficient documentation

## 2020-04-14 DIAGNOSIS — K219 Gastro-esophageal reflux disease without esophagitis: Secondary | ICD-10-CM | POA: Insufficient documentation

## 2020-04-14 DIAGNOSIS — I10 Essential (primary) hypertension: Secondary | ICD-10-CM | POA: Insufficient documentation

## 2020-04-14 DIAGNOSIS — E1159 Type 2 diabetes mellitus with other circulatory complications: Secondary | ICD-10-CM | POA: Insufficient documentation

## 2020-04-14 NOTE — Progress Notes (Signed)
Subjective:  Patient ID: Brandi Velazquez, female    DOB: [birth year]  Age: 59 y.o. MRN: [id number]  Chief Complaint  Patient presents with  . Hypertension  . Diabetes  . Hyperlipidemia    HPI  [name] presents with a diagnosis of obstructive sleep apnea (adult) (pediatric).  Patient currrently wears cpap machine every night     Pt presents with hyperlipidemia.  Current treatment includes Pravachol.  Compliance with treatment has been good; she takes her medication as directed, maintains her low cholesterol diet, and follows up as directed.  She denies experiencing any hypercholesterolemia related symptoms.      Pt presents for follow up of hypertension.  Her current cardiac medication regimen includes a diuretic ( spironolactone ) and an ACE inhibitor ( ramipril ).  She is tolerating the medication well without side effects.  Compliance with treatment has been good; she takes her medication as directed and follows up as directed.      Type 2 diabetes mellitus with diabetic nephropathy details; specifically, this is type 2, insulin requiring diabetes.  Compliance with treatment has been good.  Patient's diabetes was first diagnosed > 20 years ago.  Primary symptoms reported include nerve dysfunction (manifested as numbness Hands ).  She specifically denies excessive thirst, excessively hunger or frequent urination.   Tobacco screen: past smoker.  Current meds include an oral hypoglycemic ( Actos, Amaryl, and Janumet ) and insulin/injectable ( Novolin 70/30 45 U in am and 50 U before supper. ).  She has not been checking her sugars. She needs a new glucometer. In regard to preventative care, she performs foot self-exams daily and her last ophthalmology exam was in 02/2020.    Current Outpatient Medications on File Prior to Visit  Medication Sig Dispense Refill  . aspirin EC 81 MG tablet Take 81 mg by mouth daily.    Marland Kitchen BLACK COHOSH EXTRACT PO Take by mouth daily.    Marland Kitchen glimepiride (AMARYL) 4 MG  tablet TAKE 1 TABLET BY MOUTH TWICE DAILY BEFORE BREAKFAST AND WITH SUPPER 180 tablet 0  . HYDROcodone-acetaminophen (NORCO/VICODIN) 5-325 MG tablet Take 1 tablet by mouth every 6 (six) hours as needed for moderate pain. 30 tablet 0  . ibuprofen (ADVIL) 800 MG tablet Take 1 tablet (800 mg total) by mouth every 8 (eight) hours as needed. 30 tablet 0  . Multiple Vitamin (MULTIVITAMIN) tablet Take 1 tablet by mouth daily.    Marland Kitchen NOVOLIN 70/30 RELION (70-30) 100 UNIT/ML injection INJECT 45 UNITS SUBCUTANEOUSLY BEFORE BREAKFAST AND 50 UNITS BEFORE SUPPER 30 mL 0  . pioglitazone (ACTOS) 30 MG tablet Take 1 tablet by mouth once daily 90 tablet 0  . ramipril (ALTACE) 2.5 MG capsule Take 1 capsule by mouth once daily 90 capsule 2  . spironolactone (ALDACTONE) 25 MG tablet Take 1 tablet by mouth once daily 90 tablet 0  . traMADol (ULTRAM) 50 MG tablet Take 1 - 2 tablets every 6 hours as needed for pain     No current facility-administered medications on file prior to visit.   Past Medical History:  Diagnosis Date  . Ankle pain   . Chronic pain    back  . COPD (chronic obstructive pulmonary disease) ([place])   . Depression   . Diabetes mellitus   . GERD (gastroesophageal reflux disease)   . Hyperlipidemia   . Hypertension   . Low back pain   . Obstructive sleep apnea   . Pneumonia 2010  . Post-menopausal atrophic vaginitis   .  Superficial phlebitis    Past Surgical History:  Procedure Laterality Date  . BACK SURGERY  1990  . CESAREAN SECTION    . CYST REMOVAL HAND     inner thigh  . SPINE SURGERY     lumbar laminectomy and diskectomy    Family History  Problem Relation Age of Onset  . Diabetes Mother   . Heart disease Mother   . Cancer Father   . Stroke Sister    Social History   Socioeconomic History  . Marital status: Single    Spouse name: Not on file  . Number of children: Not on file  . Years of education: Not on file  . Highest education level: Not on file  Occupational  History  . Not on file  Tobacco Use  . Smoking status: Former Smoker    Quit date: 03/20/2009    Years since quitting: 11.0  . Smokeless tobacco: Never Used  Substance and Sexual Activity  . Alcohol use: No  . Drug use: No  . Sexual activity: Not on file  Other Topics Concern  . Not on file  Social History Narrative  . Not on file   Social Determinants of Health   Financial Resource Strain:   . Difficulty of Paying Living Expenses:   Food Insecurity:   . Worried About Charity fundraiser in the Last Year:   . Arboriculturist in the Last Year:   Transportation Needs:   . Film/video editor (Medical):   Marland Kitchen Lack of Transportation (Non-Medical):   Physical Activity:   . Days of Exercise per Week:   . Minutes of Exercise per Session:   Stress:   . Feeling of Stress :   Social Connections:   . Frequency of Communication with Friends and Family:   . Frequency of Social Gatherings with Friends and Family:   . Attends Religious Services:   . Active Member of Clubs or Organizations:   . Attends Archivist Meetings:   Marland Kitchen Marital Status:     Review of Systems  Constitutional: Negative for chills, fatigue and fever.  HENT: Negative for congestion, ear pain, rhinorrhea and sore throat.   Respiratory: Negative for cough and shortness of breath.   Cardiovascular: Negative for chest pain.  Gastrointestinal: Negative for abdominal pain, constipation, diarrhea, nausea and vomiting.  Genitourinary: Negative for dysuria and urgency.  Musculoskeletal: Negative for back pain and myalgias.  Neurological: Negative for dizziness, weakness, light-headedness and headaches.  Psychiatric/Behavioral: Negative for dysphoric mood. The patient is not nervous/anxious.      Objective:  BP (!) 132/82   Pulse 72   Temp (!) 97.3 F (36.3 C)   Ht 5\' 3"  (1.6 m)   Wt (!) 261 lb (118.4 kg)   SpO2 98%   BMI 46.23 kg/m   BP/Weight 04/15/2020 01/07/2020 2/45/8099  Systolic BP 833 825 053   Diastolic BP 82 64 70  Wt. (Lbs) 261 268 265.8  BMI 46.23 47.47 47.08    Physical Exam Vitals reviewed.  Constitutional:      Appearance: Normal appearance. She is normal weight.  Cardiovascular:     Rate and Rhythm: Normal rate and regular rhythm.     Pulses: Normal pulses.     Heart sounds: Normal heart sounds.  Pulmonary:     Effort: Pulmonary effort is normal. No respiratory distress.     Breath sounds: Normal breath sounds.  Abdominal:     General: Abdomen is flat. Bowel sounds  are normal.     Palpations: Abdomen is soft.     Tenderness: There is no abdominal tenderness.  Skin:    Comments: Plantar wart. Decreased sensation on BL feet.   Neurological:     Mental Status: She is alert and oriented to person, place, and time.  Psychiatric:        Mood and Affect: Mood normal.        Behavior: Behavior normal.      Lab Results  Component Value Date   WBC 10.4 04/15/2020   HGB 14.0 04/15/2020   HCT 43.5 04/15/2020   PLT 234 04/15/2020   GLUCOSE 90 04/15/2020   CHOL 153 04/15/2020   TRIG 57 04/15/2020   HDL 52 04/15/2020   LDLCALC 89 04/15/2020   ALT 17 04/15/2020   AST 15 04/15/2020   NA 142 04/15/2020   K 4.4 04/15/2020   CL 105 04/15/2020   CREATININE 0.68 04/15/2020   BUN 19 04/15/2020   CO2 24 04/15/2020   HGBA1C 6.7 (H) 04/15/2020      Assessment & Plan:   1. Mixed hyperlipidemia Well controlled.  No changes to medicines.  Continue to work on eating a healthy diet and exercise.  Labs drawn today.  - Lipid panel  2. Essential hypertension Well controlled.  No changes to medicines.  Continue to work on eating a healthy diet and exercise.  Labs drawn today.  - Comprehensive metabolic panel  3. Plantar wart - refer to podiatry  4. Type 2 diabetes mellitus without complication, without long-term current use of insulin (HCC) Well controlled.  No changes to medicines.  Continue to work on eating a healthy diet and exercise.  Labs drawn  today.  - CBC with Differential/Platelet - Hemoglobin A1c    Meds ordered this encounter  Medications  . glucose blood (RELION GLUCOSE TEST STRIPS) test strip    Sig: Use as instructed    Dispense:  100 each    Refill:  2  . pravastatin (PRAVACHOL) 40 MG tablet    Sig: Take 1 tablet (40 mg total) by mouth at bedtime.    Dispense:  90 tablet    Refill:  1  . sitaGLIPtin-metformin (JANUMET) 50-1000 MG tablet    Sig: Take 1 tablet by mouth 2 (two) times daily with a meal.    Dispense:  180 tablet    Refill:  0    Orders Placed This Encounter  Procedures  . CBC with Differential/Platelet  . Comprehensive metabolic panel  . Hemoglobin A1c  . Lipid panel  . Cardiovascular Risk Assessment  . Ambulatory referral to Podiatry     Follow-up: No follow-ups on file.  An After Visit Summary was printed and given to the patient.  [name] [facility] [phone]

## 2020-04-15 ENCOUNTER — Ambulatory Visit (INDEPENDENT_AMBULATORY_CARE_PROVIDER_SITE_OTHER): Payer: Medicare HMO | Admitting: Family Medicine

## 2020-04-15 ENCOUNTER — Other Ambulatory Visit: Payer: Self-pay

## 2020-04-15 VITALS — BP 132/82 | HR 72 | Temp 97.3°F | Ht 63.0 in | Wt 261.0 lb

## 2020-04-15 DIAGNOSIS — I10 Essential (primary) hypertension: Secondary | ICD-10-CM

## 2020-04-15 DIAGNOSIS — E782 Mixed hyperlipidemia: Secondary | ICD-10-CM

## 2020-04-15 DIAGNOSIS — Z794 Long term (current) use of insulin: Secondary | ICD-10-CM

## 2020-04-15 DIAGNOSIS — E119 Type 2 diabetes mellitus without complications: Secondary | ICD-10-CM | POA: Diagnosis not present

## 2020-04-15 DIAGNOSIS — E11628 Type 2 diabetes mellitus with other skin complications: Secondary | ICD-10-CM | POA: Diagnosis not present

## 2020-04-15 DIAGNOSIS — B070 Plantar wart: Secondary | ICD-10-CM

## 2020-04-15 MED ORDER — RELION BLOOD GLUCOSE TEST VI STRP: ORAL_STRIP | 2 refills | Status: DC

## 2020-04-15 MED ORDER — PRAVASTATIN SODIUM 40 MG PO TABS: 40.0000 mg | ORAL_TABLET | Freq: Every day | ORAL | 1 refills | Status: DC

## 2020-04-15 MED ORDER — JANUMET 50-1000 MG PO TABS: 1.0000 | ORAL_TABLET | Freq: Two times a day (BID) | ORAL | 0 refills | Status: DC

## 2020-04-16 LAB — CBC WITH DIFFERENTIAL/PLATELET
Basophils Absolute: 0 10*3/uL (ref 0.0–0.2)
Basos: 0 %
EOS (ABSOLUTE): 0.4 10*3/uL (ref 0.0–0.4)
Eos: 4 %
Hematocrit: 43.5 % (ref 34.0–46.6)
Hemoglobin: 14 g/dL (ref 11.1–15.9)
Immature Grans (Abs): 0 10*3/uL (ref 0.0–0.1)
Immature Granulocytes: 0 %
Lymphocytes Absolute: 3.2 10*3/uL — ABNORMAL HIGH (ref 0.7–3.1)
Lymphs: 31 %
MCH: 27.9 pg (ref 26.6–33.0)
MCHC: 32.2 g/dL (ref 31.5–35.7)
MCV: 87 fL (ref 79–97)
Monocytes Absolute: 0.8 10*3/uL (ref 0.1–0.9)
Monocytes: 8 %
Neutrophils Absolute: 5.9 10*3/uL (ref 1.4–7.0)
Neutrophils: 57 %
Platelets: 234 10*3/uL (ref 150–450)
RBC: 5.02 x10E6/uL (ref 3.77–5.28)
RDW: 13.5 % (ref 11.7–15.4)
WBC: 10.4 10*3/uL (ref 3.4–10.8)

## 2020-04-16 LAB — COMPREHENSIVE METABOLIC PANEL
ALT: 17 IU/L (ref 0–32)
AST: 15 IU/L (ref 0–40)
Albumin/Globulin Ratio: 1.8 (ref 1.2–2.2)
Albumin: 4.2 g/dL (ref 3.8–4.9)
Alkaline Phosphatase: 61 IU/L (ref 48–121)
BUN/Creatinine Ratio: 28 (ref 12–28)
BUN: 19 mg/dL (ref 8–27)
Bilirubin Total: 0.2 mg/dL (ref 0.0–1.2)
CO2: 24 mmol/L (ref 20–29)
Calcium: 9.4 mg/dL (ref 8.7–10.3)
Chloride: 105 mmol/L (ref 96–106)
Creatinine, Ser: 0.68 mg/dL (ref 0.57–1.00)
GFR calc Af Amer: 110 mL/min/{1.73_m2} (ref >59)
GFR calc non Af Amer: 95 mL/min/{1.73_m2} (ref >59)
Globulin, Total: 2.4 g/dL (ref 1.5–4.5)
Glucose: 90 mg/dL (ref 65–99)
Potassium: 4.4 mmol/L (ref 3.5–5.2)
Sodium: 142 mmol/L (ref 134–144)
Total Protein: 6.6 g/dL (ref 6.0–8.5)

## 2020-04-16 LAB — HEMOGLOBIN A1C
Est. average glucose Bld gHb Est-mCnc: 146 mg/dL
Hgb A1c MFr Bld: 6.7 % — ABNORMAL HIGH (ref 4.8–5.6)

## 2020-04-16 LAB — LIPID PANEL
Chol/HDL Ratio: 2.9 ratio (ref 0.0–4.4)
Cholesterol, Total: 153 mg/dL (ref 100–199)
HDL: 52 mg/dL (ref >39)
LDL Chol Calc (NIH): 89 mg/dL (ref 0–99)
Triglycerides: 57 mg/dL (ref 0–149)
VLDL Cholesterol Cal: 12 mg/dL (ref 5–40)

## 2020-04-16 LAB — CARDIOVASCULAR RISK ASSESSMENT

## 2020-04-17 ENCOUNTER — Encounter: Payer: Self-pay | Admitting: Family Medicine

## 2020-04-17 DIAGNOSIS — E119 Type 2 diabetes mellitus without complications: Secondary | ICD-10-CM | POA: Insufficient documentation

## 2020-04-18 ENCOUNTER — Other Ambulatory Visit: Payer: Self-pay

## 2020-04-18 NOTE — Telephone Encounter (Signed)
Patient called to state she is in the doughnut hole for Janumet, samples placed in the sample colset.

## 2020-04-19 ENCOUNTER — Other Ambulatory Visit: Payer: Self-pay | Admitting: Family Medicine

## 2020-04-21 ENCOUNTER — Other Ambulatory Visit: Payer: Self-pay

## 2020-04-21 MED ORDER — GLIMEPIRIDE 4 MG PO TABS: 4.0000 mg | ORAL_TABLET | Freq: Two times a day (BID) | ORAL | 0 refills | Status: DC

## 2020-04-30 ENCOUNTER — Other Ambulatory Visit: Payer: Self-pay | Admitting: Physician Assistant

## 2020-06-02 ENCOUNTER — Other Ambulatory Visit: Payer: Self-pay | Admitting: Physician Assistant

## 2020-06-07 ENCOUNTER — Other Ambulatory Visit: Payer: Self-pay | Admitting: Family Medicine

## 2020-06-13 ENCOUNTER — Other Ambulatory Visit: Payer: Self-pay

## 2020-06-13 MED ORDER — JANUMET 50-1000 MG PO TABS: 1.0000 | ORAL_TABLET | Freq: Two times a day (BID) | ORAL | 0 refills | Status: DC

## 2020-06-16 ENCOUNTER — Other Ambulatory Visit: Payer: Self-pay

## 2020-06-16 MED ORDER — METFORMIN HCL 1000 MG PO TABS: 1000.0000 mg | ORAL_TABLET | Freq: Two times a day (BID) | ORAL | 1 refills | Status: DC

## 2020-06-18 DIAGNOSIS — S97111A Crushing injury of right great toe, initial encounter: Secondary | ICD-10-CM | POA: Diagnosis not present

## 2020-07-06 ENCOUNTER — Encounter: Payer: Self-pay | Admitting: Nurse Practitioner

## 2020-07-06 ENCOUNTER — Other Ambulatory Visit: Payer: Self-pay

## 2020-07-06 ENCOUNTER — Ambulatory Visit (INDEPENDENT_AMBULATORY_CARE_PROVIDER_SITE_OTHER): Payer: Medicare HMO | Admitting: Nurse Practitioner

## 2020-07-06 VITALS — BP 128/68 | HR 88 | Temp 97.9°F | Ht 63.0 in | Wt 265.0 lb

## 2020-07-06 DIAGNOSIS — E1169 Type 2 diabetes mellitus with other specified complication: Secondary | ICD-10-CM | POA: Diagnosis not present

## 2020-07-06 DIAGNOSIS — S91209D Unspecified open wound of unspecified toe(s) with damage to nail, subsequent encounter: Secondary | ICD-10-CM | POA: Diagnosis not present

## 2020-07-06 DIAGNOSIS — E785 Hyperlipidemia, unspecified: Secondary | ICD-10-CM

## 2020-07-06 NOTE — Progress Notes (Signed)
Subjective:  Patient ID: Brandi Velazquez, female    DOB: 06-27-1961  Age: 59 y.o. MRN: [id number]  Chief Complaint  Patient presents with  . Urgent Care follow up    Broken Big Toe on right foot    HPI  Brandi Velazquez is a 59 year old Caucasian female present for evaluation of right great toe fracture. She states the injury occurred approximately two and a half weeks ago when a 2-liter soft drink fell from a shelf to onto her foot. She was seen at Urgent Care who prescribed antibiotics that she has completed. Other medical history includes diabetes mellitus, hypertension, hyperlipidemia, and GERD.   Current Outpatient Medications on File Prior to Visit  Medication Sig Dispense Refill  . aspirin EC 81 MG tablet Take 81 mg by mouth daily.    Marland Kitchen BLACK COHOSH EXTRACT PO Take by mouth daily.    Marland Kitchen glimepiride (AMARYL) 4 MG tablet Take 1 tablet (4 mg total) by mouth 2 (two) times daily. 180 tablet 0  . glucose blood (RELION GLUCOSE TEST STRIPS) test strip Use as instructed 100 each 2  . HYDROcodone-acetaminophen (NORCO/VICODIN) 5-325 MG tablet Take 1 tablet by mouth every 6 (six) hours as needed for moderate pain. 30 tablet 0  . ibuprofen (ADVIL) 800 MG tablet Take 1 tablet (800 mg total) by mouth every 8 (eight) hours as needed. 30 tablet 0  . metFORMIN (GLUCOPHAGE) 1000 MG tablet Take 1 tablet (1,000 mg total) by mouth 2 (two) times daily with a meal. 60 tablet 1  . Multiple Vitamin (MULTIVITAMIN) tablet Take 1 tablet by mouth daily.    Marland Kitchen NOVOLIN 70/30 RELION (70-30) 100 UNIT/ML injection INJECT 45 UNITS SUBCUTANEOUSLY BEFORE BREAKFAST AND 50 UNITS BEFORE SUPPER 30 mL 0  . pioglitazone (ACTOS) 30 MG tablet Take 1 tablet by mouth once daily 90 tablet 0  . pravastatin (PRAVACHOL) 40 MG tablet Take 1 tablet (40 mg total) by mouth at bedtime. 90 tablet 1  . ramipril (ALTACE) 2.5 MG capsule Take 1 capsule by mouth once daily 90 capsule 2  . spironolactone (ALDACTONE) 25 MG tablet Take 1 tablet by mouth once  daily 90 tablet 0  . traMADol (ULTRAM) 50 MG tablet Take 1 - 2 tablets every 6 hours as needed for pain     No current facility-administered medications on file prior to visit.   Past Medical History:  Diagnosis Date  . Ankle pain   . Chronic pain    back  . COPD (chronic obstructive pulmonary disease) ([place])   . Depression   . Diabetes mellitus   . GERD (gastroesophageal reflux disease)   . Hyperlipidemia   . Hypertension   . Low back pain   . Obstructive sleep apnea   . Pneumonia 2010  . Post-menopausal atrophic vaginitis   . Superficial phlebitis    Past Surgical History:  Procedure Laterality Date  . BACK SURGERY  1990  . CESAREAN SECTION    . CYST REMOVAL HAND     inner thigh  . SPINE SURGERY     lumbar laminectomy and diskectomy    Family History  Problem Relation Age of Onset  . Diabetes Mother   . Heart disease Mother   . Cancer Father   . Stroke Sister    Social History   Socioeconomic History  . Marital status: Single    Spouse name: Not on file  . Number of children: Not on file  . Years of education: Not on file  . Highest  education level: Not on file  Occupational History  . Not on file  Tobacco Use  . Smoking status: Former Smoker    Quit date: 03/20/2009    Years since quitting: 11.3  . Smokeless tobacco: Never Used  Substance and Sexual Activity  . Alcohol use: No  . Drug use: No  . Sexual activity: Not on file  Other Topics Concern  . Not on file  Social History Narrative  . Not on file   Social Determinants of Health   Financial Resource Strain:   . Difficulty of Paying Living Expenses: Not on file  Food Insecurity:   . Worried About Charity fundraiser in the Last Year: Not on file  . Ran Out of Food in the Last Year: Not on file  Transportation Needs:   . Lack of Transportation (Medical): Not on file  . Lack of Transportation (Non-Medical): Not on file  Physical Activity:   . Days of Exercise per Week: Not on file  . Minutes  of Exercise per Session: Not on file  Stress:   . Feeling of Stress : Not on file  Social Connections:   . Frequency of Communication with Friends and Family: Not on file  . Frequency of Social Gatherings with Friends and Family: Not on file  . Attends Religious Services: Not on file  . Active Member of Clubs or Organizations: Not on file  . Attends Archivist Meetings: Not on file  . Marital Status: Not on file    Review of Systems  Constitutional: Negative for fatigue and fever.  HENT: Negative for congestion, ear pain, sinus pressure and sore throat.   Eyes: Negative for pain.  Respiratory: Negative for cough, chest tightness, shortness of breath and wheezing.   Cardiovascular: Negative for chest pain and palpitations.  Gastrointestinal: Negative for abdominal pain, constipation, diarrhea, nausea and vomiting.  Genitourinary: Negative for dysuria and hematuria.  Musculoskeletal: Negative for arthralgias, back pain, joint swelling and myalgias.       Big Toe pain Right foot  Skin: Negative for rash.  Neurological: Negative for dizziness, weakness and headaches.  Psychiatric/Behavioral: Negative for dysphoric mood. The patient is not nervous/anxious.      Objective:  BP 128/68 (BP Location: Left Arm, Patient Position: Sitting)   Pulse 88   Temp 97.9 F (36.6 C) (Temporal)   Wt 265 lb (120.2 kg)   SpO2 97%   BMI 46.94 kg/m   BP/Weight 07/06/2020 04/15/2020 3/53/6144  Systolic BP 315 400 867  Diastolic BP 68 82 64  Wt. (Lbs) 265 261 268  BMI 46.94 46.23 47.47    Physical Exam Constitutional:      Appearance: Normal appearance.  HENT:     Right Ear: Tympanic membrane, ear canal and external ear normal.     Left Ear: Tympanic membrane, ear canal and external ear normal.     Nose: Nose normal.     Mouth/Throat:     Mouth: Mucous membranes are moist.  Cardiovascular:     Rate and Rhythm: Normal rate and regular rhythm.     Pulses: Normal pulses.     Heart  sounds: Normal heart sounds.  Pulmonary:     Effort: Pulmonary effort is normal.     Breath sounds: Normal breath sounds.  Abdominal:     Palpations: Abdomen is soft.  Musculoskeletal:        General: Normal range of motion.     Cervical back: Normal range of motion.  Right foot: Tenderness (Big toe broken) present.  Skin:    General: Skin is warm and dry.     Capillary Refill: Capillary refill takes less than 2 seconds.     Comments: Healing wound to right great toe  Neurological:     Mental Status: She is oriented to person, place, and time.  Psychiatric:        Mood and Affect: Mood normal.        Behavior: Behavior normal.        Thought Content: Thought content normal.        Judgment: Judgment normal.     Diabetic Foot Exam - Simple   No data filed       Lab Results  Component Value Date   WBC 10.4 04/15/2020   HGB 14.0 04/15/2020   HCT 43.5 04/15/2020   PLT 234 04/15/2020   GLUCOSE 90 04/15/2020   CHOL 153 04/15/2020   TRIG 57 04/15/2020   HDL 52 04/15/2020   LDLCALC 89 04/15/2020   ALT 17 04/15/2020   AST 15 04/15/2020   NA 142 04/15/2020   K 4.4 04/15/2020   CL 105 04/15/2020   CREATININE 0.68 04/15/2020   BUN 19 04/15/2020   CO2 24 04/15/2020   HGBA1C 6.7 (H) 04/15/2020      Assessment & Plan:   There are no diagnoses linked to this encounter.   No orders of the defined types were placed in this encounter.   No orders of the defined types were placed in this encounter.    Follow-up: No follow-ups on file.  An After Visit Summary was printed and given to the patient.  [place] [phone]

## 2020-07-06 NOTE — Patient Instructions (Signed)
Rinse right great toe with saline daily, blot dry and keep covered with dressing Continue to monitor for signs/symptoms of infection Take Tylenol or Ibuprofen as needed for pain Contact office if symptoms or pain worsen Keep appointment with podiatrist Nail Avulsion Nail avulsion is when a nail tears away from the nail bed due to an accident or injury. Nail avulsion can be painful. Your finger or toe may bleed a lot, and you may have some pain, redness, throbbing, and swelling while it heals. Your nail will grow back within several months. Once it grows back, it might not look the same as the old nail. This may happen even after taking good care of it. Follow these instructions at home: Wound care  Follow instructions from your health care provider about how to take care of your wound. Make sure you: ? Wash your hands with soap and water before and after you change your bandage (dressing). If soap and water are not available, use hand sanitizer. ? Change your dressing as told by your health care provider. ? Leave stitches (sutures), skin glue, or adhesive strips in place, if present. These skin closures may need to stay in place for 2 weeks or longer. If adhesive strip edges start to loosen and curl up, you may trim the loose edges. Do not remove adhesive strips completely unless your health care provider tells you to do that.  Check your wound every day for signs of infection. Check for: ? Redness, swelling, or pain. ? Fluid or blood. ? Warmth. ? Pus or a bad smell.  Do not take baths, swim, or use a hot tub until your health care provider approves. Ask your health care provider if you may take showers. You may only be allowed to take sponge baths.  If you notice bleeding, press gently on the nail bed with a gauze pad. Do this for 15 minutes.  Keep the wound dry for 48 hours. After 48 hours have passed, lightly wash the finger or toe in warm, soapy water 2-3 times a day. This helps to  reduce pain and swelling. It also prevents infection. Medicine  Take over-the-counter and prescription medicines only as told by your health care provider. Do not take aspirin or products containing aspirin unless directed by your health care provider. These products can increase bleeding.  If you were prescribed an antibiotic medicine, take it or apply it as told by your health care provider. Do not stop taking or using the antibiotic even if you start to feel better. General instructions   Keep the injured hand or foot raised above the level of your heart as much as possible in the first 48 hours after the injury. This helps to reduce pain and swelling.  Move the toe or finger often to avoid stiffness.  Do not use any products that contain nicotine or tobacco, such as cigarettes, e-cigarettes, and chewing tobacco. These can delay healing. If you need help quitting, ask your health care provider. Contact a health care provider if:  You have increased redness, swelling, or pain around your wound.  You have fluid or blood coming from your wound.  You have pus or a bad smell coming from your wound.  Your wound or the area around your wound feels warm to the touch. Get help right away if:  You have bleeding that does not stop, even when you apply pressure to the wound.  You have a temperature that is higher than 100.40F (38C).  The affected  finger or toe looks white or black. Summary  Nail avulsion is when a nail tears away from the nail bed due to an accident or injury.  Follow instructions from your health care provider about how to take care of your wound.  Keep the injured hand or foot raised above the level of your heart as much as possible in the first 48 hours after the injury. This helps to reduce pain and swelling.  Check your wound every day for signs of infection.  Contact a health care provider if you have increased redness, swelling, or pain around your wound. This  information is not intended to replace advice given to you by your health care provider. Make sure you discuss any questions you have with your health care provider. Document Revised: 04/30/2018 Document Reviewed: 04/30/2018 Elsevier Patient Education  [employer].   Toe Fracture A toe fracture is a break in one of the toe bones (phalanges). This may happen if you:  Drop a heavy object on your toe.  Stub your toe.  Twist your toe.  Exercise the same way too much. What are the signs or symptoms? The main symptoms are swelling and pain in the toe. You may also have:  Bruising.  Stiffness.  Numbness.  A change in the way the toe looks.  Broken bones that poke through the skin.  Blood under the toenail. How is this treated? Treatments may include:  Taping the broken toe to a toe that is next to it (buddy taping).  Wearing a shoe that has a wide, rigid sole to protect the toe and to limit its movement.  Wearing a cast.  Surgery. This may be needed if the: ? Pieces of broken bone are out of place. ? Bone pokes through the skin.  Physical therapy. Follow these instructions at home: If you have a shoe:  Wear the shoe as told by your doctor. Remove it only as told by your doctor.  Loosen the shoe if your toes tingle, become numb, or turn cold and blue.  Keep the shoe clean and dry. If you have a cast:  Do not put pressure on any part of the cast until it is fully hardened. This may take a few hours.  Do not stick anything inside the cast to scratch your skin.  Check the skin around the cast every day. Tell your doctor about any concerns.  You may put lotion on dry skin around the edges of the cast.  Do not put lotion on the skin under the cast.  Keep the cast clean and dry. Bathing  Do not take baths, swim, or use a hot tub until your doctor says it is okay. Ask your doctor if you can take showers.  If the shoe or cast is not waterproof: ? Do not let  it get wet. ? Cover it with a watertight covering when you take a bath or a shower. Activity  Do not use your foot to support your body weight until your doctor says it is okay.  Use crutches as told by your doctor.  Ask your doctor what activities are safe for you during recovery.  Avoid activities as told by your doctor.  Do exercises as told by your doctor or therapist. Driving  Do not drive or use heavy machinery while taking pain medicine.  Do not drive while wearing a cast on a foot that you use for driving. Managing pain, stiffness, and swelling   Put ice on  the injured area if told by your doctor: ? Put ice in a plastic bag. ? Place a towel between your skin and the bag.  If you have a shoe, remove it as told by your doctor.  If you have a cast, place a towel between your cast and the bag. ? Leave the ice on for 20 minutes, 2-3 times per day.  Raise (elevate) the injured area above the level of your heart while you are sitting or lying down. General instructions  If your toe was taped to a toe that is next to it, follow your doctor's instructions for changing the gauze and tape. Change it more often: ? If the gauze and tape get wet. If this happens, dry the space between the toes. ? If the gauze and tape are too tight and they cause your toe to become pale or to lose feeling (go numb).  If your doctor did not give you a protective shoe, wear sturdy shoes that support your foot. Your shoes should not: ? Pinch your toes. ? Fit tightly against your toes.  Do not use any tobacco products, including cigarettes, chewing tobacco, or e-cigarettes. These can delay bone healing. If you need help quitting, ask your doctor.  Take medicines only as told by your doctor.  Keep all follow-up visits as told by your doctor. This is important. Contact a doctor if:  Your pain medicine is not helping.  You have a fever.  You notice a bad smell coming from your cast. Get help  right away if:  You lose feeling (have numbness) in your toe or foot, and it is getting worse.  Your toe or your foot tingles.  Your toe or your foot gets cold or turns blue.  You have redness or swelling in your toe or foot, and it is getting worse.  You have very bad pain. Summary  A toe fracture is a break in one of the toe bones.  Use ice and raise your foot. This will help lessen pain and swelling.  Use crutches as told by your doctor. This information is not intended to replace advice given to you by your health care provider. Make sure you discuss any questions you have with your health care provider. Document Revised: 11/07/2017 Document Reviewed: 10/15/2017 Elsevier Patient Education  2020 [employer].

## 2020-07-09 ENCOUNTER — Other Ambulatory Visit: Payer: Self-pay | Admitting: Family Medicine

## 2020-07-09 ENCOUNTER — Other Ambulatory Visit: Payer: Self-pay | Admitting: Physician Assistant

## 2020-07-13 ENCOUNTER — Other Ambulatory Visit: Payer: Self-pay

## 2020-07-13 ENCOUNTER — Encounter: Payer: Self-pay | Admitting: Sports Medicine

## 2020-07-13 ENCOUNTER — Ambulatory Visit: Payer: Medicare HMO | Admitting: Sports Medicine

## 2020-07-13 DIAGNOSIS — S99921A Unspecified injury of right foot, initial encounter: Secondary | ICD-10-CM

## 2020-07-13 DIAGNOSIS — L608 Other nail disorders: Secondary | ICD-10-CM

## 2020-07-13 DIAGNOSIS — E0800 Diabetes mellitus due to underlying condition with hyperosmolarity without nonketotic hyperglycemic-hyperosmolar coma (NKHHC): Secondary | ICD-10-CM

## 2020-07-13 DIAGNOSIS — M79671 Pain in right foot: Secondary | ICD-10-CM

## 2020-07-13 DIAGNOSIS — B070 Plantar wart: Secondary | ICD-10-CM

## 2020-07-13 NOTE — Progress Notes (Signed)
Subjective: Brandi Velazquez is a 59 y.o. female patient who presents to office for evaluation of Right first toe injury and wart.  Patient reports that a 2 L Coke bottle fell on her foot on October 9 and reports that she went to urgent care and they treated her with 2 rounds of antibiotic x-rays and said that she had a broken toe and put her in a surgical shoe.  Patient reports that it is slowly healing but there is some bleeding around the nail has been applying antibiotic cream and Band-Aid daily.  Patient also admits that she has been using Compound W on a wart at the bottom of her foot states that it seems like it is getting better but there is still one very tender spot.  Patient denies nausea vomiting fever chills or any constitutional symptoms at this time.  Review of systems noncontributory.  Patient Active Problem List   Diagnosis Date Noted  . Diabetes mellitus ([place]) 04/17/2020  . Hyperlipidemia   . Hypertension   . GERD (gastroesophageal reflux disease)   . Acute left-sided low back pain without sciatica 01/10/2020  . Back pain of lumbar region with sciatica 01/07/2020  . Other acute recurrent sinusitis 10/29/2019  . Cough 10/29/2019  . Varicose veins of lower extremities with other complications 00/17/4944  . Varicose veins of lower extremities with inflammation 06/14/2011    Current Outpatient Medications on File Prior to Visit  Medication Sig Dispense Refill  . aspirin EC 81 MG tablet Take 81 mg by mouth daily.    Marland Kitchen BLACK COHOSH EXTRACT PO Take by mouth daily.    Marland Kitchen glimepiride (AMARYL) 4 MG tablet Take 1 tablet (4 mg total) by mouth 2 (two) times daily. 180 tablet 0  . glucose blood (RELION GLUCOSE TEST STRIPS) test strip Use as instructed 100 each 2  . HYDROcodone-acetaminophen (NORCO/VICODIN) 5-325 MG tablet Take 1 tablet by mouth every 6 (six) hours as needed for moderate pain. 30 tablet 0  . ibuprofen (ADVIL) 800 MG tablet Take 1 tablet (800 mg total) by mouth every 8  (eight) hours as needed. 30 tablet 0  . metFORMIN (GLUCOPHAGE) 1000 MG tablet Take 1 tablet (1,000 mg total) by mouth 2 (two) times daily with a meal. 60 tablet 1  . Multiple Vitamin (MULTIVITAMIN) tablet Take 1 tablet by mouth daily.    Marland Kitchen NOVOLIN 70/30 RELION (70-30) 100 UNIT/ML injection INJECT 45 UNITS SUBCUTANEOUSLY BEFORE BREAKFAST AND 50 UNITS BEFORE SUPPER 30 mL 2  . pioglitazone (ACTOS) 30 MG tablet Take 1 tablet by mouth once daily 90 tablet 0  . pravastatin (PRAVACHOL) 40 MG tablet Take 1 tablet (40 mg total) by mouth at bedtime. 90 tablet 1  . ramipril (ALTACE) 2.5 MG capsule Take 1 capsule by mouth once daily 90 capsule 2  . spironolactone (ALDACTONE) 25 MG tablet Take 1 tablet by mouth once daily 90 tablet 0  . traMADol (ULTRAM) 50 MG tablet Take 1 - 2 tablets every 6 hours as needed for pain     No current facility-administered medications on file prior to visit.    Allergies  Allergen Reactions  . Bactrim [Sulfamethoxazole-Trimethoprim] Swelling  . Morphine And Related     Objective:  General: Alert and oriented x3 in no acute distress  Dermatology: Keratotic lesion present measuring less than 0.5 at segment 4 on right with no skin lines transversing the lesion, pain is present with medial lateral pressure to the lesion, capillaries with pin point bleeding noted, no  webspace macerations, dried blood noted to the medial and lateral nail folds of the right hallux with thickening and distal onycholysis of nail likely due to overmedication of use of excessive amount of Neosporin.  There is no significant redness warmth or swelling to right great toe however with palpation there is pain to the toe and toenail area.  Vascular: Dorsalis Pedis and Posterior Tibial pedal pulses 1/4, Capillary Fill Time 3 seconds, + pedal hair growth bilateral, no edema bilateral lower extremities, Temperature gradient within normal limits.  Neurology: Johney [place] sensation intact via light touch  bilateral.  Musculoskeletal: Mild tenderness with palpation at the lesion site on Right submet 4 and right hallux.  There is guarding with range of motion due to pain.   Assessment and Plan: Problem List Items Addressed This Visit      Endocrine   Diabetes mellitus ([place])    Other Visit Diagnoses    Plantar wart, right foot    -  Primary   Toe injury, right, initial encounter       Hemorrhage of nail       Right foot pain          -Complete examination performed -Discussed treatment options for wart and toe injury -Parred keratoic warty lesion using a chisel blade; treated the area with Catharidin covered with bandaid; Advised patient of blistering reaction that will occur from application of medication and once this happens replace bandaid with neosporin and tape/bandaid -Cleanse the right great toe with wound cleanser and applied Betadine and Band-Aid and advised patient to do the same daily and to avoid excessive use of cream to this area -Advised patient if there is soreness may also soak with warm water and Epson salt as needed for right great toe issue -Continue with surgical shoe and at next visit we will x-ray -Also advised patient to consider nail removal if her nail has not improved or healed at next visit -Patient to return to office as scheduled in 3 weeks or sooner if condition worsens.  Landis Martins, DPM

## 2020-07-15 ENCOUNTER — Other Ambulatory Visit: Payer: Self-pay

## 2020-07-15 DIAGNOSIS — Z79899 Other long term (current) drug therapy: Secondary | ICD-10-CM

## 2020-07-18 ENCOUNTER — Encounter: Payer: Self-pay | Admitting: Family Medicine

## 2020-07-18 ENCOUNTER — Other Ambulatory Visit: Payer: Self-pay

## 2020-07-18 ENCOUNTER — Other Ambulatory Visit: Payer: Self-pay | Admitting: Physician Assistant

## 2020-07-18 ENCOUNTER — Ambulatory Visit (INDEPENDENT_AMBULATORY_CARE_PROVIDER_SITE_OTHER): Payer: Medicare HMO | Admitting: Family Medicine

## 2020-07-18 VITALS — BP 118/60 | HR 80 | Temp 97.2°F | Ht 63.0 in | Wt 265.0 lb

## 2020-07-18 DIAGNOSIS — Z23 Encounter for immunization: Secondary | ICD-10-CM

## 2020-07-18 DIAGNOSIS — E1169 Type 2 diabetes mellitus with other specified complication: Secondary | ICD-10-CM

## 2020-07-18 DIAGNOSIS — I10 Essential (primary) hypertension: Secondary | ICD-10-CM | POA: Diagnosis not present

## 2020-07-18 DIAGNOSIS — Z794 Long term (current) use of insulin: Secondary | ICD-10-CM | POA: Diagnosis not present

## 2020-07-18 DIAGNOSIS — G8929 Other chronic pain: Secondary | ICD-10-CM

## 2020-07-18 DIAGNOSIS — B354 Tinea corporis: Secondary | ICD-10-CM

## 2020-07-18 DIAGNOSIS — M545 Low back pain, unspecified: Secondary | ICD-10-CM | POA: Diagnosis not present

## 2020-07-18 DIAGNOSIS — E782 Mixed hyperlipidemia: Secondary | ICD-10-CM

## 2020-07-18 DIAGNOSIS — E11628 Type 2 diabetes mellitus with other skin complications: Secondary | ICD-10-CM | POA: Diagnosis not present

## 2020-07-18 DIAGNOSIS — J449 Chronic obstructive pulmonary disease, unspecified: Secondary | ICD-10-CM | POA: Diagnosis not present

## 2020-07-18 NOTE — Patient Instructions (Signed)
Lotrimine otc cream as directed.  Decrease potatoes 1/2 serving.

## 2020-07-18 NOTE — Progress Notes (Signed)
Subjective:  Patient ID: Brandi Velazquez, female    DOB: [birth year]  Age: 59 y.o. MRN: [id number]  Chief Complaint  Patient presents with  . Hyperlipidemia  . Hypertension  . Diabetes    HPI  Brandi Velazquez presents with a diagnosis of obstructive sleep apnea (adult) (pediatric).  Patient currrently wears cpap machine every night     Pt presents with hyperlipidemia.  Current treatment includes Pravachol.  Compliance with treatment has been good; she takes her medication as directed, maintains her low cholesterol diet, and follows up as directed.  She denies experiencing any hypercholesterolemia related symptoms.      Pt presents for follow up of hypertension.  Her current cardiac medication regimen includes a diuretic ( spironolactone ) and an ACE inhibitor ( ramipril ).  She is tolerating the medication well without side effects.  Compliance with treatment has been good; she takes her medication as directed and follows up as directed.      Type 2 diabetes mellitus with diabetic nephropathy details; specifically, this is type 2, insulin requiring diabetes.  Compliance with treatment has been good.  Patient's diabetes was first diagnosed > 20 years ago.  Primary symptoms reported include nerve dysfunction (manifested as numbness Hands ).  She specifically denies excessive thirst, excessively hunger or frequent urination.  Tobacco screen: past smoker.  Current meds include an oral hypoglycemic ( Actos, Amaryl, and Janumet ) and insulin/injectable ( Novolin 70/30 40 U in am and 50 U before supper.)  She has not been checking her sugars. She needs a new glucometer.  In regard to preventative care, she performs foot self-exams daily and her last ophthalmology exam was in 02/2020.    Back pain is bad. Hurts all the time. Ibuprofen 800 mg one three times days. Has seen [facility].  She was recommended a big surgery to fix her back, of which she does not wish to proceed.  Apparently per the patient she was given  a 50-50 shot of improvement and she did not feel these odds were worthwhile.  Current Outpatient Medications on File Prior to Visit  Medication Sig Dispense Refill  . aspirin EC 81 MG tablet Take 81 mg by mouth daily.    Marland Kitchen BLACK COHOSH EXTRACT PO Take by mouth daily.    Marland Kitchen glimepiride (AMARYL) 4 MG tablet Take 1 tablet (4 mg total) by mouth 2 (two) times daily. 180 tablet 0  . glucose blood (RELION GLUCOSE TEST STRIPS) test strip Use as instructed 100 each 2  . HYDROcodone-acetaminophen (NORCO/VICODIN) 5-325 MG tablet Take 1 tablet by mouth every 6 (six) hours as needed for moderate pain. 30 tablet 0  . ibuprofen (ADVIL) 800 MG tablet Take 1 tablet (800 mg total) by mouth every 8 (eight) hours as needed. 30 tablet 0  . metFORMIN (GLUCOPHAGE) 1000 MG tablet Take 1 tablet (1,000 mg total) by mouth 2 (two) times daily with a meal. 60 tablet 1  . Multiple Vitamin (MULTIVITAMIN) tablet Take 1 tablet by mouth daily.    Marland Kitchen NOVOLIN 70/30 RELION (70-30) 100 UNIT/ML injection INJECT 45 UNITS SUBCUTANEOUSLY BEFORE BREAKFAST AND 50 UNITS BEFORE SUPPER 30 mL 2  . pioglitazone (ACTOS) 30 MG tablet Take 1 tablet by mouth once daily 90 tablet 0  . pravastatin (PRAVACHOL) 40 MG tablet Take 1 tablet (40 mg total) by mouth at bedtime. 90 tablet 1  . ramipril (ALTACE) 2.5 MG capsule Take 1 capsule by mouth once daily 90 capsule 2  . spironolactone (ALDACTONE) 25  MG tablet Take 1 tablet by mouth once daily 90 tablet 0  . traMADol (ULTRAM) 50 MG tablet Take 1 - 2 tablets every 6 hours as needed for pain     No current facility-administered medications on file prior to visit.   Past Medical History:  Diagnosis Date  . Ankle pain   . Chronic pain    back  . COPD (chronic obstructive pulmonary disease) ([place])   . Depression   . Diabetes mellitus   . GERD (gastroesophageal reflux disease)   . Hyperlipidemia   . Hypertension   . Low back pain   . Obstructive sleep apnea   . Pneumonia 2010  . Post-menopausal  atrophic vaginitis   . Superficial phlebitis    Past Surgical History:  Procedure Laterality Date  . BACK SURGERY  1990  . CESAREAN SECTION    . CYST REMOVAL HAND     inner thigh  . SPINE SURGERY     lumbar laminectomy and diskectomy    Family History  Problem Relation Age of Onset  . Diabetes Mother   . Heart disease Mother   . Cancer Father   . Stroke Sister    Social History   Socioeconomic History  . Marital status: Single    Spouse name: Not on file  . Number of children: Not on file  . Years of education: Not on file  . Highest education level: Not on file  Occupational History  . Not on file  Tobacco Use  . Smoking status: Former Smoker    Quit date: 03/20/2009    Years since quitting: 11.3  . Smokeless tobacco: Never Used  Substance and Sexual Activity  . Alcohol use: No  . Drug use: No  . Sexual activity: Not on file  Other Topics Concern  . Not on file  Social History Narrative  . Not on file   Social Determinants of Health   Financial Resource Strain:   . Difficulty of Paying Living Expenses: Not on file  Food Insecurity:   . Worried About Charity fundraiser in the Last Year: Not on file  . Ran Out of Food in the Last Year: Not on file  Transportation Needs:   . Lack of Transportation (Medical): Not on file  . Lack of Transportation (Non-Medical): Not on file  Physical Activity:   . Days of Exercise per Week: Not on file  . Minutes of Exercise per Session: Not on file  Stress:   . Feeling of Stress : Not on file  Social Connections:   . Frequency of Communication with Friends and Family: Not on file  . Frequency of Social Gatherings with Friends and Family: Not on file  . Attends Religious Services: Not on file  . Active Member of Clubs or Organizations: Not on file  . Attends Archivist Meetings: Not on file  . Marital Status: Not on file    Review of Systems  Constitutional: Negative for chills, fatigue and fever.  HENT:  Negative for congestion, ear pain, rhinorrhea and sore throat.   Respiratory: Negative for cough and shortness of breath.   Cardiovascular: Negative for chest pain.  Gastrointestinal: Negative for abdominal pain, constipation, diarrhea, nausea and vomiting.  Genitourinary: Negative for dysuria and urgency.  Musculoskeletal: Positive for arthralgias and back pain. Negative for myalgias.  Skin: Positive for rash (behind ear. itches.).  Neurological: Negative for dizziness, weakness, light-headedness and headaches.  Psychiatric/Behavioral: Negative for dysphoric mood. The patient is not nervous/anxious.  Objective:  BP 118/60   Pulse 80   Temp (!) 97.2 F (36.2 C)   Ht 5\' 3"  (1.6 m)   Wt 265 lb (120.2 kg)   SpO2 100%   BMI 46.94 kg/m   BP/Weight 07/18/2020 07/06/2020 12/24/8117  Systolic BP 147 829 562  Diastolic BP 60 68 82  Wt. (Lbs) 265 265 261  BMI 46.94 46.94 46.23    Physical Exam Vitals reviewed.  Constitutional:      Appearance: Normal appearance. She is normal weight.  Neck:     Vascular: No carotid bruit.  Cardiovascular:     Rate and Rhythm: Normal rate and regular rhythm.     Pulses: Normal pulses.     Heart sounds: Normal heart sounds.  Pulmonary:     Effort: Pulmonary effort is normal. No respiratory distress.     Breath sounds: Normal breath sounds.  Abdominal:     General: Abdomen is flat. Bowel sounds are normal.     Palpations: Abdomen is soft.     Tenderness: There is no abdominal tenderness.  Skin:    Findings: Rash (behind ear: raised red rash in circular patter.n ) present.     Comments: Plantar wart. Decreased sensation on BL feet.   Neurological:     Mental Status: She is alert and oriented to person, place, and time.  Psychiatric:        Mood and Affect: Mood normal.        Behavior: Behavior normal.     Diabetic Foot Exam - Simple   Simple Foot Form Visual Inspection See comments: Yes Sensation Testing Intact to touch and  monofilament testing bilaterally: Yes Pulse Check Posterior Tibialis and Dorsalis pulse intact bilaterally: Yes Comments Right great toenail partially off due to trauma. Fractured toe. In surgical shoe.      Lab Results  Component Value Date   WBC 10.4 04/15/2020   HGB 14.0 04/15/2020   HCT 43.5 04/15/2020   PLT 234 04/15/2020   GLUCOSE 90 04/15/2020   CHOL 153 04/15/2020   TRIG 57 04/15/2020   HDL 52 04/15/2020   LDLCALC 89 04/15/2020   ALT 17 04/15/2020   AST 15 04/15/2020   NA 142 04/15/2020   K 4.4 04/15/2020   CL 105 04/15/2020   CREATININE 0.68 04/15/2020   BUN 19 04/15/2020   CO2 24 04/15/2020   HGBA1C 6.7 (H) 04/15/2020      Assessment & Plan:   1. Mixed hyperlipidemia Well controlled.  No changes to medicines.  Continue to work on eating a healthy diet and exercise.  Labs drawn today.  - Lipid panel  2. Essential hypertension Well controlled.  No changes to medicines.  Continue to work on eating a healthy diet and exercise.  Labs drawn today.  - Comprehensive metabolic panel  3. Type 2 diabetes mellitus complicated by hyperlipidemia ([place]) Well controlled.  No changes to medicines.  Continue to work on eating a healthy diet and exercise.  Labs drawn today.  - CBC with Differential/Platelet - Hemoglobin A1c   4. Tinea corporis  Lotrimine cream  5. Flu shot Flucelvex vaccine.  6. Chronic mid back pain Continue ibuprofen.   Orders Placed This Encounter  Procedures  . Flu Vaccine MDCK QUAD PF     Follow-up: Return in about 3 months (around 10/18/2020) for fasting.  An After Visit Summary was printed and given to the patient.  [name] [facility] [phone]

## 2020-07-19 ENCOUNTER — Telehealth: Payer: Self-pay | Admitting: Family Medicine

## 2020-07-19 LAB — LIPID PANEL
Chol/HDL Ratio: 2.9 ratio (ref 0.0–4.4)
Cholesterol, Total: 163 mg/dL (ref 100–199)
HDL: 56 mg/dL (ref >39)
LDL Chol Calc (NIH): 94 mg/dL (ref 0–99)
Triglycerides: 67 mg/dL (ref 0–149)
VLDL Cholesterol Cal: 13 mg/dL (ref 5–40)

## 2020-07-19 LAB — COMPREHENSIVE METABOLIC PANEL
ALT: 17 IU/L (ref 0–32)
AST: 14 IU/L (ref 0–40)
Albumin/Globulin Ratio: 1.6 (ref 1.2–2.2)
Albumin: 4.4 g/dL (ref 3.8–4.9)
Alkaline Phosphatase: 57 IU/L (ref 44–121)
BUN/Creatinine Ratio: 29 — ABNORMAL HIGH (ref 12–28)
BUN: 18 mg/dL (ref 8–27)
Bilirubin Total: 0.2 mg/dL (ref 0.0–1.2)
CO2: 26 mmol/L (ref 20–29)
Calcium: 9.5 mg/dL (ref 8.7–10.3)
Chloride: 107 mmol/L — ABNORMAL HIGH (ref 96–106)
Creatinine, Ser: 0.62 mg/dL (ref 0.57–1.00)
GFR calc Af Amer: 113 mL/min/{1.73_m2} (ref >59)
GFR calc non Af Amer: 98 mL/min/{1.73_m2} (ref >59)
Globulin, Total: 2.8 g/dL (ref 1.5–4.5)
Glucose: 71 mg/dL (ref 65–99)
Potassium: 4.6 mmol/L (ref 3.5–5.2)
Sodium: 143 mmol/L (ref 134–144)
Total Protein: 7.2 g/dL (ref 6.0–8.5)

## 2020-07-19 LAB — CBC WITH DIFFERENTIAL/PLATELET
Basophils Absolute: 0 10*3/uL (ref 0.0–0.2)
Basos: 0 %
EOS (ABSOLUTE): 0.4 10*3/uL (ref 0.0–0.4)
Eos: 4 %
Hematocrit: 43.6 % (ref 34.0–46.6)
Hemoglobin: 13.8 g/dL (ref 11.1–15.9)
Immature Grans (Abs): 0 10*3/uL (ref 0.0–0.1)
Immature Granulocytes: 0 %
Lymphocytes Absolute: 2.9 10*3/uL (ref 0.7–3.1)
Lymphs: 32 %
MCH: 27.4 pg (ref 26.6–33.0)
MCHC: 31.7 g/dL (ref 31.5–35.7)
MCV: 87 fL (ref 79–97)
Monocytes Absolute: 0.8 10*3/uL (ref 0.1–0.9)
Monocytes: 9 %
Neutrophils Absolute: 5 10*3/uL (ref 1.4–7.0)
Neutrophils: 55 %
Platelets: 230 10*3/uL (ref 150–450)
RBC: 5.03 x10E6/uL (ref 3.77–5.28)
RDW: 13.7 % (ref 11.7–15.4)
WBC: 9.1 10*3/uL (ref 3.4–10.8)

## 2020-07-19 LAB — HEMOGLOBIN A1C
Est. average glucose Bld gHb Est-mCnc: 146 mg/dL
Hgb A1c MFr Bld: 6.7 % — ABNORMAL HIGH (ref 4.8–5.6)

## 2020-07-19 LAB — CARDIOVASCULAR RISK ASSESSMENT

## 2020-07-19 NOTE — Chronic Care Management (AMB) (Signed)
  Chronic Care Management   Note  07/19/2020 Name: Brandi Velazquez MRN: [id number] DOB: 18-Aug-1961  Brandi Velazquez is a 59 y.o. year old female who is a primary care patient of [name], Kirsten, MD. I reached out to Brandi Velazquez by phone today in response to a referral sent by Ms. Daun Peacock PCP, [name], Kirsten, MD.   Ms. Cogan was given information about Chronic Care Management services today including:  1. CCM service includes personalized support from designated clinical staff supervised by her physician, including individualized plan of care and coordination with other care providers 2. 24/7 contact phone numbers for assistance for urgent and routine care needs. 3. Service will only be billed when office clinical staff spend 20 minutes or more in a month to coordinate care. 4. Only one practitioner may furnish and bill the service in a calendar month. 5. The patient may stop CCM services at any time (effective at the end of the month) by phone call to the office staff.   Patient agreed to services and verbal consent obtained.   Follow up plan:   [place]

## 2020-07-22 ENCOUNTER — Other Ambulatory Visit: Payer: Self-pay | Admitting: Family Medicine

## 2020-07-24 LAB — POCT UA - MICROALBUMIN: Microalbumin Ur, POC: 30 mg/L

## 2020-08-01 ENCOUNTER — Telehealth: Payer: Self-pay

## 2020-08-01 NOTE — Chronic Care Management (AMB) (Signed)
Chronic Care Management Pharmacy Assistant   Name: Brandi Velazquez  MRN: [id number] DOB: 1961-05-24  Reason for Encounter: Medication Review-  Initial Questions for Pharmacist Visit on 08/03/2020.  Have you seen any other providers since your last visit? Yes. 07/18/2020 [name] [name],MD -PCP- follow up 3 months  Any changes in your medications or health? No.  Any side effects from any medications? No  Do you have an symptoms or problems not managed by your medications? No  Any concerns about your health right now? Would like to discuss new walker, states she would like to have some information. Patient states her quality of life would be better if she could get around to go places.  Has your provider asked that you check blood pressure, blood sugar, or follow special diet at home? No.   Do you get any type of exercise on a regular basis ? No  Can you think of a goal you would like to reach for your health? To live the best life she can live .  Do you have any problems getting your medications? Janument 50- 1000mg   Is there anything that you would like to discuss during the appointment?  Patient would like to discuss getting a mobility power chair and help with medication assistance for Janument , states she has been getting samples from Dr. Alyse Low office.  Patient is aware to have all  medications and supplements at time of phone visit.      PCP : [name], MD  Allergies:   Allergies  Allergen Reactions  . Bactrim [Sulfamethoxazole-Trimethoprim] Swelling  . Morphine And Related     Medications: Outpatient Encounter Medications as of 08/01/2020  Medication Sig  . aspirin EC 81 MG tablet Take 81 mg by mouth daily.  Marland Kitchen BLACK COHOSH EXTRACT PO Take by mouth daily.  Marland Kitchen glimepiride (AMARYL) 4 MG tablet Take 1 tablet by mouth twice daily  . glucose blood (RELION GLUCOSE TEST STRIPS) test strip Use as instructed  . HYDROcodone-acetaminophen (NORCO/VICODIN) 5-325 MG tablet  Take 1 tablet by mouth every 6 (six) hours as needed for moderate pain.  Marland Kitchen ibuprofen (ADVIL) 800 MG tablet Take 1 tablet (800 mg total) by mouth every 8 (eight) hours as needed.  . metFORMIN (GLUCOPHAGE) 1000 MG tablet Take 1 tablet (1,000 mg total) by mouth 2 (two) times daily with a meal.  . Multiple Vitamin (MULTIVITAMIN) tablet Take 1 tablet by mouth daily.  Marland Kitchen NOVOLIN 70/30 RELION (70-30) 100 UNIT/ML injection INJECT 45 UNITS SUBCUTANEOUSLY BEFORE BREAKFAST AND 50 UNITS BEFORE SUPPER  . pioglitazone (ACTOS) 30 MG tablet Take 1 tablet by mouth once daily  . pravastatin (PRAVACHOL) 40 MG tablet TAKE 1 TABLET BY MOUTH AT BEDTIME  . ramipril (ALTACE) 2.5 MG capsule Take 1 capsule by mouth once daily  . spironolactone (ALDACTONE) 25 MG tablet Take 1 tablet by mouth once daily  . traMADol (ULTRAM) 50 MG tablet Take 1 - 2 tablets every 6 hours as needed for pain   No facility-administered encounter medications on file as of 08/01/2020.    Current Diagnosis: Patient Active Problem List   Diagnosis Date Noted  . Diabetes mellitus ([place]) 04/17/2020  . Hyperlipidemia   . Hypertension   . GERD (gastroesophageal reflux disease)   . Acute left-sided low back pain without sciatica 01/10/2020  . Back pain of lumbar region with sciatica 01/07/2020  . Other acute recurrent sinusitis 10/29/2019  . Cough 10/29/2019  . Varicose veins of lower extremities with  other complications 59/56/3875  . Varicose veins of lower extremities with inflammation 06/14/2011     Follow-Up:  Pharmacist Review - New Application filled out to [employer] for [employer] [place] [employer] . Awaiting for provider and patient's signature and proof of income.  Reviewed Chart and Adherence Measures. Per Insurance data Med Compliance CHOL 90-99%/ Med Compliance HTN 90-99%.  Elly Modena Owens Shark, [place] Notified.  Judithann Sheen, [facility] Clinical Pharmacist Assistant [phone]

## 2020-08-01 NOTE — Chronic Care Management (AMB) (Deleted)
Chronic Care Management Pharmacy  Name: Brandi Velazquez  MRN: [id number] DOB: 06/08/1961  Chief Complaint/ HPI  Brandi Velazquez,  59 y.o. , female presents for their Initial CCM visit with the clinical pharmacist {CHL HP Upstream Pharm visit NKNL:[id number]}.  PCP : [name], MD  Their chronic conditions include: hypertension, GERD, diabetes, sciatica. COPD, hyperlipidemia.   Office Visits:*** 07/18/2020 - lotrimin cream for tinea corporis. Flu shot given.  07/06/2020 - acute visit/follow-up to urgent care for broken toe - rinse right great toe with saline daily. Take tylenol or ibuprofen as needed for pain. Keep appt with podiatrist. 04/15/2020 - refer to podiatry for plantar wart. Continue healthy diet and exercise.  Consult Visit:*** 07/13/2020 - Podiatry - wart removed and medication applies to prevent from recurrence. Cleaned toe wound. Will x-ray toe on follow-up.  Medications: Outpatient Encounter Medications as of 08/03/2020  Medication Sig  . aspirin EC 81 MG tablet Take 81 mg by mouth daily.  Marland Kitchen BLACK COHOSH EXTRACT PO Take by mouth daily.  Marland Kitchen glimepiride (AMARYL) 4 MG tablet Take 1 tablet by mouth twice daily  . glucose blood (RELION GLUCOSE TEST STRIPS) test strip Use as instructed  . HYDROcodone-acetaminophen (NORCO/VICODIN) 5-325 MG tablet Take 1 tablet by mouth every 6 (six) hours as needed for moderate pain.  Marland Kitchen ibuprofen (ADVIL) 800 MG tablet Take 1 tablet (800 mg total) by mouth every 8 (eight) hours as needed.  . metFORMIN (GLUCOPHAGE) 1000 MG tablet Take 1 tablet (1,000 mg total) by mouth 2 (two) times daily with a meal.  . Multiple Vitamin (MULTIVITAMIN) tablet Take 1 tablet by mouth daily.  Marland Kitchen NOVOLIN 70/30 RELION (70-30) 100 UNIT/ML injection INJECT 45 UNITS SUBCUTANEOUSLY BEFORE BREAKFAST AND 50 UNITS BEFORE SUPPER  . pioglitazone (ACTOS) 30 MG tablet Take 1 tablet by mouth once daily  . pravastatin (PRAVACHOL) 40 MG tablet TAKE 1 TABLET BY MOUTH AT BEDTIME   . ramipril (ALTACE) 2.5 MG capsule Take 1 capsule by mouth once daily  . spironolactone (ALDACTONE) 25 MG tablet Take 1 tablet by mouth once daily  . traMADol (ULTRAM) 50 MG tablet Take 1 - 2 tablets every 6 hours as needed for pain   No facility-administered encounter medications on file as of 08/03/2020.   Allergies  Allergen Reactions  . Bactrim [Sulfamethoxazole-Trimethoprim] Swelling  . Morphine And Related    SDOH Screenings   Alcohol Screen:   . Last Alcohol Screening Score (AUDIT): Not on file  Depression (PHQ2-9): Low Risk   . PHQ-2 Score: 0  Financial Resource Strain:   . Difficulty of Paying Living Expenses: Not on file  Food Insecurity:   . Worried About Charity fundraiser in the Last Year: Not on file  . Ran Out of Food in the Last Year: Not on file  Housing:   . Last Housing Risk Score: Not on file  Physical Activity:   . Days of Exercise per Week: Not on file  . Minutes of Exercise per Session: Not on file  Social Connections:   . Frequency of Communication with Friends and Family: Not on file  . Frequency of Social Gatherings with Friends and Family: Not on file  . Attends Religious Services: Not on file  . Active Member of Clubs or Organizations: Not on file  . Attends Archivist Meetings: Not on file  . Marital Status: Not on file  Stress:   . Feeling of Stress : Not on file  Tobacco Use: Medium Risk  .  Smoking Tobacco Use: Former Smoker  . Smokeless Tobacco Use: Never Used  Transportation Needs:   . Film/video editor (Medical): Not on file  . Lack of Transportation (Non-Medical): Not on file     Current Diagnosis/Assessment:  Goals Addressed   None     Diabetes   Recent Relevant Labs: Lab Results  Component Value Date/Time   HGBA1C 6.7 (H) 07/18/2020 10:51 AM   HGBA1C 6.7 (H) 04/15/2020 11:03 AM   MICROALBUR 30 07/24/2020 11:45 PM     Checking BG: {CHL HP Blood Glucose Monitoring Frequency:[phone]}  Recent FBG  Readings: Recent pre-meal BG readings: *** Recent 2hr PP BG readings:  *** Recent HS BG readings: *** Patient has failed these meds in past: Levemir, Lantus, Janumet Patient is currently {CHL Controlled/Uncontrolled:[phone]} on the following medications: ***  Glimepiride 4 mg bid  Metformin 1000 mg bid  Janumet****  Novoloin 70/30 45 units before breakfast and 50 units before supper  Pioglitazone 30 mg 1 tablet by mouth daily   Reli-on blood glucose test   Last diabetic Foot exam:  Lab Results  Component Value Date/Time   HMDIABEYEEXA No Retinopathy 03/07/2020 12:00 AM    Last diabetic Eye exam: No results found for: HMDIABFOOTEX   We discussed: {CHL HP Upstream Pharmacy discussion:[id number]}  Plan  Continue {CHL HP Upstream Pharmacy Plans:[phone]} and  Hypertension   BP today is:  {CHL HP UPSTREAM Pharmacist BP [phone]}  Office blood pressures are  BP Readings from Last 3 Encounters:  07/18/20 118/60  07/06/20 128/68  04/15/20 (!) 132/82    Patient has failed these meds in the past: *** Patient is currently controlled/uncontrolled*** on the following medications: ***  Ramipril 2.5 mg daily   Spironolactone 25 mg daily  Patient checks BP at home {CHL HP BP Monitoring Frequency:[phone]}  Patient home BP readings are ranging: ***  We discussed {CHL HP Upstream Pharmacy discussion:[id number]}  Plan  Continue {CHL HP Upstream Pharmacy Plans:[phone]}   Hyperlipidemia   LDL goal < ***  Last lipids Lab Results  Component Value Date   CHOL 163 07/18/2020   HDL 56 07/18/2020   LDLCALC 94 07/18/2020   TRIG 67 07/18/2020   CHOLHDL 2.9 07/18/2020   Hepatic Function Latest Ref Rng & Units 07/18/2020 04/15/2020  Total Protein 6.0 - 8.5 g/dL 7.2 6.6  Albumin 3.8 - 4.9 g/dL 4.4 4.2  AST 0 - 40 IU/L 14 15  ALT 0 - 32 IU/L 17 17  Alk Phosphatase 44 - 121 IU/L 57 61  Total Bilirubin 0.0 - 1.2 mg/dL 0.2 0.2     The 10-year ASCVD risk  score Mikey Bussing DC Jr., et al., 2013) is: 6.1%   Values used to calculate the score:     Age: 21 years     Sex: Female     Is Non-Hispanic African American: No     Diabetic: Yes     Tobacco smoker: No     Systolic Blood Pressure: 485 mmHg     Is BP treated: Yes     HDL Cholesterol: 56 mg/dL     Total Cholesterol: 163 mg/dL   Patient has failed these meds in past: *** Patient is currently {CHL Controlled/Uncontrolled:[phone]} on the following medications:  . Aspirin 81 mg daily  . Pravastatin 40 mg daily at bedtime  We discussed:  {CHL HP Upstream Pharmacy discussion:[id number]}  Plan  Continue {CHL HP Upstream Pharmacy IOEVO:[id number]}   ***   Patient has failed these meds in past: *** Patient is currently {  CHL Controlled/[phone]} on the following medications:  . ***Hydrocodone 5-325 mg every 6 hours prn moderate pain . Tramadol 50 mg 1-2 tablets every 6 hours prn pain . Ibuprofen 800 mg every 8 hours prn  We discussed:  ***  Plan  Continue {CHL HP Upstream Pharmacy Plans:[phone]}   Health Maintenance   Patient is currently {CHL Controlled/[phone]} on the following medications:  . ***Multiple vitamin daily - *** . Black cohosh daily - ***  We discussed:  ***  Plan  Continue {CHL HP Upstream Pharmacy HKFEX:[id number]}  Vaccines   Reviewed and discussed patient's vaccination history.    Immunization History  Administered Date(s) Administered  . Influenza Inj Mdck Quad Pf 07/18/2020  . Moderna SARS-COVID-2 Vaccination 10/07/2019, 11/02/2019  . Pneumococcal Polysaccharide-23 10/09/2011    Plan  Recommended patient receive *** vaccine in *** office.   Medication Management   Pt uses [facility] for all medications Uses pill box? {Yes or If no, why not?:20788} Pt endorses ***% compliance  We discussed: {Pharmacy options:24294}  Plan  {US Pharmacy MBBU:03709}    Follow up: *** month phone  visit  ***

## 2020-08-03 ENCOUNTER — Telehealth: Payer: Medicare HMO

## 2020-08-03 ENCOUNTER — Telehealth: Payer: Self-pay | Admitting: Family Medicine

## 2020-08-03 NOTE — Chronic Care Management (AMB) (Signed)
  Chronic Care Management   Note  08/03/2020 Name: Brandi Velazquez MRN: [id number] DOB: 1961-04-11  Brandi Velazquez is a 59 y.o. year old female who is a primary care patient of [name], Kirsten, MD. I reached out to Brandi Velazquez by phone today in response to a referral sent by Ms. Daun Peacock PCP, [name], Kirsten, MD.   Ms. Goold was given information about Chronic Care Management services today including:  1. CCM service includes personalized support from designated clinical staff supervised by her physician, including individualized plan of care and coordination with other care providers 2. 24/7 contact phone numbers for assistance for urgent and routine care needs. 3. Service will only be billed when office clinical staff spend 20 minutes or more in a month to coordinate care. 4. Only one practitioner may furnish and bill the service in a calendar month. 5. The patient may stop CCM services at any time (effective at the end of the month) by phone call to the office staff.   Patient agreed to services and verbal consent obtained.   Follow up plan:   [place]

## 2020-08-05 ENCOUNTER — Telehealth: Payer: Self-pay

## 2020-08-05 NOTE — Chronic Care Management (AMB) (Signed)
    Chronic Care Management Pharmacy Assistant   Name: Brandi Velazquez  MRN: [id number] DOB: [birth year]  Reason for Encounter: Medication Review - Patient Assistance Coordination.    PCP : [name], MD  Allergies:   Allergies  Allergen Reactions  . Bactrim [Sulfamethoxazole-Trimethoprim] Swelling  . Morphine And Related     Medications: Outpatient Encounter Medications as of 08/05/2020  Medication Sig  . aspirin EC 81 MG tablet Take 81 mg by mouth daily.  Marland Kitchen BLACK COHOSH EXTRACT PO Take by mouth daily.  Marland Kitchen glimepiride (AMARYL) 4 MG tablet Take 1 tablet by mouth twice daily  . glucose blood (RELION GLUCOSE TEST STRIPS) test strip Use as instructed  . HYDROcodone-acetaminophen (NORCO/VICODIN) 5-325 MG tablet Take 1 tablet by mouth every 6 (six) hours as needed for moderate pain.  Marland Kitchen ibuprofen (ADVIL) 800 MG tablet Take 1 tablet (800 mg total) by mouth every 8 (eight) hours as needed.  . metFORMIN (GLUCOPHAGE) 1000 MG tablet Take 1 tablet (1,000 mg total) by mouth 2 (two) times daily with a meal.  . Multiple Vitamin (MULTIVITAMIN) tablet Take 1 tablet by mouth daily.  Marland Kitchen NOVOLIN 70/30 RELION (70-30) 100 UNIT/ML injection INJECT 45 UNITS SUBCUTANEOUSLY BEFORE BREAKFAST AND 50 UNITS BEFORE SUPPER  . pioglitazone (ACTOS) 30 MG tablet Take 1 tablet by mouth once daily  . pravastatin (PRAVACHOL) 40 MG tablet TAKE 1 TABLET BY MOUTH AT BEDTIME  . ramipril (ALTACE) 2.5 MG capsule Take 1 capsule by mouth once daily  . spironolactone (ALDACTONE) 25 MG tablet Take 1 tablet by mouth once daily  . traMADol (ULTRAM) 50 MG tablet Take 1 - 2 tablets every 6 hours as needed for pain   No facility-administered encounter medications on file as of 08/05/2020.    Current Diagnosis: Patient Active Problem List   Diagnosis Date Noted  . Diabetes mellitus ([place]) 04/17/2020  . Hyperlipidemia   . Hypertension   . GERD (gastroesophageal reflux disease)   . Acute left-sided low back pain without  sciatica 01/10/2020  . Back pain of lumbar region with sciatica 01/07/2020  . Other acute recurrent sinusitis 10/29/2019  . Cough 10/29/2019  . Varicose veins of lower extremities with other complications 03/00/9233  . Varicose veins of lower extremities with inflammation 06/14/2011      Follow-Up:  Patient Assistance Coordination - Called patient to let her know that I had filled out patient assistance form for [employer] , and she will need to come to office to sign and bring proof of income. Patient missed initial visit 08/03/2020.  Elly Modena Brown,CPP. Notified  Judithann Sheen, [facility] Pharmacist Assistant [phone]

## 2020-08-09 ENCOUNTER — Other Ambulatory Visit: Payer: Self-pay

## 2020-08-09 ENCOUNTER — Ambulatory Visit (INDEPENDENT_AMBULATORY_CARE_PROVIDER_SITE_OTHER): Payer: Medicare HMO

## 2020-08-09 ENCOUNTER — Encounter: Payer: Self-pay | Admitting: Sports Medicine

## 2020-08-09 ENCOUNTER — Ambulatory Visit: Payer: Medicare HMO | Admitting: Sports Medicine

## 2020-08-09 DIAGNOSIS — S99921A Unspecified injury of right foot, initial encounter: Secondary | ICD-10-CM | POA: Diagnosis not present

## 2020-08-09 DIAGNOSIS — S99921D Unspecified injury of right foot, subsequent encounter: Secondary | ICD-10-CM

## 2020-08-09 DIAGNOSIS — L608 Other nail disorders: Secondary | ICD-10-CM

## 2020-08-09 DIAGNOSIS — B070 Plantar wart: Secondary | ICD-10-CM | POA: Diagnosis not present

## 2020-08-09 DIAGNOSIS — E0800 Diabetes mellitus due to underlying condition with hyperosmolarity without nonketotic hyperglycemic-hyperosmolar coma (NKHHC): Secondary | ICD-10-CM | POA: Diagnosis not present

## 2020-08-09 NOTE — Progress Notes (Signed)
Subjective: Brandi Velazquez is a 59 y.o. female patient who presents to office for follow-up evaluation of right foot wart and for nail check at right great toe.  Patient reports that her right foot seems to be doing better and reports that she soak with warm water and Epson salt and slowly the big toenail seems to be healing up.  Patient denies pain any redness warmth swelling or drainage.  Patient is diabetic blood sugar not recorded for today.  Patient Active Problem List   Diagnosis Date Noted  . Diabetes mellitus ([place]) 04/17/2020  . Hyperlipidemia   . Hypertension   . GERD (gastroesophageal reflux disease)   . Acute left-sided low back pain without sciatica 01/10/2020  . Back pain of lumbar region with sciatica 01/07/2020  . Other acute recurrent sinusitis 10/29/2019  . Cough 10/29/2019  . Varicose veins of lower extremities with other complications 63/09/6008  . Varicose veins of lower extremities with inflammation 06/14/2011    Current Outpatient Medications on File Prior to Visit  Medication Sig Dispense Refill  . aspirin EC 81 MG tablet Take 81 mg by mouth daily.    Marland Kitchen BLACK COHOSH EXTRACT PO Take by mouth daily.    Marland Kitchen glimepiride (AMARYL) 4 MG tablet Take 1 tablet by mouth twice daily 180 tablet 1  . glucose blood (RELION GLUCOSE TEST STRIPS) test strip Use as instructed 100 each 2  . HYDROcodone-acetaminophen (NORCO/VICODIN) 5-325 MG tablet Take 1 tablet by mouth every 6 (six) hours as needed for moderate pain. 30 tablet 0  . ibuprofen (ADVIL) 800 MG tablet Take 1 tablet (800 mg total) by mouth every 8 (eight) hours as needed. 30 tablet 0  . metFORMIN (GLUCOPHAGE) 1000 MG tablet Take 1 tablet (1,000 mg total) by mouth 2 (two) times daily with a meal. 60 tablet 1  . Multiple Vitamin (MULTIVITAMIN) tablet Take 1 tablet by mouth daily.    Marland Kitchen NOVOLIN 70/30 RELION (70-30) 100 UNIT/ML injection INJECT 45 UNITS SUBCUTANEOUSLY BEFORE BREAKFAST AND 50 UNITS BEFORE SUPPER 30 mL 2  .  pioglitazone (ACTOS) 30 MG tablet Take 1 tablet by mouth once daily 90 tablet 0  . pravastatin (PRAVACHOL) 40 MG tablet TAKE 1 TABLET BY MOUTH AT BEDTIME 90 tablet 0  . ramipril (ALTACE) 2.5 MG capsule Take 1 capsule by mouth once daily 90 capsule 2  . spironolactone (ALDACTONE) 25 MG tablet Take 1 tablet by mouth once daily 90 tablet 0  . traMADol (ULTRAM) 50 MG tablet Take 1 - 2 tablets every 6 hours as needed for pain     No current facility-administered medications on file prior to visit.    Allergies  Allergen Reactions  . Bactrim [Sulfamethoxazole-Trimethoprim] Swelling  . Morphine And Related     Objective:  General: Alert and oriented x3 in no acute distress  Dermatology: Keratotic lesion submet 4 on right has now resolved previous wart resolved.  No ecchymosis bilateral, all nails x 10 are well manicured except right hallux nail with there is dried blood at the proximal nail bed this blood is stable nail appears to be well attached no evidence of loosening and no acute signs of infection.  Vascular: Dorsalis Pedis and Posterior Tibial pedal pulses 1/4, Capillary Fill Time 3 seconds, + pedal hair growth bilateral, no edema bilateral lower extremities, Temperature gradient within normal limits.  Neurology: Johney Maine sensation intact via light touch bilateral.  Musculoskeletal: No tenderness to palpation bilateral.  X-rays right foot no acute findings at right great toe  previous history of dropping a jar on the foot and injuring the right great toenail.  Assessment and Plan: Problem List Items Addressed This Visit      Endocrine   Diabetes mellitus ([place])    Other Visit Diagnoses    Injury of toe on right foot, subsequent encounter    -  Primary   06/25/20   Relevant Orders   DG Foot Complete Right   Hemorrhage of nail       Plantar wart, right foot          -Complete examination performed -X-rays reviewed -Discussed continued care for hemorrhage of nail and for  resolving wart -Advised patient that since there is no fracture may continue with normal shoes on the right and continue with monitoring of the right great toenail over time the nail may fall off due to the nature of the trauma that she encountered at her right great toe -Right foot wart has resolved advised patient to apply a small amount of Vaseline and to allow dry skin to peel off on its own -May return to getting self pedicures -Patient to return to office as needed or sooner if condition worsens.  Landis Martins, DPM

## 2020-08-26 ENCOUNTER — Other Ambulatory Visit: Payer: Self-pay

## 2020-08-26 MED ORDER — METFORMIN HCL 1000 MG PO TABS
1000.0000 mg | ORAL_TABLET | Freq: Two times a day (BID) | ORAL | 1 refills | Status: DC
Start: 2020-08-26 — End: 2020-10-10

## 2020-08-31 DIAGNOSIS — H40013 Open angle with borderline findings, low risk, bilateral: Secondary | ICD-10-CM | POA: Diagnosis not present

## 2020-09-13 ENCOUNTER — Other Ambulatory Visit: Payer: Self-pay | Admitting: Family Medicine

## 2020-09-28 NOTE — Chronic Care Management (AMB) (Signed)
Chronic Care Management Pharmacy  Name: Brandi Velazquez  MRN: [id number] DOB: 10-13-1960  Chief Complaint/ HPI  Brandi Velazquez,  60 y.o. , female presents for their Initial CCM visit with the clinical pharmacist via telephone due to COVID-19 Pandemic.  PCP : [name], MD  Their chronic conditions include: GERD, Diabetes Mellitus, Cough, Hyperlipidemia, depression, chronic pain.   Plan Recommendations:     Recommend Tresiba 30 units at bedtime and Ozempic 0.25 mg weekly for 1 month then increasing to 0.5 mg weekly if tolerated with ultimate goal of increasing to 1 mg after 1 month of tolerating 0.5 mg. Continue metformin 1000 mg bid. Consider discontinuing Actos and Glimepiride once titrated on Ozempic. Recommended patient begin checking blood sugar at least once daily again.   Consider discontinuing spironolactone.   Patient is interested in a motorized scooter and possibly PT for back/mobility. Recommended scheduling a visit with Dr. Tobie Poet to discuss.    Office Visits: 07/18/2020 - lotrimin cream for tinea corporis, flu shot given. Continue ibuprofen.  07/06/2020 - toe fracture - monitor for signs/symptoms of infection. Tylenol or ibuprofen as needed.  04/15/2020 - refer to podiatry.  Consult Visit: 08/09/2020 - podiatry - may return to normal shoes. Continue monitoring toenail since may fall off. Wart has resolved.  07/13/2020 - podiatry - wart treatment. Clenase toe well and continue with surgical shoe.   Medications: Outpatient Encounter Medications as of 10/06/2020  Medication Sig  . glimepiride (AMARYL) 4 MG tablet Take 1 tablet by mouth twice daily (Patient taking differently: Take 8 mg by mouth daily with breakfast.)  . glucose blood (RELION GLUCOSE TEST STRIPS) test strip Use as instructed  . ibuprofen (ADVIL) 800 MG tablet Take 1 tablet (800 mg total) by mouth every 8 (eight) hours as needed.  . metFORMIN (GLUCOPHAGE) 1000 MG tablet Take 1 tablet (1,000 mg total) by  mouth 2 (two) times daily with a meal.  . Multiple Vitamin (MULTIVITAMIN) tablet Take 1 tablet by mouth daily.  Marland Kitchen NOVOLIN 70/30 RELION (70-30) 100 UNIT/ML injection INJECT 45 UNITS SUBCUTANEOUSLY BEFORE BREAKFAST AND 50 UNITS BEFORE SUPPER (Patient taking differently: Inject 40 units subcutaneously before breakfast and 50 units before supper)  . pioglitazone (ACTOS) 30 MG tablet Take 1 tablet by mouth once daily  . pravastatin (PRAVACHOL) 40 MG tablet TAKE 1 TABLET BY MOUTH AT BEDTIME  . ramipril (ALTACE) 2.5 MG capsule Take 1 capsule by mouth once daily  . spironolactone (ALDACTONE) 25 MG tablet Take 1 tablet by mouth once daily  . traMADol (ULTRAM) 50 MG tablet Take 1 - 2 tablets every 6 hours as needed for pain  . BLACK COHOSH EXTRACT PO Take by mouth daily. (Patient not taking: Reported on 10/06/2020)  . HYDROcodone-acetaminophen (NORCO/VICODIN) 5-325 MG tablet Take 1 tablet by mouth every 6 (six) hours as needed for moderate pain. (Patient not taking: Reported on 10/06/2020)  . [DISCONTINUED] aspirin EC 81 MG tablet Take 81 mg by mouth daily. (Patient not taking: Reported on 10/06/2020)   No facility-administered encounter medications on file as of 10/06/2020.   Allergies  Allergen Reactions  . Bactrim [Sulfamethoxazole-Trimethoprim] Swelling  . Morphine And Related    SDOH Screenings   Alcohol Screen: Not on file  Depression (PHQ2-9): Low Risk   . PHQ-2 Score: 0  Financial Resource Strain: Not on file  Food Insecurity: Unknown  . Worried About Charity fundraiser in the Last Year: Not on file  . Ran Out of Food in the Last  Year: Never true  Housing: Not on file  Physical Activity: Not on file  Social Connections: Not on file  Stress: Not on file  Tobacco Use: Medium Risk  . Smoking Tobacco Use: Former Smoker  . Smokeless Tobacco Use: Never Used  Transportation Needs: No Transportation Needs  . Lack of Transportation (Medical): No  . Lack of Transportation (Non-Medical): No       Current Diagnosis/Assessment:  Goals Addressed            This Visit's Progress   . Pharmacy Care Plan       CARE PLAN ENTRY (see longitudinal plan of care for additional care plan information)  Current Barriers:  . Chronic Disease Management support, education, and care coordination needs related to Hypertension, Hyperlipidemia, and Diabetes   Hypertension BP Readings from Last 3 Encounters:  07/18/20 118/60  07/06/20 128/68  04/15/20 (!) 132/82   . Pharmacist Clinical Goal(s): o Over the next 90 days, patient will work with PharmD and providers to maintain BP goal <130/80 . Current regimen:   Ramipril 2.5 mg daily   Spironolactone 25 mg daily . Interventions: o Patient states that blood pressure stays well controlled.  o Patient missed 1 week of spironolactone and denies any swelling. Would like to consider discontinuing if patient agrees.  . Patient self care activities - Over the next 90 days, patient will: o Ensure daily salt intake < 2300 mg/day  Hyperlipidemia Lab Results  Component Value Date/Time   LDLCALC 94 07/18/2020 10:51 AM   . Pharmacist Clinical Goal(s): o Over the next 90 days, patient will work with PharmD and providers to maintain LDL goal < 100 . Current regimen:  . Pravastatin 40 mg daily at bedtime . Interventions: o Discussed benefit of weight loss and exercise on controlling cholesterol and reducing cardiovascular effects.  . Patient self care activities - Over the next 90 days, patient will: o Continue current medication as prescribed.   Diabetes Lab Results  Component Value Date/Time   HGBA1C 6.7 (H) 07/18/2020 10:51 AM   HGBA1C 6.7 (H) 04/15/2020 11:03 AM   . Pharmacist Clinical Goal(s): o Over the next 90 days, patient will work with PharmD and providers to maintain A1c goal <7% . Current regimen:   Glimepiride 4 mg bid  Metformin 1000 mg bid  Novoloin 70/30 45 units before breakfast and 50 units before  supper  Pioglitazone 30 mg 1 tablet by mouth daily   Reli-on blood glucose test  . Interventions: o Reviewed blood sugar regimen and options.  o Pharmacist will coordinate patient assistance for regimen.  o Recommend considering Tresiba 30 units daily and Ozempic 0.25 mg weekly (increasing every 4 weeks to ultimate goal of 1 mg weekly).  o Recommended patient begin testing blood sugar once daily.  . Patient self care activities - Over the next 90 days, patient will: o Check blood sugar once daily, document, and provide at future appointments o Contact provider with any episodes of hypoglycemia  Medication management . Pharmacist Clinical Goal(s): o Over the next 90 days, patient will work with PharmD and providers to achieve optimal medication adherence . Current pharmacy: [facility] . Interventions o Comprehensive medication review performed. o Utilize [facility] pharmacy for medication synchronization, packaging and delivery . Patient self care activities - Over the next 90 days, patient will: o Focus on medication adherence by pharmacy delivery and coordination o Take medications as prescribed o Report any questions or concerns to PharmD and/or provider(s)  Initial goal documentation  Diabetes   Recent Relevant Labs: Lab Results  Component Value Date/Time   HGBA1C 6.7 (H) 07/18/2020 10:51 AM   HGBA1C 6.7 (H) 04/15/2020 11:03 AM   MICROALBUR 30 07/24/2020 11:45 PM     Checking BG: Rarely  Patient has failed these meds in past: janumet 50/1000 mg bid Patient is currently controlled on the following medications:   Glimepiride 4 mg bid   Metformin 1000 mg bid  Novolin 70/30 40 units before breakfast and 50 units before supper  Pioglitazone 30 mg daily   Relion glucose test strips  Last diabetic Foot exam: 07/2020  Last diabetic Eye exam: No results found for: Lab Results  Component Value Date/Time   HMDIABEYEEXA No Retinopathy 03/07/2020 12:00 AM      We discussed: diet and exercise extensively   Exercise: Struggles with mobility due to back issues.   Diet:  Eats twice a day. Supper last night was chicken, noodles and peas. Doesn't eat a lot of sweets. Tries to eat a regular portion size. States she has struggled to lose weight. Eggs and breakfast meat with toast for breakfast. Drinks water or diet sun drop. Drinks milk occasionally.   Reviewed options for diabetes management through different medications and discussed patient's goal of weight loss and well managed blood sugar. Affordability is a concern and patient was in the doughnut hole last year in August. Patient is interested/willing to try Ozempic and basal insulin. Patient currently pays ~$80 a month for 70/30 insulin from [facility].   Patient notes that she has had low blood sugar in the past and   Plan  Recommend Tresiba 30 units at bedtime and Ozempic 0.25 mg weekly for 1 month then increasing to 0.5 mg weekly if tolerated with ultimate goal of increasing to 1 mg after 1 month of tolerating 0.5 mg. Continue metformin 1000 mg bid. Consider discontinuing Actos and Glimepiride once titrated on Ozempic.   Hypertension   BP today is:  Unsure   Office blood pressures are  BP Readings from Last 3 Encounters:  07/18/20 118/60  07/06/20 128/68  04/15/20 (!) 132/82    Patient has failed these meds in the past: furosemide Patient is currently controlled on the following medications:   Ramipril 2.5 mg daily  Spironolactone 25 mg daily   Patient checks BP at home infrequently  Patient home BP readings are ranging: states always well controlled   We discussed diet and exercise extensively. Missed 1 week of medication a few weeks ago without swelling or any issues noted. Patient would like to consider discontinuing spironolactone after missing 1 week with no issues with swelling. Patient denies issues with blood pressure and takes ramipril for kidney protection.    Plan Consider discontinuing spironolactone.      Hyperlipidemia   LDL goal < 100  Last lipids Lab Results  Component Value Date   CHOL 163 07/18/2020   HDL 56 07/18/2020   LDLCALC 94 07/18/2020   TRIG 67 07/18/2020   CHOLHDL 2.9 07/18/2020   Hepatic Function Latest Ref Rng & Units 07/18/2020 04/15/2020  Total Protein 6.0 - 8.5 g/dL 7.2 6.6  Albumin 3.8 - 4.9 g/dL 4.4 4.2  AST 0 - 40 IU/L 14 15  ALT 0 - 32 IU/L 17 17  Alk Phosphatase 44 - 121 IU/L 57 61  Total Bilirubin 0.0 - 1.2 mg/dL 0.2 0.2     The 10-year ASCVD risk score Mikey Bussing DC Jr., et al., 2013) is: 6.1%   Values used to calculate the  score:     Age: 6 years     Sex: Female     Is Non-Hispanic African American: No     Diabetic: Yes     Tobacco smoker: No     Systolic Blood Pressure: 212 mmHg     Is BP treated: Yes     HDL Cholesterol: 56 mg/dL     Total Cholesterol: 163 mg/dL   Patient has failed these meds in past: none reported Patient is currently controlled on the following medications:  . Pravastatin 40 mg daily at bedtime   We discussed:  diet and exercise extensively. Discussed that weight loss and improved blood sugar control would reduce risk of cardiovascular complications. Patient is unable to exercise due to back problem and mobility.   Plan  Continue current medications   Pain   Patient has failed these meds in past: hydrocodone  Patient is currently controlled on the following medications:  . Ibuprofen 800 mg every 8 hours prn  . Tramadol 50 mg 1-2 tablet every 6 hours prn pain  We discussed:  Patient states that she is managed by ibuprofen and tramadol as needed for back pain. Patient states mobility is a big concern. She is interested in talking to Dr. Tobie Poet about a motorized wheel chair. Discussed that she would need a visit with Dr. Tobie Poet to begin this process.   Plan  Continue current medications    Health Maintenance   Patient is currently controlled on the following  medications:  Marland Kitchen Multivitamin daily - supplementation   We discussed:  Patient denies concerns.   Plan  Continue current medications  Vaccines   Reviewed and discussed patient's vaccination history.    Immunization History  Administered Date(s) Administered  . Influenza Inj Mdck Quad Pf 07/18/2020  . Moderna Sars-Covid-2 Vaccination 10/07/2019, 11/02/2019  . Pneumococcal Polysaccharide-23 10/09/2011    Plan  Recommended patient receive COVID third dose.   Medication Management   Patient's preferred pharmacy is:  [facility], [address] [place] [address] Phone: [phone] Fax: [phone]  Uses pill box? Yes Pt endorses good compliance but states she occasionally misses doses of medication.   We discussed: Discussed benefits of medication synchronization, packaging and delivery as well as enhanced pharmacist oversight with [facility].  Plan  Utilize [facility] pharmacy for medication synchronization, packaging and delivery    Follow up: 1 month phone visit

## 2020-10-03 ENCOUNTER — Telehealth: Payer: Self-pay

## 2020-10-03 NOTE — Progress Notes (Signed)
Chronic Care Management Pharmacy Assistant   Name: Brandi Velazquez  MRN: [id number] DOB: 19-Feb-1961  Reason for Encounter: Initial questions for appointment with Donette Larry, CPP  PCP : [name], MD  Allergies:   Allergies  Allergen Reactions   Bactrim [Sulfamethoxazole-Trimethoprim] Swelling   Morphine And Related     Medications: Outpatient Encounter Medications as of 10/03/2020  Medication Sig   aspirin EC 81 MG tablet Take 81 mg by mouth daily.   BLACK COHOSH EXTRACT PO Take by mouth daily.   glimepiride (AMARYL) 4 MG tablet Take 1 tablet by mouth twice daily   glucose blood (RELION GLUCOSE TEST STRIPS) test strip Use as instructed   HYDROcodone-acetaminophen (NORCO/VICODIN) 5-325 MG tablet Take 1 tablet by mouth every 6 (six) hours as needed for moderate pain.   ibuprofen (ADVIL) 800 MG tablet Take 1 tablet (800 mg total) by mouth every 8 (eight) hours as needed.   metFORMIN (GLUCOPHAGE) 1000 MG tablet Take 1 tablet (1,000 mg total) by mouth 2 (two) times daily with a meal.   Multiple Vitamin (MULTIVITAMIN) tablet Take 1 tablet by mouth daily.   NOVOLIN 70/30 RELION (70-30) 100 UNIT/ML injection INJECT 45 UNITS SUBCUTANEOUSLY BEFORE BREAKFAST AND 50 UNITS BEFORE SUPPER   pioglitazone (ACTOS) 30 MG tablet Take 1 tablet by mouth once daily   pravastatin (PRAVACHOL) 40 MG tablet TAKE 1 TABLET BY MOUTH AT BEDTIME   ramipril (ALTACE) 2.5 MG capsule Take 1 capsule by mouth once daily   spironolactone (ALDACTONE) 25 MG tablet Take 1 tablet by mouth once daily   traMADol (ULTRAM) 50 MG tablet Take 1 - 2 tablets every 6 hours as needed for pain   No facility-administered encounter medications on file as of 10/03/2020.    Current Diagnosis: Patient Active Problem List   Diagnosis Date Noted   Diabetes mellitus ([place]) 04/17/2020   Hyperlipidemia    Hypertension    GERD (gastroesophageal reflux disease)    Acute left-sided low back pain without sciatica  01/10/2020   Back pain of lumbar region with sciatica 01/07/2020   Other acute recurrent sinusitis 10/29/2019   Cough 10/29/2019   Varicose veins of lower extremities with other complications 58/52/7782   Varicose veins of lower extremities with inflammation 06/14/2011    Have you seen any other providers since your last visit? Yes, 08/09/20, went to podiatry for foot injury, no medication changes.  Any changes in your medications or health? Yes, patient stated it is harder for her to walk around due to stiffness in her joints and body pain.  Any side effects from any medications? Patient states she has no issues with side effects.  Do you have an symptoms or problems not managed by your medications? Yes, patient stated she has decreased mobility.  Any concerns about your health right now? Yes, patient stated her joint stiffness, decreased mobility.  Has your provider asked that you check blood pressure, blood sugar, or follow special diet at home? Patient stated she does not regularly check her blood sugar or blood pressure, she is supposed to follow a diabetic diet, but doesn't really follow it like she should.  Do you get any type of exercise on a regular basis? No, patient stated she gets no exercise, her daughter has to do a lot for her.  Can you think of a goal you would like to reach for your health? Patient stated she would like more mobility.    Do you have any problems getting your medications?  Yes, patient stated that once she enters the donut hole in August she can't afford her Janumet so she stops taking it.    Is there anything that you would like to discuss during the appointment?Yes, the patient would like to discuss her getting a scooter.  Patient is aware to have medications available for her appointment.   Follow-Up:  Pharmacist Review  Donette Larry, CPP notified  Clarita Leber, [facility] Pharmacist Assistant [phone]

## 2020-10-06 ENCOUNTER — Ambulatory Visit: Payer: Medicare HMO

## 2020-10-06 ENCOUNTER — Other Ambulatory Visit: Payer: Self-pay

## 2020-10-06 DIAGNOSIS — Z794 Long term (current) use of insulin: Secondary | ICD-10-CM

## 2020-10-06 DIAGNOSIS — E11628 Type 2 diabetes mellitus with other skin complications: Secondary | ICD-10-CM

## 2020-10-06 DIAGNOSIS — E782 Mixed hyperlipidemia: Secondary | ICD-10-CM

## 2020-10-06 DIAGNOSIS — I10 Essential (primary) hypertension: Secondary | ICD-10-CM

## 2020-10-06 NOTE — Patient Instructions (Signed)
Visit Information  Thank you for your time discussing your medications. I look forward to working with you to achieve your health care goals. Below is a summary of what we talked about during our visit.   Goals Addressed            This Visit's Progress   . Pharmacy Care Plan       CARE PLAN ENTRY (see longitudinal plan of care for additional care plan information)  Current Barriers:  . Chronic Disease Management support, education, and care coordination needs related to Hypertension, Hyperlipidemia, and Diabetes   Hypertension BP Readings from Last 3 Encounters:  07/18/20 118/60  07/06/20 128/68  04/15/20 (!) 132/82   . Pharmacist Clinical Goal(s): o Over the next 90 days, patient will work with PharmD and providers to maintain BP goal <130/80 . Current regimen:   Ramipril 2.5 mg daily   Spironolactone 25 mg daily . Interventions: o Patient states that blood pressure stays well controlled.  o Patient missed 1 week of spironolactone and denies any swelling. Would like to consider discontinuing if patient agrees.  . Patient self care activities - Over the next 90 days, patient will: o Ensure daily salt intake < 2300 mg/day  Hyperlipidemia Lab Results  Component Value Date/Time   LDLCALC 94 07/18/2020 10:51 AM   . Pharmacist Clinical Goal(s): o Over the next 90 days, patient will work with PharmD and providers to maintain LDL goal < 100 . Current regimen:  . Pravastatin 40 mg daily at bedtime . Interventions: o Discussed benefit of weight loss and exercise on controlling cholesterol and reducing cardiovascular effects.  . Patient self care activities - Over the next 90 days, patient will: o Continue current medication as prescribed.   Diabetes Lab Results  Component Value Date/Time   HGBA1C 6.7 (H) 07/18/2020 10:51 AM   HGBA1C 6.7 (H) 04/15/2020 11:03 AM   . Pharmacist Clinical Goal(s): o Over the next 90 days, patient will work with PharmD and providers to  maintain A1c goal <7% . Current regimen:   Glimepiride 4 mg bid  Metformin 1000 mg bid  Novoloin 70/30 45 units before breakfast and 50 units before supper  Pioglitazone 30 mg 1 tablet by mouth daily   Reli-on blood glucose test  . Interventions: o Reviewed blood sugar regimen and options.  o Pharmacist will coordinate patient assistance for regimen.  o Recommend considering Tresiba 30 units daily and Ozempic 0.25 mg weekly (increasing every 4 weeks to ultimate goal of 1 mg weekly).  o Recommended patient begin testing blood sugar once daily.  . Patient self care activities - Over the next 90 days, patient will: o Check blood sugar once daily, document, and provide at future appointments o Contact provider with any episodes of hypoglycemia  Medication management . Pharmacist Clinical Goal(s): o Over the next 90 days, patient will work with PharmD and providers to achieve optimal medication adherence . Current pharmacy: [facility] . Interventions o Comprehensive medication review performed. o Utilize [facility] for medication synchronization, packaging and delivery . Patient self care activities - Over the next 90 days, patient will: o Focus on medication adherence by pharmacy delivery and coordination o Take medications as prescribed o Report any questions or concerns to PharmD and/or provider(s)  Initial goal documentation        Ms. Allan was given information about Chronic Care Management services today including:  1. CCM service includes personalized support from designated clinical staff supervised by her physician, including  individualized plan of care and coordination with other care providers 2. 24/7 contact phone numbers for assistance for urgent and routine care needs. 3. Standard insurance, coinsurance, copays and deductibles apply for chronic care management only during months in which we provide at least 20 minutes of these services. Most  insurances cover these services at 100%, however patients may be responsible for any copay, coinsurance and/or deductible if applicable. This service may help you avoid the need for more expensive face-to-face services. 4. Only one practitioner may furnish and bill the service in a calendar month. 5. The patient may stop CCM services at any time (effective at the end of the month) by phone call to the office staff.  Patient agreed to services and verbal consent obtained.   The patient verbalized understanding of instructions, educational materials, and care plan provided today and agreed to receive a mailed copy of patient instructions, educational materials, and care plan.  Telephone follow up appointment with pharmacy team member scheduled for: Feb 2022  Juliane Lack, PharmD Clinical Pharmacist [name] [phone] (office) [phone] (mobile)  Diabetes Mellitus and Nutrition, Adult When you have diabetes, or diabetes mellitus, it is very important to have healthy eating habits because your blood sugar (glucose) levels are greatly affected by what you eat and drink. Eating healthy foods in the right amounts, at about the same times every day, can help you:  Control your blood glucose.  Lower your risk of heart disease.  Improve your blood pressure.  Reach or maintain a healthy weight. What can affect my meal plan? Every person with diabetes is different, and each person has different needs for a meal plan. Your health care provider may recommend that you work with a dietitian to make a meal plan that is best for you. Your meal plan may vary depending on factors such as:  The calories you need.  The medicines you take.  Your weight.  Your blood glucose, blood pressure, and cholesterol levels.  Your activity level.  Other health conditions you have, such as heart or kidney disease. How do carbohydrates affect me? Carbohydrates, also called carbs, affect your  blood glucose level more than any other type of food. Eating carbs naturally raises the amount of glucose in your blood. Carb counting is a method for keeping track of how many carbs you eat. Counting carbs is important to keep your blood glucose at a healthy level, especially if you use insulin or take certain oral diabetes medicines. It is important to know how many carbs you can safely have in each meal. This is different for every person. Your dietitian can help you calculate how many carbs you should have at each meal and for each snack. How does alcohol affect me? Alcohol can cause a sudden decrease in blood glucose (hypoglycemia), especially if you use insulin or take certain oral diabetes medicines. Hypoglycemia can be a life-threatening condition. Symptoms of hypoglycemia, such as sleepiness, dizziness, and confusion, are similar to symptoms of having too much alcohol.  Do not drink alcohol if: ? Your health care provider tells you not to drink. ? You are pregnant, may be pregnant, or are planning to become pregnant.  If you drink alcohol: ? Do not drink on an empty stomach. ? Limit how much you use to:  0-1 drink a day for women.  0-2 drinks a day for men. ? Be aware of how much alcohol is in your drink. In the [place] one drink equals one 12  oz bottle of beer (355 mL), one 5 oz glass of wine (148 mL), or one 1 oz glass of hard liquor (44 mL). ? Keep yourself hydrated with water, diet soda, or unsweetened iced tea.  Keep in mind that regular soda, juice, and other mixers may contain a lot of sugar and must be counted as carbs. What are tips for following this plan? Reading food labels  Start by checking the serving size on the "Nutrition Facts" label of packaged foods and drinks. The amount of calories, carbs, fats, and other nutrients listed on the label is based on one serving of the item. Many items contain more than one serving per package.  Check the total grams (g) of carbs in  one serving. You can calculate the number of servings of carbs in one serving by dividing the total carbs by 15. For example, if a food has 30 g of total carbs per serving, it would be equal to 2 servings of carbs.  Check the number of grams (g) of saturated fats and trans fats in one serving. Choose foods that have a low amount or none of these fats.  Check the number of milligrams (mg) of salt (sodium) in one serving. Most people should limit total sodium intake to less than 2,300 mg per day.  Always check the nutrition information of foods labeled as "low-fat" or "nonfat." These foods may be higher in added sugar or refined carbs and should be avoided.  Talk to your dietitian to identify your daily goals for nutrients listed on the label. Shopping  Avoid buying canned, pre-made, or processed foods. These foods tend to be high in fat, sodium, and added sugar.  Shop around the outside edge of the grocery store. This is where you will most often find fresh fruits and vegetables, bulk grains, fresh meats, and fresh dairy. Cooking  Use low-heat cooking methods, such as baking, instead of high-heat cooking methods like deep frying.  Cook using healthy oils, such as olive, canola, or sunflower oil.  Avoid cooking with butter, cream, or high-fat meats. Meal planning  Eat meals and snacks regularly, preferably at the same times every day. Avoid going long periods of time without eating.  Eat foods that are high in fiber, such as fresh fruits, vegetables, beans, and whole grains. Talk with your dietitian about how many servings of carbs you can eat at each meal.  Eat 4-6 oz (112-168 g) of lean protein each day, such as lean meat, chicken, fish, eggs, or tofu. One ounce (oz) of lean protein is equal to: ? 1 oz (28 g) of meat, chicken, or fish. ? 1 egg. ?  cup (62 g) of tofu.  Eat some foods each day that contain healthy fats, such as avocado, nuts, seeds, and fish.   What foods should I  eat? Fruits Berries. Apples. Oranges. Peaches. Apricots. Plums. Grapes. Mango. Papaya. Pomegranate. Kiwi. Cherries. Vegetables Lettuce. Spinach. Leafy greens, including kale, chard, collard greens, and mustard greens. Beets. Cauliflower. Cabbage. Broccoli. Carrots. Green beans. Tomatoes. Peppers. Onions. Cucumbers. Brussels sprouts. Grains Whole grains, such as whole-wheat or whole-grain bread, crackers, tortillas, cereal, and pasta. Unsweetened oatmeal. Quinoa. Shamal Stracener or wild rice. Meats and other proteins Seafood. Poultry without skin. Lean cuts of poultry and beef. Tofu. Nuts. Seeds. Dairy Low-fat or fat-free dairy products such as milk, yogurt, and cheese. The items listed above may not be a complete list of foods and beverages you can eat. Contact a dietitian for more information. What foods  should I avoid? Fruits Fruits canned with syrup. Vegetables Canned vegetables. Frozen vegetables with butter or cream sauce. Grains Refined white flour and flour products such as bread, pasta, snack foods, and cereals. Avoid all processed foods. Meats and other proteins Fatty cuts of meat. Poultry with skin. Breaded or fried meats. Processed meat. Avoid saturated fats. Dairy Full-fat yogurt, cheese, or milk. Beverages Sweetened drinks, such as soda or iced tea. The items listed above may not be a complete list of foods and beverages you should avoid. Contact a dietitian for more information. Questions to ask a health care provider  Do I need to meet with a diabetes educator?  Do I need to meet with a dietitian?  What number can I call if I have questions?  When are the best times to check my blood glucose? Where to find more information:  American Diabetes Association: diabetes.org  Academy of Nutrition and Dietetics: www.eatright.[employer] of Diabetes and Digestive and Kidney Diseases: [employer]  Association of Diabetes Care and Education Specialists:  www.diabeteseducator.org Summary  It is important to have healthy eating habits because your blood sugar (glucose) levels are greatly affected by what you eat and drink.  A healthy meal plan will help you control your blood glucose and maintain a healthy lifestyle.  Your health care provider may recommend that you work with a dietitian to make a meal plan that is best for you.  Keep in mind that carbohydrates (carbs) and alcohol have immediate effects on your blood glucose levels. It is important to count carbs and to use alcohol carefully. This information is not intended to replace advice given to you by your health care provider. Make sure you discuss any questions you have with your health care provider. Document Revised: 08/11/2019 Document Reviewed: 08/11/2019 Elsevier Patient Education  2021 [employer].

## 2020-10-07 ENCOUNTER — Telehealth: Payer: Self-pay

## 2020-10-07 NOTE — Progress Notes (Signed)
    Chronic Care Management Pharmacy Assistant   Name: Brandi Velazquez  MRN: [id number] DOB: 07/05/1961  Reason for Encounter: Onboarding form for medication coordination   PCP : [name], MD  Allergies:   Allergies  Allergen Reactions  . Bactrim [Sulfamethoxazole-Trimethoprim] Swelling  . Morphine And Related     Medications: Outpatient Encounter Medications as of 10/07/2020  Medication Sig  . BLACK COHOSH EXTRACT PO Take by mouth daily. (Patient not taking: Reported on 10/06/2020)  . glimepiride (AMARYL) 4 MG tablet Take 1 tablet by mouth twice daily (Patient taking differently: Take 8 mg by mouth daily with breakfast.)  . glucose blood (RELION GLUCOSE TEST STRIPS) test strip Use as instructed  . HYDROcodone-acetaminophen (NORCO/VICODIN) 5-325 MG tablet Take 1 tablet by mouth every 6 (six) hours as needed for moderate pain. (Patient not taking: Reported on 10/06/2020)  . ibuprofen (ADVIL) 800 MG tablet Take 1 tablet (800 mg total) by mouth every 8 (eight) hours as needed.  . metFORMIN (GLUCOPHAGE) 1000 MG tablet Take 1 tablet (1,000 mg total) by mouth 2 (two) times daily with a meal.  . Multiple Vitamin (MULTIVITAMIN) tablet Take 1 tablet by mouth daily.  Marland Kitchen NOVOLIN 70/30 RELION (70-30) 100 UNIT/ML injection INJECT 45 UNITS SUBCUTANEOUSLY BEFORE BREAKFAST AND 50 UNITS BEFORE SUPPER (Patient taking differently: Inject 40 units subcutaneously before breakfast and 50 units before supper)  . pioglitazone (ACTOS) 30 MG tablet Take 1 tablet by mouth once daily  . pravastatin (PRAVACHOL) 40 MG tablet TAKE 1 TABLET BY MOUTH AT BEDTIME  . ramipril (ALTACE) 2.5 MG capsule Take 1 capsule by mouth once daily  . spironolactone (ALDACTONE) 25 MG tablet Take 1 tablet by mouth once daily  . traMADol (ULTRAM) 50 MG tablet Take 1 - 2 tablets every 6 hours as needed for pain   No facility-administered encounter medications on file as of 10/07/2020.    Current Diagnosis: Patient Active Problem List    Diagnosis Date Noted  . Diabetes mellitus ([place]) 04/17/2020  . Hyperlipidemia   . Hypertension   . GERD (gastroesophageal reflux disease)   . Acute left-sided low back pain without sciatica 01/10/2020  . Back pain of lumbar region with sciatica 01/07/2020  . Other acute recurrent sinusitis 10/29/2019  . Cough 10/29/2019  . Varicose veins of lower extremities with other complications 76/73/4193  . Varicose veins of lower extremities with inflammation 06/14/2011   Contacted patient to go over medications and amounts on hand to coordinate delivery with [facility].  Patient has chosen [facility] in packs  Spoke with [place] to get profile sent to [place], she only has 2 medications with refills left on them.  They are going to send them over.   Follow-Up:  Coordination of Enhanced Pharmacy Services and Pharmacist Review  Donette Larry, CPP notified  Clarita Leber, [place] Pharmacist Assistant [phone]

## 2020-10-10 ENCOUNTER — Other Ambulatory Visit: Payer: Self-pay

## 2020-10-10 MED ORDER — PIOGLITAZONE HCL 30 MG PO TABS
30.0000 mg | ORAL_TABLET | Freq: Every day | ORAL | 1 refills | Status: DC
Start: 2020-10-10 — End: 2020-10-13

## 2020-10-10 MED ORDER — SPIRONOLACTONE 25 MG PO TABS
25.0000 mg | ORAL_TABLET | Freq: Every day | ORAL | 1 refills | Status: DC
Start: 2020-10-10 — End: 2020-10-13

## 2020-10-10 MED ORDER — RAMIPRIL 2.5 MG PO CAPS
2.5000 mg | ORAL_CAPSULE | Freq: Every day | ORAL | 1 refills | Status: DC
Start: 2020-10-10 — End: 2020-10-13

## 2020-10-10 MED ORDER — METFORMIN HCL 1000 MG PO TABS
1000.0000 mg | ORAL_TABLET | Freq: Two times a day (BID) | ORAL | 1 refills | Status: DC
Start: 2020-10-10 — End: 2020-10-13

## 2020-10-10 MED ORDER — GLIMEPIRIDE 4 MG PO TABS
8.0000 mg | ORAL_TABLET | Freq: Every day | ORAL | 1 refills | Status: DC
Start: 2020-10-10 — End: 2020-10-13

## 2020-10-13 ENCOUNTER — Other Ambulatory Visit: Payer: Self-pay

## 2020-10-13 ENCOUNTER — Telehealth (INDEPENDENT_AMBULATORY_CARE_PROVIDER_SITE_OTHER): Payer: Medicare HMO | Admitting: Family Medicine

## 2020-10-13 ENCOUNTER — Encounter: Payer: Self-pay | Admitting: Family Medicine

## 2020-10-13 VITALS — BP 126/68 | HR 78 | Temp 97.8°F | Ht 63.0 in

## 2020-10-13 DIAGNOSIS — J189 Pneumonia, unspecified organism: Secondary | ICD-10-CM

## 2020-10-13 DIAGNOSIS — J208 Acute bronchitis due to other specified organisms: Secondary | ICD-10-CM | POA: Diagnosis not present

## 2020-10-13 LAB — POC COVID19 BINAXNOW: SARS Coronavirus 2 Ag: NEGATIVE

## 2020-10-13 MED ORDER — CEFTRIAXONE SODIUM 1 G IJ SOLR
1.0000 g | Freq: Once | INTRAMUSCULAR | Status: AC
  Administered 2020-10-13: 1 g via INTRAMUSCULAR

## 2020-10-13 MED ORDER — TRIAMCINOLONE ACETONIDE 40 MG/ML IJ SUSP
80.0000 mg | Freq: Once | INTRAMUSCULAR | Status: AC
  Administered 2020-10-13: 80 mg via INTRAMUSCULAR

## 2020-10-13 MED ORDER — SPIRONOLACTONE 25 MG PO TABS: 25.0000 mg | ORAL_TABLET | Freq: Every day | ORAL | 1 refills | Status: DC

## 2020-10-13 MED ORDER — PIOGLITAZONE HCL 30 MG PO TABS: 30.0000 mg | ORAL_TABLET | Freq: Every day | ORAL | 1 refills | Status: DC

## 2020-10-13 MED ORDER — CEFDINIR 300 MG PO CAPS: 300.0000 mg | ORAL_CAPSULE | Freq: Two times a day (BID) | ORAL | 0 refills | Status: DC

## 2020-10-13 MED ORDER — METFORMIN HCL 1000 MG PO TABS: 1000.0000 mg | ORAL_TABLET | Freq: Two times a day (BID) | ORAL | 1 refills | Status: DC

## 2020-10-13 MED ORDER — PREDNISONE 10 MG PO TABS: 10.0000 mg | ORAL_TABLET | Freq: Two times a day (BID) | ORAL | 0 refills | Status: DC

## 2020-10-13 MED ORDER — ALBUTEROL SULFATE HFA 108 (90 BASE) MCG/ACT IN AERS: 2.0000 | INHALATION_SPRAY | Freq: Four times a day (QID) | RESPIRATORY_TRACT | 2 refills | Status: DC | PRN

## 2020-10-13 MED ORDER — RAMIPRIL 2.5 MG PO CAPS: 2.5000 mg | ORAL_CAPSULE | Freq: Every day | ORAL | 1 refills | Status: DC

## 2020-10-13 MED ORDER — GLIMEPIRIDE 4 MG PO TABS
8.0000 mg | ORAL_TABLET | Freq: Every day | ORAL | 1 refills | Status: DC
Start: 2020-10-13 — End: 2021-03-27

## 2020-10-13 NOTE — Patient Instructions (Addendum)
Shot given in the office: Kenalog 80 mg  Rocephin 1 gm.   Prescriptions: Prednisone 10 mg one twice a day for 10 days.  Cefdinir 300 mg 2 daily x 10 days.  Albuterol inhaler 2 puffs four times a day for 2 days, then 2 puffs four times a day as needed for wheezing.   Please continue to quarantine until Send Out covid 19 test is back.  Drink plenty of fluid.

## 2020-10-13 NOTE — Progress Notes (Signed)
Date:  10/17/2020   ID:  Brandi Velazquez, DOB 09-29-1960, MRN [id number]  PCP:  [name], MD   Evaluation Performed: acute Chief Complaint:  cough  History of Present Illness:    Brandi Velazquez is a 60 y.o. female with coughing. No fever, chills, sweats, earaches, sore throat.   Her daughter just started having symptoms yesterday. Pt has had a sharp pain in the middle of her back x 3 days. Worse 3 days ago. Took ibuprofen and it helped some.   The patient does have symptoms concerning for COVID-19 infection (fever, chills, cough, or new shortness of breath).    Past Medical History:  Diagnosis Date  . Ankle pain   . Chronic pain    back  . COPD (chronic obstructive pulmonary disease) ([place])   . Depression   . Diabetes mellitus   . GERD (gastroesophageal reflux disease)   . Hyperlipidemia   . Hypertension   . Low back pain   . Obstructive sleep apnea   . Pneumonia 2010  . Post-menopausal atrophic vaginitis   . Superficial phlebitis     Past Surgical History:  Procedure Laterality Date  . BACK SURGERY  1990  . CESAREAN SECTION    . CYST REMOVAL HAND     inner thigh  . SPINE SURGERY     lumbar laminectomy and diskectomy    Family History  Problem Relation Age of Onset  . Diabetes Mother   . Heart disease Mother   . Cancer Father   . Stroke Sister     Social History   Socioeconomic History  . Marital status: Single    Spouse name: Not on file  . Number of children: Not on file  . Years of education: Not on file  . Highest education level: Not on file  Occupational History  . Not on file  Tobacco Use  . Smoking status: Former Smoker    Quit date: 03/20/2009    Years since quitting: 11.5  . Smokeless tobacco: Never Used  Substance and Sexual Activity  . Alcohol use: No  . Drug use: No  . Sexual activity: Not on file  Other Topics Concern  . Not on file  Social History Narrative  . Not on file   Social Determinants of Health   Financial  Resource Strain: Not on file  Food Insecurity: Unknown  . Worried About Charity fundraiser in the Last Year: Not on file  . Ran Out of Food in the Last Year: Never true  Transportation Needs: No Transportation Needs  . Lack of Transportation (Medical): No  . Lack of Transportation (Non-Medical): No  Physical Activity: Not on file  Stress: Not on file  Social Connections: Not on file  Intimate Partner Violence: Not on file    Outpatient Medications Prior to Visit  Medication Sig Dispense Refill  . BLACK COHOSH EXTRACT PO Take by mouth daily. (Patient not taking: Reported on 10/06/2020)    . glimepiride (AMARYL) 4 MG tablet Take 2 tablets (8 mg total) by mouth daily with breakfast. 90 tablet 1  . glucose blood (RELION GLUCOSE TEST STRIPS) test strip Use as instructed 100 each 2  . HYDROcodone-acetaminophen (NORCO/VICODIN) 5-325 MG tablet Take 1 tablet by mouth every 6 (six) hours as needed for moderate pain. (Patient not taking: Reported on 10/06/2020) 30 tablet 0  . ibuprofen (ADVIL) 800 MG tablet Take 1 tablet (800 mg total) by mouth every 8 (eight) hours as needed. 30 tablet  0  . metFORMIN (GLUCOPHAGE) 1000 MG tablet Take 1 tablet (1,000 mg total) by mouth 2 (two) times daily with a meal. 180 tablet 1  . Multiple Vitamin (MULTIVITAMIN) tablet Take 1 tablet by mouth daily.    Marland Kitchen NOVOLIN 70/30 RELION (70-30) 100 UNIT/ML injection INJECT 45 UNITS SUBCUTANEOUSLY BEFORE BREAKFAST AND 50 UNITS BEFORE SUPPER (Patient taking differently: Inject 40 units subcutaneously before breakfast and 50 units before supper) 30 mL 2  . pioglitazone (ACTOS) 30 MG tablet Take 1 tablet (30 mg total) by mouth daily. 90 tablet 1  . pravastatin (PRAVACHOL) 40 MG tablet TAKE 1 TABLET BY MOUTH AT BEDTIME 90 tablet 0  . ramipril (ALTACE) 2.5 MG capsule Take 1 capsule (2.5 mg total) by mouth daily. 90 capsule 1  . spironolactone (ALDACTONE) 25 MG tablet Take 1 tablet (25 mg total) by mouth daily. 90 tablet 1  . traMADol  (ULTRAM) 50 MG tablet Take 1 - 2 tablets every 6 hours as needed for pain     No facility-administered medications prior to visit.    Allergies:   Bactrim [sulfamethoxazole-trimethoprim] and Morphine and related   Social History   Tobacco Use  . Smoking status: Former Smoker    Quit date: 03/20/2009    Years since quitting: 11.5  . Smokeless tobacco: Never Used  Substance Use Topics  . Alcohol use: No  . Drug use: No     Review of Systems  Constitutional: Negative for chills, fever and malaise/fatigue.  HENT: Negative for ear pain, sinus pain and sore throat.   Respiratory: Positive for cough. Negative for shortness of breath.   Cardiovascular: Negative for chest pain.  Musculoskeletal: Negative for myalgias.  Neurological: Negative for headaches.     Labs/Other Tests and Data Reviewed:    Recent Labs: 07/18/2020: ALT 17; BUN 18; Creatinine, Ser 0.62; Hemoglobin 13.8; Platelets 230; Potassium 4.6; Sodium 143   Recent Lipid Panel Lab Results  Component Value Date/Time   CHOL 163 07/18/2020 10:51 AM   TRIG 67 07/18/2020 10:51 AM   HDL 56 07/18/2020 10:51 AM   CHOLHDL 2.9 07/18/2020 10:51 AM   LDLCALC 94 07/18/2020 10:51 AM    Wt Readings from Last 3 Encounters:  07/18/20 265 lb (120.2 kg)  07/06/20 265 lb (120.2 kg)  04/15/20 (!) 261 lb (118.4 kg)     Objective:    Vital Signs:  BP 126/68   Pulse 78   Temp 97.8 F (36.6 C) (Temporal)   Ht 5\' 3"  (1.6 m)   SpO2 98%   BMI 46.94 kg/m    Physical Exam Vitals reviewed.  Constitutional:      Appearance: Normal appearance. She is obese.  Cardiovascular:     Rate and Rhythm: Normal rate and regular rhythm.     Heart sounds: Normal heart sounds.  Pulmonary:     Effort: Pulmonary effort is normal.     Breath sounds: Wheezing and rhonchi present.  Neurological:     Mental Status: She is alert.      ASSESSMENT & PLAN:   1. Acute bronchitis due to other specified organisms Start on prednisone. -  triamcinolone acetonide (KENALOG-40) injection 80 mg - predniSONE (DELTASONE) 10 MG tablet; Take 1 tablet (10 mg total) by mouth 2 (two) times daily with a meal.  Dispense: 20 tablet; Refill: 0 - albuterol (VENTOLIN HFA) 108 (90 Base) MCG/ACT inhaler; Inhale 2 puffs into the lungs every 6 (six) hours as needed for wheezing or shortness of breath.  Dispense: 8  g; Refill: 2 - POC COVID-19 - Novel Coronavirus, NAA ([facility])  2. Pneumonia of both lungs due to infectious organism, unspecified part of lung - cefTRIAXone (ROCEPHIN) injection 1 g - cefdinir (OMNICEF) 300 MG capsule; Take 1 capsule (300 mg total) by mouth 2 (two) times daily.  Dispense: 20 capsule; Refill: 0   PLAN: Shots given in the office: Kenalog 80 mg,Rocephin 1 gm.  Prescriptions: Prednisone 10 mg one twice a day for 10 days.  Cefdinir 300 mg 2 daily x 10 days.  Albuterol inhaler 2 puffs four times a day for 2 days, then 2 puffs four times a day as needed for wheezing.  Please continue to quarantine until Send Out covid 19 test is back.  Drink plenty of fluid.   Orders Placed This Encounter  Procedures  . Novel Coronavirus, NAA ([facility])  . POC COVID-19     Meds ordered this encounter  Medications  . cefTRIAXone (ROCEPHIN) injection 1 g  . triamcinolone acetonide (KENALOG-40) injection 80 mg  . predniSONE (DELTASONE) 10 MG tablet    Sig: Take 1 tablet (10 mg total) by mouth 2 (two) times daily with a meal.    Dispense:  20 tablet    Refill:  0  . albuterol (VENTOLIN HFA) 108 (90 Base) MCG/ACT inhaler    Sig: Inhale 2 puffs into the lungs every 6 (six) hours as needed for wheezing or shortness of breath.    Dispense:  8 g    Refill:  2  . cefdinir (OMNICEF) 300 MG capsule    Sig: Take 1 capsule (300 mg total) by mouth 2 (two) times daily.    Dispense:  20 capsule    Refill:  0   Began as a virtual visit, but converted to in person in the parking lot.   Follow Up:  In Person prn  Signed,  [name], MD   10/17/2020 12:09 AM    [place]

## 2020-10-14 ENCOUNTER — Telehealth: Payer: Self-pay

## 2020-10-14 LAB — NOVEL CORONAVIRUS, NAA: SARS-CoV-2, NAA: NOT DETECTED

## 2020-10-14 NOTE — Progress Notes (Signed)
Chronic Care Management Pharmacy Assistant   Name: MAAHI LANNAN  MRN: [id number] DOB: March 25, 1961  Reason for Encounter: Medication Review for Upstream delivery   PCP : [name], MD  Allergies:   Allergies  Allergen Reactions  . Bactrim [Sulfamethoxazole-Trimethoprim] Swelling  . Morphine And Related     Medications: Outpatient Encounter Medications as of 10/14/2020  Medication Sig  . albuterol (VENTOLIN HFA) 108 (90 Base) MCG/ACT inhaler Inhale 2 puffs into the lungs every 6 (six) hours as needed for wheezing or shortness of breath.  Marland Kitchen BLACK COHOSH EXTRACT PO Take by mouth daily. (Patient not taking: Reported on 10/06/2020)  . cefdinir (OMNICEF) 300 MG capsule Take 1 capsule (300 mg total) by mouth 2 (two) times daily.  Marland Kitchen glimepiride (AMARYL) 4 MG tablet Take 2 tablets (8 mg total) by mouth daily with breakfast.  . glucose blood (RELION GLUCOSE TEST STRIPS) test strip Use as instructed  . HYDROcodone-acetaminophen (NORCO/VICODIN) 5-325 MG tablet Take 1 tablet by mouth every 6 (six) hours as needed for moderate pain. (Patient not taking: Reported on 10/06/2020)  . ibuprofen (ADVIL) 800 MG tablet Take 1 tablet (800 mg total) by mouth every 8 (eight) hours as needed.  . metFORMIN (GLUCOPHAGE) 1000 MG tablet Take 1 tablet (1,000 mg total) by mouth 2 (two) times daily with a meal.  . Multiple Vitamin (MULTIVITAMIN) tablet Take 1 tablet by mouth daily.  Marland Kitchen NOVOLIN 70/30 RELION (70-30) 100 UNIT/ML injection INJECT 45 UNITS SUBCUTANEOUSLY BEFORE BREAKFAST AND 50 UNITS BEFORE SUPPER (Patient taking differently: Inject 40 units subcutaneously before breakfast and 50 units before supper)  . pioglitazone (ACTOS) 30 MG tablet Take 1 tablet (30 mg total) by mouth daily.  . pravastatin (PRAVACHOL) 40 MG tablet TAKE 1 TABLET BY MOUTH AT BEDTIME  . predniSONE (DELTASONE) 10 MG tablet Take 1 tablet (10 mg total) by mouth 2 (two) times daily with a meal.  . ramipril (ALTACE) 2.5 MG capsule Take 1  capsule (2.5 mg total) by mouth daily.  Marland Kitchen spironolactone (ALDACTONE) 25 MG tablet Take 1 tablet (25 mg total) by mouth daily.  . traMADol (ULTRAM) 50 MG tablet Take 1 - 2 tablets every 6 hours as needed for pain   No facility-administered encounter medications on file as of 10/14/2020.    Current Diagnosis: Patient Active Problem List   Diagnosis Date Noted  . Diabetes mellitus ([place]) 04/17/2020  . Hyperlipidemia   . Hypertension   . GERD (gastroesophageal reflux disease)   . Acute left-sided low back pain without sciatica 01/10/2020  . Back pain of lumbar region with sciatica 01/07/2020  . Other acute recurrent sinusitis 10/29/2019  . Cough 10/29/2019  . Varicose veins of lower extremities with other complications 26/37/8588  . Varicose veins of lower extremities with inflammation 06/14/2011   Spoke with patient she has decided to continue to get her Relion test strips at [facility] due to [facility] being more expensive.  She also picked up new 90 day supplies of the following medications yesterday along with her needles. glimepiride (AMARYL) 4 MG  pioglitazone (ACTOS) 30 MG  ramipril (ALTACE) 2.5 MG   I have prepared a new onboarding form for the pharmacy to reflect the new dates and anticipated med sync date.  She also inquired if Donette Larry, CPP or Dr. Tobie Poet had said anything about getting her a scooter to increase her mobility and independence.     Follow-Up:  Comptroller and Pharmacist Review  Donette Larry, CPP notified  Clarita Leber, [place] Pharmacist Assistant [phone]

## 2020-10-17 ENCOUNTER — Encounter: Payer: Self-pay | Admitting: Family Medicine

## 2020-10-17 ENCOUNTER — Other Ambulatory Visit: Payer: Self-pay

## 2020-10-17 DIAGNOSIS — E782 Mixed hyperlipidemia: Secondary | ICD-10-CM

## 2020-10-17 MED ORDER — PRAVASTATIN SODIUM 40 MG PO TABS: 40.0000 mg | ORAL_TABLET | Freq: Every day | ORAL | 1 refills | Status: DC

## 2020-10-25 NOTE — Progress Notes (Signed)
Subjective:  Patient ID: Brandi Velazquez, female    DOB: Nov 25, 1960  Age: 60 y.o. MRN: [id number]  Chief Complaint  Patient presents with  . Diabetes  . Hyperlipidemia    HPI DM-Novolin 70/30 40 units in a.m. and 50 units before supper, Metformin 1000 mg twice daily, Glimepiride 4 mg two daily with breakfast, Actos 30 mg once daily.She does not check FBS daily when last checked yesterday was 86.  Discussed changing 70/30 U to tresiba 30 U at bedtime and starting ozempic. She is agreeable to this change.  Patient is working on eating healthy.  Unable to exercise secondary to her back pain. Hyperlipidemia -Pravastatin 40 mg once daily.  She tries to eat a low-fat diet. Chronic back pain. Sore. Pt is interested in PT for back and mobility. She is interested in a motorized scooter.  Pain worsens with extended walking. Pneumonia-Completed cefdinir and prednisone however is still coughing. Feels better, but not fully better.  Still coughing.  Still wheezing.  Denies fevers, chills.  Denies chest pain.  Current Outpatient Medications on File Prior to Visit  Medication Sig Dispense Refill  . albuterol (VENTOLIN HFA) 108 (90 Base) MCG/ACT inhaler Inhale 2 puffs into the lungs every 6 (six) hours as needed for wheezing or shortness of breath. 8 g 2  . BLACK COHOSH EXTRACT PO Take by mouth daily. (Patient not taking: Reported on 10/06/2020)    . glimepiride (AMARYL) 4 MG tablet Take 2 tablets (8 mg total) by mouth daily with breakfast. 90 tablet 1  . glucose blood (RELION GLUCOSE TEST STRIPS) test strip Use as instructed 100 each 2  . HYDROcodone-acetaminophen (NORCO/VICODIN) 5-325 MG tablet Take 1 tablet by mouth every 6 (six) hours as needed for moderate pain. (Patient not taking: Reported on 10/06/2020) 30 tablet 0  . ibuprofen (ADVIL) 800 MG tablet Take 1 tablet (800 mg total) by mouth every 8 (eight) hours as needed. 30 tablet 0  . metFORMIN (GLUCOPHAGE) 1000 MG tablet Take 1 tablet (1,000 mg total)  by mouth 2 (two) times daily with a meal. 180 tablet 1  . Multiple Vitamin (MULTIVITAMIN) tablet Take 1 tablet by mouth daily.    Marland Kitchen NOVOLIN 70/30 RELION (70-30) 100 UNIT/ML injection INJECT 45 UNITS SUBCUTANEOUSLY BEFORE BREAKFAST AND 50 UNITS BEFORE SUPPER (Patient taking differently: Inject 40 units subcutaneously before breakfast and 50 units before supper) 30 mL 2  . pioglitazone (ACTOS) 30 MG tablet Take 1 tablet (30 mg total) by mouth daily. 90 tablet 1  . pravastatin (PRAVACHOL) 40 MG tablet Take 1 tablet (40 mg total) by mouth at bedtime. 90 tablet 1  . ramipril (ALTACE) 2.5 MG capsule Take 1 capsule (2.5 mg total) by mouth daily. 90 capsule 1  . spironolactone (ALDACTONE) 25 MG tablet Take 1 tablet (25 mg total) by mouth daily. 90 tablet 1  . traMADol (ULTRAM) 50 MG tablet Take 1 - 2 tablets every 6 hours as needed for pain     No current facility-administered medications on file prior to visit.   Past Medical History:  Diagnosis Date  . Ankle pain   . Chronic pain    back  . COPD (chronic obstructive pulmonary disease) ([place])   . Depression   . Diabetes mellitus   . GERD (gastroesophageal reflux disease)   . Hyperlipidemia   . Hypertension   . Low back pain   . Obstructive sleep apnea   . Pneumonia 2010  . Post-menopausal atrophic vaginitis   . Superficial phlebitis  Past Surgical History:  Procedure Laterality Date  . BACK SURGERY  1990  . CESAREAN SECTION    . CYST REMOVAL HAND     inner thigh  . SPINE SURGERY     lumbar laminectomy and diskectomy    Family History  Problem Relation Age of Onset  . Diabetes Mother   . Heart disease Mother   . Cancer Father   . Stroke Sister    Social History   Socioeconomic History  . Marital status: Single    Spouse name: Not on file  . Number of children: Not on file  . Years of education: Not on file  . Highest education level: Not on file  Occupational History  . Not on file  Tobacco Use  . Smoking status:  Former Smoker    Quit date: 03/20/2009    Years since quitting: 11.6  . Smokeless tobacco: Never Used  Substance and Sexual Activity  . Alcohol use: No  . Drug use: No  . Sexual activity: Not on file  Other Topics Concern  . Not on file  Social History Narrative  . Not on file   Social Determinants of Health   Financial Resource Strain: Not on file  Food Insecurity: Unknown  . Worried About Charity fundraiser in the Last Year: Not on file  . Ran Out of Food in the Last Year: Never true  Transportation Needs: No Transportation Needs  . Lack of Transportation (Medical): No  . Lack of Transportation (Non-Medical): No  Physical Activity: Not on file  Stress: Not on file  Social Connections: Not on file    Review of Systems  Constitutional: Negative for chills, fatigue and fever.  HENT: Negative for congestion, ear pain, rhinorrhea and sore throat.   Respiratory: Positive for cough and shortness of breath.   Cardiovascular: Negative for chest pain.  Gastrointestinal: Negative for abdominal pain, constipation, diarrhea, nausea and vomiting.  Genitourinary: Negative for dysuria and urgency.  Musculoskeletal: Positive for back pain. Negative for myalgias.  Neurological: Negative for dizziness, weakness, light-headedness and headaches.  Psychiatric/Behavioral: Negative for dysphoric mood. The patient is not nervous/anxious.    Objective:  BP 132/80   Pulse 89   Temp (!) 97.3 F (36.3 C)   Ht 5\' 3"  (1.6 m)   Wt 266 lb (120.7 kg)   SpO2 99%   BMI 47.12 kg/m   BP/Weight 10/27/2020 10/13/2020 69/02/2951  Systolic BP 841 324 401  Diastolic BP 80 68 60  Wt. (Lbs) 266 - 265  BMI 47.12 46.94 46.94    Physical Exam Vitals reviewed.  Constitutional:      Appearance: Normal appearance. She is normal weight.  Neck:     Vascular: No carotid bruit.  Cardiovascular:     Rate and Rhythm: Normal rate and regular rhythm.     Pulses: Normal pulses.     Heart sounds: Normal heart  sounds.  Pulmonary:     Effort: Pulmonary effort is normal. No respiratory distress.     Breath sounds: Wheezing (Diffuse expiratory) present.     Comments: Coughing with deep breaths. Abdominal:     General: Abdomen is flat. Bowel sounds are normal.     Palpations: Abdomen is soft.     Tenderness: There is no abdominal tenderness.  Neurological:     Mental Status: She is alert and oriented to person, place, and time.  Psychiatric:        Mood and Affect: Mood normal.  Behavior: Behavior normal.     Diabetic Foot Exam - Simple   Simple Foot Form Diabetic Foot exam was performed with the following findings: Yes 10/25/2020 12:06 PM  Visual Inspection No deformities, no ulcerations, no other skin breakdown bilaterally: Yes Sensation Testing See comments: Yes Pulse Check Posterior Tibialis and Dorsalis pulse intact bilaterally: Yes Comments Decreased sensation of toes.       Lab Results  Component Value Date   WBC 13.0 (H) 10/27/2020   HGB 14.9 10/27/2020   HCT 45.6 10/27/2020   PLT 252 10/27/2020   GLUCOSE 93 10/27/2020   CHOL 159 10/27/2020   TRIG 75 10/27/2020   HDL 63 10/27/2020   LDLCALC 82 10/27/2020   ALT 20 10/27/2020   AST 14 10/27/2020   NA 141 10/27/2020   K 4.6 10/27/2020   CL 104 10/27/2020   CREATININE 0.72 10/27/2020   BUN 16 10/27/2020   CO2 23 10/27/2020   HGBA1C 8.0 (H) 10/27/2020   MICROALBUR 30 07/24/2020      Assessment & Plan:   1. Essential hypertension Well-controlled.  Takes only ramipril 2.5 mg daily and spironolactone 25 mg daily. - Comprehensive metabolic panel  2. Mixed hyperlipidemia Well-controlled.  Continue pravastatin. Eat healthy and exercise. Recommend weight loss. - Lipid panel  3. Diabetic polyneuropathy associated with type 2 diabetes mellitus ([place]) Strongly recommended to check sugars daily. Discontinue 70/30 insulin.  Start on Tresiba 30 mg daily. Start on Ozempic 0.25 mg once weekly and then increase to  0.5 mg weekly in 1 month. Continue actos, metformin.  If sugars start to drop below 80, hold glimeperide.  - CBC with Differential/Platelet - Hemoglobin A1c  4. Acute cough - DG Chest 2 View  5. Chronic midline low back pain without sciatica Continue current medications. Refer for physical therapy. Refer for scooter evaluation.  6. Morbid obesity with BMI of 45.0-49.9, adult (HCC) Low-fat, low sugar diet.  Recommend exercise as tolerated.  7. COPD exacerbation Start on breztri 2 puffs twice a day. Samples given.  Get chest xray.  Use albuterol 2 puffs four times a day.   Orders Placed This Encounter  Procedures  . HM MAMMOGRAPHY  . DG Chest 2 View  . CBC with Differential/Platelet  . Comprehensive metabolic panel  . Hemoglobin A1c  . Lipid panel  . Cardiovascular Risk Assessment  . HM COLONOSCOPY    Follow-up: Return in about 3 months (around 01/24/2021) for fasting. Has follow-up with our pharmacist in 6 weeks. An After Visit Summary was printed and given to the patient.  Brandi Brome, MD [facility] [phone]

## 2020-10-26 ENCOUNTER — Other Ambulatory Visit: Payer: Self-pay | Admitting: Family Medicine

## 2020-10-27 ENCOUNTER — Other Ambulatory Visit: Payer: Self-pay

## 2020-10-27 ENCOUNTER — Ambulatory Visit (INDEPENDENT_AMBULATORY_CARE_PROVIDER_SITE_OTHER): Payer: Medicare HMO | Admitting: Family Medicine

## 2020-10-27 ENCOUNTER — Encounter: Payer: Self-pay | Admitting: Family Medicine

## 2020-10-27 VITALS — BP 132/80 | HR 89 | Temp 97.3°F | Ht 63.0 in | Wt 266.0 lb

## 2020-10-27 DIAGNOSIS — G8929 Other chronic pain: Secondary | ICD-10-CM

## 2020-10-27 DIAGNOSIS — E11628 Type 2 diabetes mellitus with other skin complications: Secondary | ICD-10-CM | POA: Diagnosis not present

## 2020-10-27 DIAGNOSIS — Z794 Long term (current) use of insulin: Secondary | ICD-10-CM

## 2020-10-27 DIAGNOSIS — J189 Pneumonia, unspecified organism: Secondary | ICD-10-CM | POA: Diagnosis not present

## 2020-10-27 DIAGNOSIS — M545 Low back pain, unspecified: Secondary | ICD-10-CM

## 2020-10-27 DIAGNOSIS — J441 Chronic obstructive pulmonary disease with (acute) exacerbation: Secondary | ICD-10-CM

## 2020-10-27 DIAGNOSIS — I10 Essential (primary) hypertension: Secondary | ICD-10-CM

## 2020-10-27 DIAGNOSIS — R051 Acute cough: Secondary | ICD-10-CM

## 2020-10-27 DIAGNOSIS — E782 Mixed hyperlipidemia: Secondary | ICD-10-CM

## 2020-10-27 DIAGNOSIS — E1142 Type 2 diabetes mellitus with diabetic polyneuropathy: Secondary | ICD-10-CM

## 2020-10-27 DIAGNOSIS — R059 Cough, unspecified: Secondary | ICD-10-CM | POA: Diagnosis not present

## 2020-10-27 DIAGNOSIS — Z6841 Body Mass Index (BMI) 40.0 and over, adult: Secondary | ICD-10-CM | POA: Diagnosis not present

## 2020-10-27 LAB — HM MAMMOGRAPHY: HM Mammogram: NORMAL (ref 0–4)

## 2020-10-27 NOTE — Patient Instructions (Addendum)
Diabetes:  Discontinue ReliOn 70/30. Start Tresiba 30 units daily. Will send prescription for Ozempic starting at 0.25 mg once weekly and then in 1 month increase to 0.5 mg weekly. Continue actos, metformin.  If sugars start to drop below 80, hold glimeperide.   Back pain:  Refer for physical therapy.  Refer for scooter evaluation.   Pneumonia and wheezing: Start breztri 2 puffs twice a day.  Get chest xray.  Use albuterol 2 puffs four times a day.

## 2020-10-28 LAB — CBC WITH DIFFERENTIAL/PLATELET
Basophils Absolute: 0 10*3/uL (ref 0.0–0.2)
Basos: 0 %
EOS (ABSOLUTE): 0.3 10*3/uL (ref 0.0–0.4)
Eos: 2 %
Hematocrit: 45.6 % (ref 34.0–46.6)
Hemoglobin: 14.9 g/dL (ref 11.1–15.9)
Immature Grans (Abs): 0.1 10*3/uL (ref 0.0–0.1)
Immature Granulocytes: 1 %
Lymphocytes Absolute: 3.1 10*3/uL (ref 0.7–3.1)
Lymphs: 24 %
MCH: 28.5 pg (ref 26.6–33.0)
MCHC: 32.7 g/dL (ref 31.5–35.7)
MCV: 87 fL (ref 79–97)
Monocytes Absolute: 0.8 10*3/uL (ref 0.1–0.9)
Monocytes: 6 %
Neutrophils Absolute: 8.7 10*3/uL — ABNORMAL HIGH (ref 1.4–7.0)
Neutrophils: 67 %
Platelets: 252 10*3/uL (ref 150–450)
RBC: 5.23 x10E6/uL (ref 3.77–5.28)
RDW: 14 % (ref 11.7–15.4)
WBC: 13 10*3/uL — ABNORMAL HIGH (ref 3.4–10.8)

## 2020-10-28 LAB — CARDIOVASCULAR RISK ASSESSMENT

## 2020-10-28 LAB — COMPREHENSIVE METABOLIC PANEL
ALT: 20 IU/L (ref 0–32)
AST: 14 IU/L (ref 0–40)
Albumin/Globulin Ratio: 1.8 (ref 1.2–2.2)
Albumin: 4.3 g/dL (ref 3.8–4.9)
Alkaline Phosphatase: 55 IU/L (ref 44–121)
BUN/Creatinine Ratio: 22 (ref 12–28)
BUN: 16 mg/dL (ref 8–27)
Bilirubin Total: 0.3 mg/dL (ref 0.0–1.2)
CO2: 23 mmol/L (ref 20–29)
Calcium: 9.7 mg/dL (ref 8.7–10.3)
Chloride: 104 mmol/L (ref 96–106)
Creatinine, Ser: 0.72 mg/dL (ref 0.57–1.00)
GFR calc Af Amer: 105 mL/min/{1.73_m2} (ref >59)
GFR calc non Af Amer: 91 mL/min/{1.73_m2} (ref >59)
Globulin, Total: 2.4 g/dL (ref 1.5–4.5)
Glucose: 93 mg/dL (ref 65–99)
Potassium: 4.6 mmol/L (ref 3.5–5.2)
Sodium: 141 mmol/L (ref 134–144)
Total Protein: 6.7 g/dL (ref 6.0–8.5)

## 2020-10-28 LAB — LIPID PANEL
Chol/HDL Ratio: 2.5 ratio (ref 0.0–4.4)
Cholesterol, Total: 159 mg/dL (ref 100–199)
HDL: 63 mg/dL (ref >39)
LDL Chol Calc (NIH): 82 mg/dL (ref 0–99)
Triglycerides: 75 mg/dL (ref 0–149)
VLDL Cholesterol Cal: 14 mg/dL (ref 5–40)

## 2020-10-28 LAB — HEMOGLOBIN A1C
Est. average glucose Bld gHb Est-mCnc: 183 mg/dL
Hgb A1c MFr Bld: 8 % — ABNORMAL HIGH (ref 4.8–5.6)

## 2020-10-30 DIAGNOSIS — Z6841 Body Mass Index (BMI) 40.0 and over, adult: Secondary | ICD-10-CM | POA: Insufficient documentation

## 2020-10-30 MED ORDER — TRESIBA FLEXTOUCH 200 UNIT/ML ~~LOC~~ SOPN: 30.0000 [IU] | PEN_INJECTOR | Freq: Every day | SUBCUTANEOUS | 0 refills | Status: DC

## 2020-10-30 MED ORDER — OZEMPIC (0.25 OR 0.5 MG/DOSE) 2 MG/1.5ML ~~LOC~~ SOPN: 0.5000 mg | PEN_INJECTOR | SUBCUTANEOUS | 2 refills | Status: DC

## 2020-10-31 ENCOUNTER — Telehealth: Payer: Medicare HMO

## 2020-10-31 ENCOUNTER — Telehealth: Payer: Self-pay

## 2020-10-31 NOTE — Progress Notes (Signed)
    Chronic Care Management Pharmacy Assistant   Name: Brandi Velazquez  MRN: [id number] DOB: 1961-07-18  Reason for Encounter: Medication Review for PAP on Ozempic and [name]    PCP : [name], MD  Allergies:   Allergies  Allergen Reactions  . Bactrim [Sulfamethoxazole-Trimethoprim] Swelling  . Morphine And Related     Medications: Outpatient Encounter Medications as of 10/31/2020  Medication Sig  . albuterol (VENTOLIN HFA) 108 (90 Base) MCG/ACT inhaler Inhale 2 puffs into the lungs every 6 (six) hours as needed for wheezing or shortness of breath.  Marland Kitchen glimepiride (AMARYL) 4 MG tablet Take 2 tablets (8 mg total) by mouth daily with breakfast.  . glucose blood (RELION GLUCOSE TEST STRIPS) test strip Use as instructed  . ibuprofen (ADVIL) 800 MG tablet Take 1 tablet (800 mg total) by mouth every 8 (eight) hours as needed.  . insulin degludec (TRESIBA FLEXTOUCH) 200 UNIT/ML FlexTouch Pen Inject 30 Units into the skin daily.  . metFORMIN (GLUCOPHAGE) 1000 MG tablet Take 1 tablet (1,000 mg total) by mouth 2 (two) times daily with a meal.  . Multiple Vitamin (MULTIVITAMIN) tablet Take 1 tablet by mouth daily.  . pioglitazone (ACTOS) 30 MG tablet Take 1 tablet (30 mg total) by mouth daily.  . pravastatin (PRAVACHOL) 40 MG tablet Take 1 tablet (40 mg total) by mouth at bedtime.  . ramipril (ALTACE) 2.5 MG capsule Take 1 capsule (2.5 mg total) by mouth daily.  . Semaglutide,0.25 or 0.5MG /DOS, (OZEMPIC, 0.25 OR 0.5 MG/DOSE,) 2 MG/1.5ML SOPN Inject 0.5 mg into the skin once a week.  . spironolactone (ALDACTONE) 25 MG tablet Take 1 tablet (25 mg total) by mouth daily.  . traMADol (ULTRAM) 50 MG tablet Take 1 - 2 tablets every 6 hours as needed for pain   No facility-administered encounter medications on file as of 10/31/2020.    Current Diagnosis: Patient Active Problem List   Diagnosis Date Noted  . Morbid obesity with BMI of 45.0-49.9, adult ([place]) 10/30/2020  . Diabetes mellitus  ([place]) 04/17/2020  . Hyperlipidemia   . Essential hypertension   . GERD (gastroesophageal reflux disease)   . Chronic midline low back pain without sciatica 01/10/2020  . Back pain of lumbar region with sciatica 01/07/2020  . Other acute recurrent sinusitis 10/29/2019  . Cough 10/29/2019  . Varicose veins of lower extremities with other complications 93/81/0175  . Varicose veins of lower extremities with inflammation 06/14/2011    Called [employer] to check on application status.   [name] had been approved and is scheduled to arrive around March 7th. Desmond Dike is showing a rejection, not sure as to why, the representative is going to get her supervisor to process, but won't have an answer for at least 48 hours.    Spoke with patients daughter Brandi Velazquez and let her know the updated information.   Follow-Up:  Patient Assistance Coordination and Pharmacist Review  Brandi Velazquez, Brandi Velazquez notified  Brandi Velazquez, [place] Pharmacist Assistant [phone]

## 2020-11-02 ENCOUNTER — Encounter: Payer: Self-pay | Admitting: Family Medicine

## 2020-11-09 ENCOUNTER — Telehealth: Payer: Medicare HMO

## 2020-11-09 DIAGNOSIS — M6281 Muscle weakness (generalized): Secondary | ICD-10-CM | POA: Diagnosis not present

## 2020-11-09 DIAGNOSIS — M545 Low back pain, unspecified: Secondary | ICD-10-CM | POA: Diagnosis not present

## 2020-11-09 DIAGNOSIS — R2689 Other abnormalities of gait and mobility: Secondary | ICD-10-CM | POA: Diagnosis not present

## 2020-11-10 ENCOUNTER — Telehealth: Payer: Self-pay

## 2020-11-10 NOTE — Progress Notes (Signed)
    Chronic Care Management Pharmacy Assistant   Name: Brandi Velazquez  MRN: [id number] DOB: 1961/07/27  Reason for Encounter: Adherence  PCP : [name], MD  Allergies:   Allergies  Allergen Reactions  . Bactrim [Sulfamethoxazole-Trimethoprim] Swelling  . Morphine And Related     Medications: Outpatient Encounter Medications as of 11/10/2020  Medication Sig  . albuterol (VENTOLIN HFA) 108 (90 Base) MCG/ACT inhaler Inhale 2 puffs into the lungs every 6 (six) hours as needed for wheezing or shortness of breath.  Marland Kitchen glimepiride (AMARYL) 4 MG tablet Take 2 tablets (8 mg total) by mouth daily with breakfast.  . glucose blood (RELION GLUCOSE TEST STRIPS) test strip Use as instructed  . ibuprofen (ADVIL) 800 MG tablet Take 1 tablet (800 mg total) by mouth every 8 (eight) hours as needed.  . insulin degludec (TRESIBA FLEXTOUCH) 200 UNIT/ML FlexTouch Pen Inject 30 Units into the skin daily.  . metFORMIN (GLUCOPHAGE) 1000 MG tablet Take 1 tablet (1,000 mg total) by mouth 2 (two) times daily with a meal.  . Multiple Vitamin (MULTIVITAMIN) tablet Take 1 tablet by mouth daily.  . pioglitazone (ACTOS) 30 MG tablet Take 1 tablet (30 mg total) by mouth daily.  . pravastatin (PRAVACHOL) 40 MG tablet Take 1 tablet (40 mg total) by mouth at bedtime.  . ramipril (ALTACE) 2.5 MG capsule Take 1 capsule (2.5 mg total) by mouth daily.  . Semaglutide,0.25 or 0.5MG /DOS, (OZEMPIC, 0.25 OR 0.5 MG/DOSE,) 2 MG/1.5ML SOPN Inject 0.5 mg into the skin once a week.  . spironolactone (ALDACTONE) 25 MG tablet Take 1 tablet (25 mg total) by mouth daily.  . traMADol (ULTRAM) 50 MG tablet Take 1 - 2 tablets every 6 hours as needed for pain   No facility-administered encounter medications on file as of 11/10/2020.    Current Diagnosis: Patient Active Problem List   Diagnosis Date Noted  . Morbid obesity with BMI of 45.0-49.9, adult ([place]) 10/30/2020  . Diabetes mellitus ([place]) 04/17/2020  . Hyperlipidemia   .  Essential hypertension   . GERD (gastroesophageal reflux disease)   . Chronic midline low back pain without sciatica 01/10/2020  . Back pain of lumbar region with sciatica 01/07/2020  . Other acute recurrent sinusitis 10/29/2019  . Cough 10/29/2019  . Varicose veins of lower extremities with other complications 77/93/9030  . Varicose veins of lower extremities with inflammation 06/14/2011    Chart review noted the following changes for the patient: Diabetes:  Discontinue ReliOn 70/30. Start Tresiba 30 units daily. Will send prescription for Ozempic starting at 0.25 mg once weekly and then in 1 month increase to 0.5 mg weekly. Continue actos, metformin.  If sugars start to drop below 80, hold glimeperide.  Back pain:  Refer for physical therapy.  Refer for scooter evaluation.  Pneumonia and wheezing: Start breztri 2 puffs twice a day.  Get chest xray.  Use albuterol 2 puffs four times a day.   Patient has an appointment with Donette Larry, CPP on 12/07/20  Follow-Up:  Pharmacist Review  Donette Larry, [place], [place] Pharmacist Assistant [phone]

## 2020-11-15 DIAGNOSIS — M6281 Muscle weakness (generalized): Secondary | ICD-10-CM | POA: Diagnosis not present

## 2020-11-15 DIAGNOSIS — M545 Low back pain, unspecified: Secondary | ICD-10-CM | POA: Diagnosis not present

## 2020-11-15 DIAGNOSIS — R2689 Other abnormalities of gait and mobility: Secondary | ICD-10-CM | POA: Diagnosis not present

## 2020-11-17 DIAGNOSIS — M6281 Muscle weakness (generalized): Secondary | ICD-10-CM | POA: Diagnosis not present

## 2020-11-17 DIAGNOSIS — R2689 Other abnormalities of gait and mobility: Secondary | ICD-10-CM | POA: Diagnosis not present

## 2020-11-17 DIAGNOSIS — M545 Low back pain, unspecified: Secondary | ICD-10-CM | POA: Diagnosis not present

## 2020-11-21 ENCOUNTER — Ambulatory Visit (INDEPENDENT_AMBULATORY_CARE_PROVIDER_SITE_OTHER): Payer: Medicare HMO | Admitting: Nurse Practitioner

## 2020-11-21 ENCOUNTER — Other Ambulatory Visit: Payer: Self-pay

## 2020-11-21 ENCOUNTER — Encounter: Payer: Self-pay | Admitting: Nurse Practitioner

## 2020-11-21 VITALS — BP 148/88 | HR 98 | Temp 97.8°F | Ht 63.0 in | Wt 260.0 lb

## 2020-11-21 DIAGNOSIS — Z87891 Personal history of nicotine dependence: Secondary | ICD-10-CM

## 2020-11-21 DIAGNOSIS — R062 Wheezing: Secondary | ICD-10-CM

## 2020-11-21 DIAGNOSIS — R059 Cough, unspecified: Secondary | ICD-10-CM | POA: Diagnosis not present

## 2020-11-21 MED ORDER — IPRATROPIUM-ALBUTEROL 0.5-2.5 (3) MG/3ML IN SOLN: 3.0000 mL | Freq: Once | RESPIRATORY_TRACT | Status: DC

## 2020-11-21 MED ORDER — AZITHROMYCIN 250 MG PO TABS: ORAL_TABLET | ORAL | 0 refills | Status: DC

## 2020-11-21 MED ORDER — BENZONATATE 200 MG PO CAPS: 200.0000 mg | ORAL_CAPSULE | Freq: Three times a day (TID) | ORAL | 0 refills | Status: DC | PRN

## 2020-11-21 NOTE — Patient Instructions (Addendum)
Begin Zpak today as directed Tessalon Perles as needed for cough Obtain chest x-ray at [facility] as directed Cough, Adult A cough helps to clear your throat and lungs. A cough may be a sign of an illness or another medical condition. An acute cough may only last 2-3 weeks, while a chronic cough may last 8 or more weeks. Many things can cause a cough. They include:  Germs (viruses or bacteria) that attack the airway.  Breathing in things that bother (irritate) your lungs.  Allergies.  Asthma.  Mucus that runs down the back of your throat (postnasal drip).  Smoking.  Acid backing up from the stomach into the tube that moves food from the mouth to the stomach (gastroesophageal reflux).  Some medicines.  Lung problems.  Other medical conditions, such as heart failure or a blood clot in the lung (pulmonary embolism). Follow these instructions at home: Medicines  Take over-the-counter and prescription medicines only as told by your doctor.  Talk with your doctor before you take medicines that stop a cough (cough suppressants). Lifestyle  Do not smoke, and try not to be around smoke. Do not use any products that contain nicotine or tobacco, such as cigarettes, e-cigarettes, and chewing tobacco. If you need help quitting, ask your doctor.  Drink enough fluid to keep your pee (urine) pale yellow.  Avoid caffeine.  Do not drink alcohol if your doctor tells you not to drink.   General instructions  Watch for any changes in your cough. Tell your doctor about them.  Always cover your mouth when you cough.  Stay away from things that make you cough, such as perfume, candles, campfire smoke, or cleaning products.  If the air is dry, use a cool mist vaporizer or humidifier in your home.  If your cough is worse at night, try using extra pillows to raise your head up higher while you sleep.  Rest as needed.  Keep all follow-up visits as told by your doctor. This is important.    Contact a doctor if:  You have new symptoms.  You cough up pus.  Your cough does not get better after 2-3 weeks, or your cough gets worse.  Cough medicine does not help your cough and you are not sleeping well.  You have pain that gets worse or pain that is not helped with medicine.  You have a fever.  You are losing weight and you do not know why.  You have night sweats. Get help right away if:  You cough up blood.  You have trouble breathing.  Your heartbeat is very fast. These symptoms may be an emergency. Do not wait to see if the symptoms will go away. Get medical help right away. Call your local emergency services (911 in the [place] Do not drive yourself to the hospital. Summary  A cough helps to clear your throat and lungs. Many things can cause a cough.  Take over-the-counter and prescription medicines only as told by your doctor.  Always cover your mouth when you cough.  Contact a doctor if you have new symptoms or you have a cough that does not get better or gets worse. This information is not intended to replace advice given to you by your health care provider. Make sure you discuss any questions you have with your health care provider. Document Revised: 10/23/2019 Document Reviewed: 09/22/2018 Elsevier Patient Education  2021 [place] capsules What is this medicine? BENZONATATE (ben ZOE na tate) is used to treat cough.  This medicine may be used for other purposes; ask your health care provider or pharmacist if you have questions. COMMON BRAND NAME(S): Tessalon Perles, Zonatuss What should I tell my health care provider before I take this medicine? They need to know if you have any of these conditions:  kidney or liver disease  an unusual or allergic reaction to benzonatate, anesthetics, other medicines, foods, dyes, or preservatives  pregnant or trying to get pregnant  breast-feeding How should I use this medicine? Take this  medicine by mouth with a glass of water. Follow the directions on the prescription label. Avoid breaking, chewing, or sucking the capsule, as this can cause serious side effects. Take your medicine at regular intervals. Do not take your medicine more often than directed. Talk to your pediatrician regarding the use of this medicine in children. While this drug may be prescribed for children as young as 53 years old for selected conditions, precautions do apply. Overdosage: If you think you have taken too much of this medicine contact a poison control center or emergency room at once. NOTE: This medicine is only for you. Do not share this medicine with others. What if I miss a dose? If you miss a dose, take it as soon as you can. If it is almost time for your next dose, take only that dose. Do not take double or extra doses. What may interact with this medicine? Do not take this medicine with any of the following medications:  MAOIs like Carbex, Eldepryl, Marplan, Nardil, and Parnate This list may not describe all possible interactions. Give your health care provider a list of all the medicines, herbs, non-prescription drugs, or dietary supplements you use. Also tell them if you smoke, drink alcohol, or use illegal drugs. Some items may interact with your medicine. What should I watch for while using this medicine? Tell your doctor if your symptoms do not improve or if they get worse. If you have a high fever, skin rash, or headache, see your health care professional. You may get drowsy or dizzy. Do not drive, use machinery, or do anything that needs mental alertness until you know how this medicine affects you. Do not sit or stand up quickly, especially if you are an older patient. This reduces the risk of dizzy or fainting spells. What side effects may I notice from receiving this medicine? Side effects that you should report to your doctor or health care professional as soon as possible:  allergic  reactions like skin rash, itching or hives, swelling of the face, lips, or tongue  breathing problems  chest pain  confusion or hallucinations  irregular heartbeat  numbness of mouth or throat  seizures Side effects that usually do not require medical attention (report to your doctor or health care professional if they continue or are bothersome):  burning feeling in the eyes  constipation  headache  nasal congestion  stomach upset This list may not describe all possible side effects. Call your doctor for medical advice about side effects. You may report side effects to FDA at 1-800-FDA-1088. Where should I keep my medicine? Keep out of the reach of children. Store at room temperature between 15 and 30 degrees C (59 and 86 degrees F). Keep tightly closed. Protect from light and moisture. Throw away any unused medicine after the expiration date. NOTE: This sheet is a summary. It may not cover all possible information. If you have questions about this medicine, talk to your doctor, pharmacist, or health care  provider.  2021 Elsevier/Gold Standard (2007-12-03 14:52:56) Azithromycin tablets What is this medicine? AZITHROMYCIN (az ith roe MYE sin) is a macrolide antibiotic. It is used to treat or prevent certain kinds of bacterial infections. It will not work for colds, flu, or other viral infections. This medicine may be used for other purposes; ask your health care provider or pharmacist if you have questions. COMMON BRAND NAME(S): Zithromax, Zithromax Tri-Pak, Zithromax Z-Pak What should I tell my health care provider before I take this medicine? They need to know if you have any of these conditions:  history of blood diseases, like leukemia  history of irregular heartbeat  kidney disease  liver disease  myasthenia gravis  an unusual or allergic reaction to azithromycin, erythromycin, other macrolide antibiotics, foods, dyes, or preservatives  pregnant or trying to  get pregnant  breast-feeding How should I use this medicine? Take this medicine by mouth with a full glass of water. Follow the directions on the prescription label. The tablets can be taken with food or on an empty stomach. If the medicine upsets your stomach, take it with food. Take your medicine at regular intervals. Do not take your medicine more often than directed. Take all of your medicine as directed even if you think your are better. Do not skip doses or stop your medicine early. Talk to your pediatrician regarding the use of this medicine in children. While this drug may be prescribed for children as young as 6 months for selected conditions, precautions do apply. Overdosage: If you think you have taken too much of this medicine contact a poison control center or emergency room at once. NOTE: This medicine is only for you. Do not share this medicine with others. What if I miss a dose? If you miss a dose, take it as soon as you can. If it is almost time for your next dose, take only that dose. Do not take double or extra doses. What may interact with this medicine? Do not take this medicine with any of the following medications:  cisapride  dronedarone  pimozide  thioridazine This medicine may also interact with the following medications:  antacids that contain aluminum or magnesium  birth control pills  colchicine  cyclosporine  digoxin  ergot alkaloids like dihydroergotamine, ergotamine  nelfinavir  other medicines that prolong the QT interval (an abnormal heart rhythm)  phenytoin  warfarin This list may not describe all possible interactions. Give your health care provider a list of all the medicines, herbs, non-prescription drugs, or dietary supplements you use. Also tell them if you smoke, drink alcohol, or use illegal drugs. Some items may interact with your medicine. What should I watch for while using this medicine? Tell your doctor or healthcare provider  if your symptoms do not start to get better or if they get worse. This medicine may cause serious skin reactions. They can happen weeks to months after starting the medicine. Contact your healthcare provider right away if you notice fevers or flu-like symptoms with a rash. The rash may be red or purple and then turn into blisters or peeling of the skin. Or, you might notice a red rash with swelling of the face, lips or lymph nodes in your neck or under your arms. Do not treat diarrhea with over the counter products. Contact your doctor if you have diarrhea that lasts more than 2 days or if it is severe and watery. This medicine can make you more sensitive to the sun. Keep out of the  sun. If you cannot avoid being in the sun, wear protective clothing and use sunscreen. Do not use sun lamps or tanning beds/booths. What side effects may I notice from receiving this medicine? Side effects that you should report to your doctor or health care professional as soon as possible:  allergic reactions like skin rash, itching or hives, swelling of the face, lips, or tongue  bloody or watery diarrhea  breathing problems  chest pain  fast, irregular heartbeat  muscle weakness  rash, fever, and swollen lymph nodes  redness, blistering, peeling, or loosening of the skin, including inside the mouth  signs and symptoms of liver injury like dark yellow or brown urine; general ill feeling or flu-like symptoms; light-colored stools; loss of appetite; nausea; right upper belly pain; unusually weak or tired; yellowing of the eyes or skin  white patches or sores in the mouth  unusually weak or tired Side effects that usually do not require medical attention (report to your doctor or health care professional if they continue or are bothersome):  diarrhea  nausea  stomach pain  vomiting This list may not describe all possible side effects. Call your doctor for medical advice about side effects. You may  report side effects to FDA at 1-800-FDA-1088. Where should I keep my medicine? Keep out of the reach of children. Store at room temperature between 15 and 30 degrees C (59 and 86 degrees F). Throw away any unused medicine after the expiration date. NOTE: This sheet is a summary. It may not cover all possible information. If you have questions about this medicine, talk to your doctor, pharmacist, or health care provider.  2021 Elsevier/Gold Standard (2018-12-11 17:19:20)

## 2020-11-21 NOTE — Progress Notes (Signed)
Acute Office Visit  Subjective:    Patient ID: Brandi Velazquez, female    DOB: November 04, 1960, 60 y.o.   MRN: [id number]  CC: Cough with wheezing   HPI Brandi Velazquez is a 60 year old  is in today for cough, dyspnea, and wheezing. Symptoms aggravated with physical activity. Onset of symptoms was 6-weeks ago. Current treatment includes Breztri and Albuterol inhalers, Alka-Seltzer Plus Cough and Cold remedy. Pt was prescribed course of Cefdinir, Rocephin 1 gm IM, Kenalog 80 mg IM, and Prednisone taper on 10/13/20 for empirical treatment of suspected  pneumonia. COVID-19 test was negative. Chest x-ray performed on 10/27/20 negative for pneumonia.   Past Medical History:  Diagnosis Date  . Ankle pain   . Chronic pain    back  . COPD (chronic obstructive pulmonary disease) ([place])   . Depression   . Diabetes mellitus   . GERD (gastroesophageal reflux disease)   . Hyperlipidemia   . Hypertension   . Low back pain   . Obstructive sleep apnea   . Pneumonia 2010  . Post-menopausal atrophic vaginitis   . Superficial phlebitis     Past Surgical History:  Procedure Laterality Date  . BACK SURGERY  1990  . CESAREAN SECTION    . CYST REMOVAL HAND     inner thigh  . SPINE SURGERY     lumbar laminectomy and diskectomy    Family History  Problem Relation Age of Onset  . Diabetes Mother   . Heart disease Mother   . Cancer Father   . Stroke Sister     Social History   Socioeconomic History  . Marital status: Single    Spouse name: Not on file  . Number of children: Not on file  . Years of education: Not on file  . Highest education level: Not on file  Occupational History  . Not on file  Tobacco Use  . Smoking status: Former Smoker    Quit date: 03/20/2009    Years since quitting: 11.6  . Smokeless tobacco: Never Used  Substance and Sexual Activity  . Alcohol use: No  . Drug use: No  . Sexual activity: Not on file  Other Topics Concern  . Not on file  Social History Narrative  .  Not on file   Social Determinants of Health   Financial Resource Strain: Not on file  Food Insecurity: Unknown  . Worried About Charity fundraiser in the Last Year: Not on file  . Ran Out of Food in the Last Year: Never true  Transportation Needs: No Transportation Needs  . Lack of Transportation (Medical): No  . Lack of Transportation (Non-Medical): No  Physical Activity: Not on file  Stress: Not on file  Social Connections: Not on file  Intimate Partner Violence: Not on file    Outpatient Medications Prior to Visit  Medication Sig Dispense Refill  . albuterol (VENTOLIN HFA) 108 (90 Base) MCG/ACT inhaler Inhale 2 puffs into the lungs every 6 (six) hours as needed for wheezing or shortness of breath. 8 g 2  . glimepiride (AMARYL) 4 MG tablet Take 2 tablets (8 mg total) by mouth daily with breakfast. 90 tablet 1  . glucose blood (RELION GLUCOSE TEST STRIPS) test strip Use as instructed 100 each 2  . ibuprofen (ADVIL) 800 MG tablet Take 1 tablet (800 mg total) by mouth every 8 (eight) hours as needed. 30 tablet 0  . insulin degludec (TRESIBA FLEXTOUCH) 200 UNIT/ML FlexTouch Pen Inject 30 Units into the skin  daily. 15 mL 0  . metFORMIN (GLUCOPHAGE) 1000 MG tablet Take 1 tablet (1,000 mg total) by mouth 2 (two) times daily with a meal. 180 tablet 1  . Multiple Vitamin (MULTIVITAMIN) tablet Take 1 tablet by mouth daily.    . pioglitazone (ACTOS) 30 MG tablet Take 1 tablet (30 mg total) by mouth daily. 90 tablet 1  . pravastatin (PRAVACHOL) 40 MG tablet Take 1 tablet (40 mg total) by mouth at bedtime. 90 tablet 1  . ramipril (ALTACE) 2.5 MG capsule Take 1 capsule (2.5 mg total) by mouth daily. 90 capsule 1  . Semaglutide,0.25 or 0.5MG /DOS, (OZEMPIC, 0.25 OR 0.5 MG/DOSE,) 2 MG/1.5ML SOPN Inject 0.5 mg into the skin once a week. 1.5 mL 2  . spironolactone (ALDACTONE) 25 MG tablet Take 1 tablet (25 mg total) by mouth daily. 90 tablet 1  . traMADol (ULTRAM) 50 MG tablet Take 1 - 2 tablets every  6 hours as needed for pain     No facility-administered medications prior to visit.    Allergies  Allergen Reactions  . Bactrim [Sulfamethoxazole-Trimethoprim] Swelling  . Morphine And Related     Review of Systems  Constitutional: Positive for fatigue. Negative for chills, diaphoresis and fever.  HENT: Negative for congestion, ear pain, rhinorrhea and sore throat.   Respiratory: Positive for cough, shortness of breath and wheezing.   Cardiovascular: Negative for chest pain.  Gastrointestinal: Negative for abdominal pain, constipation, diarrhea, nausea and rectal pain.  Musculoskeletal: Negative for arthralgias and myalgias.  Neurological: Positive for headaches.  Psychiatric/Behavioral: Positive for sleep disturbance (related to cough).       Objective:    BP (!) 148/88 (BP Location: Left Arm, Patient Position: Sitting)   Pulse 98   Temp 97.8 F (36.6 C) (Temporal)   Ht 5\' 3"  (1.6 m)   Wt 260 lb (117.9 kg)   SpO2 97%   BMI 46.06 kg/m  Physical Exam Vitals reviewed.  Constitutional:      Appearance: She is obese.  HENT:     Head: Normocephalic.     Right Ear: Tympanic membrane normal.     Left Ear: Tympanic membrane normal.     Mouth/Throat:     Mouth: Mucous membranes are moist.  Cardiovascular:     Rate and Rhythm: Normal rate and regular rhythm.     Pulses: Normal pulses.     Heart sounds: Normal heart sounds.  Pulmonary:     Breath sounds: Wheezing and rales present.     Comments: Congested cough, audible wheezing  Abdominal:     General: Bowel sounds are normal.     Palpations: Abdomen is soft.  Skin:    General: Skin is warm and dry.     Capillary Refill: Capillary refill takes less than 2 seconds.  Neurological:     General: No focal deficit present.     Mental Status: She is alert and oriented to person, place, and time.  Psychiatric:        Mood and Affect: Mood normal.        Behavior: Behavior normal.      Wt Readings from Last 3  Encounters:  10/27/20 266 lb (120.7 kg)  07/18/20 265 lb (120.2 kg)  07/06/20 265 lb (120.2 kg)    Health Maintenance Due  Topic Date Due  . DEXA SCAN  Never done  . Hepatitis C Screening  Never done  . HIV Screening  Never done  . TETANUS/TDAP  Never done  . PAP SMEAR-Modifier  Never done  . COVID-19 Vaccine (3 - Booster for Moderna series) 05/01/2020    Lab Results  Component Value Date   WBC 13.0 (H) 10/27/2020   HGB 14.9 10/27/2020   HCT 45.6 10/27/2020   MCV 87 10/27/2020   PLT 252 10/27/2020   Lab Results  Component Value Date   NA 141 10/27/2020   K 4.6 10/27/2020   CO2 23 10/27/2020   GLUCOSE 93 10/27/2020   BUN 16 10/27/2020   CREATININE 0.72 10/27/2020   BILITOT 0.3 10/27/2020   ALKPHOS 55 10/27/2020   AST 14 10/27/2020   ALT 20 10/27/2020   PROT 6.7 10/27/2020   ALBUMIN 4.3 10/27/2020   CALCIUM 9.7 10/27/2020   Lab Results  Component Value Date   CHOL 159 10/27/2020   Lab Results  Component Value Date   HDL 63 10/27/2020   Lab Results  Component Value Date   LDLCALC 82 10/27/2020   Lab Results  Component Value Date   TRIG 75 10/27/2020   Lab Results  Component Value Date   CHOLHDL 2.5 10/27/2020   Lab Results  Component Value Date   HGBA1C 8.0 (H) 10/27/2020       Assessment & Plan:    1. Cough - benzonatate (TESSALON) 200 MG capsule; Take 1 capsule (200 mg total) by mouth 3 (three) times daily as needed for cough.  Dispense: 30 capsule; Refill: 0 - azithromycin (ZITHROMAX) 250 MG tablet; Take two tablets by mouth on day one, take one tablet by mouth days two-five  Dispense: 6 tablet; Refill: 0 - ipratropium-albuterol (DUONEB) 0.5-2.5 (3) MG/3ML nebulizer solution 3 mL - DG Chest 2 View  2. Wheezing - ipratropium-albuterol (DUONEB) 0.5-2.5 (3) MG/3ML nebulizer solution 3 mL - DG Chest 2 View  3. Former smoker - DG Chest 2 [place] today as directed [employer] as needed for cough Obtain chest x-ray at  [facility] as directed Seek emergency medical care for severe shortness of breath or other life threatening concerns Follow-up as needed    Follow-up: Prn, if symptoms fail to improve or worsen   Signed,  Rip Harbour, NP

## 2020-11-22 ENCOUNTER — Ambulatory Visit: Payer: Medicare HMO

## 2020-11-23 ENCOUNTER — Telehealth: Payer: Self-pay

## 2020-11-23 NOTE — Progress Notes (Signed)
° ° °  Chronic Care Management Pharmacy Assistant   Name: Brandi Velazquez  MRN: [id number] DOB: 1961/09/11  Reason for Encounter: Medication Review for [name] and Ozempic PAP status    Medications: Outpatient Encounter Medications as of 11/23/2020  Medication Sig   albuterol (VENTOLIN HFA) 108 (90 Base) MCG/ACT inhaler Inhale 2 puffs into the lungs every 6 (six) hours as needed for wheezing or shortness of breath.   azithromycin (ZITHROMAX) 250 MG tablet Take two tablets by mouth on day one, take one tablet by mouth days two-five   benzonatate (TESSALON) 200 MG capsule Take 1 capsule (200 mg total) by mouth 3 (three) times daily as needed for cough.   glimepiride (AMARYL) 4 MG tablet Take 2 tablets (8 mg total) by mouth daily with breakfast.   glucose blood (RELION GLUCOSE TEST STRIPS) test strip Use as instructed   ibuprofen (ADVIL) 800 MG tablet Take 1 tablet (800 mg total) by mouth every 8 (eight) hours as needed.   insulin degludec (TRESIBA FLEXTOUCH) 200 UNIT/ML FlexTouch Pen Inject 30 Units into the skin daily.   metFORMIN (GLUCOPHAGE) 1000 MG tablet Take 1 tablet (1,000 mg total) by mouth 2 (two) times daily with a meal.   Multiple Vitamin (MULTIVITAMIN) tablet Take 1 tablet by mouth daily.   pioglitazone (ACTOS) 30 MG tablet Take 1 tablet (30 mg total) by mouth daily.   pravastatin (PRAVACHOL) 40 MG tablet Take 1 tablet (40 mg total) by mouth at bedtime.   ramipril (ALTACE) 2.5 MG capsule Take 1 capsule (2.5 mg total) by mouth daily.   Semaglutide,0.25 or 0.5MG /DOS, (OZEMPIC, 0.25 OR 0.5 MG/DOSE,) 2 MG/1.5ML SOPN Inject 0.5 mg into the skin once a week.   spironolactone (ALDACTONE) 25 MG tablet Take 1 tablet (25 mg total) by mouth daily.   traMADol (ULTRAM) 50 MG tablet Take 1 - 2 tablets every 6 hours as needed for pain   Facility-Administered Encounter Medications as of 11/23/2020  Medication   ipratropium-albuterol (DUONEB) 0.5-2.5 (3) MG/3ML nebulizer solution 3 mL    Donette Larry, CPP, checked at the office and they had not received them as of 11/22/20.  Called [facility] to check on the PAP status of the patients Ozempic and [name] shipments.  I was going to request a voucher for each medication to be filled with [facility] until her shipment arrived.  The representative stated that both medications were out for delivery, and should arrive at the office by 11am today.  I notified Donette Larry, CPP, so she would be on the look out for them.    I called and notified the patient as well.  I assured her she did not have to pay for these as they were coming form PAP, and would always be shipped to the office, and her other medications would still come from [facility] and be delivered to her home.  Patient understood and was going to call the office later to make sure they are there before she comes.   Clarita Leber, [place] Pharmacist Assistant [phone]

## 2020-11-28 DIAGNOSIS — Z87891 Personal history of nicotine dependence: Secondary | ICD-10-CM | POA: Diagnosis not present

## 2020-11-28 DIAGNOSIS — R059 Cough, unspecified: Secondary | ICD-10-CM | POA: Diagnosis not present

## 2020-11-28 DIAGNOSIS — R062 Wheezing: Secondary | ICD-10-CM | POA: Diagnosis not present

## 2020-11-28 DIAGNOSIS — R0602 Shortness of breath: Secondary | ICD-10-CM | POA: Diagnosis not present

## 2020-12-07 ENCOUNTER — Ambulatory Visit (INDEPENDENT_AMBULATORY_CARE_PROVIDER_SITE_OTHER): Payer: Medicare HMO

## 2020-12-07 ENCOUNTER — Other Ambulatory Visit: Payer: Self-pay

## 2020-12-07 DIAGNOSIS — E782 Mixed hyperlipidemia: Secondary | ICD-10-CM | POA: Diagnosis not present

## 2020-12-07 DIAGNOSIS — E1142 Type 2 diabetes mellitus with diabetic polyneuropathy: Secondary | ICD-10-CM | POA: Diagnosis not present

## 2020-12-07 DIAGNOSIS — I10 Essential (primary) hypertension: Secondary | ICD-10-CM

## 2020-12-07 NOTE — Progress Notes (Signed)
Chronic Care Management Pharmacy Note  12/08/2020 Name:  Brandi Velazquez MRN:  [id number] DOB:  03-23-1961   Plan Recommendations:   Patient reports that blood sugar has been elevated since taking steroids for pneumonia. She is going to continue Ozempic 0.25 mg weekly for 3 more doses. We will speak before increasing dose to evaluate symptoms and blood sugar readings.   Patient had to postpone PT due to pneumonia but hopes to resume.   Subjective: Brandi Velazquez is an 60 y.o. year old female who is a primary patient of [name], Kirsten, MD.  The CCM team was consulted for assistance with disease management and care coordination needs.    Engaged with patient by telephone for follow up visit in response to provider referral for pharmacy case management and/or care coordination services.   Consent to Services:  The patient was given information about Chronic Care Management services, agreed to services, and gave verbal consent prior to initiation of services.  Please see initial visit note for detailed documentation.   Patient Care Team: [name], MD as PCP - General (Family Medicine) Burnice Logan, [facility] as Pharmacist (Pharmacist)  Recent office visits: 11/21/2020 - cough - azithromycin, benzonotate and duoneb.  10/27/2020 -  Continue current medications.   Recent consult visits:  Hospital visits: None in previous 6 months  Objective:  Lab Results  Component Value Date   CREATININE 0.72 10/27/2020   BUN 16 10/27/2020   GFRNONAA 91 10/27/2020   GFRAA 105 10/27/2020   NA 141 10/27/2020   K 4.6 10/27/2020   CALCIUM 9.7 10/27/2020   CO2 23 10/27/2020   GLUCOSE 93 10/27/2020    Lab Results  Component Value Date/Time   HGBA1C 8.0 (H) 10/27/2020 10:43 AM   HGBA1C 6.7 (H) 07/18/2020 10:51 AM   MICROALBUR 30 07/24/2020 11:45 PM    Last diabetic Eye exam:  Lab Results  Component Value Date/Time   HMDIABEYEEXA No Retinopathy 03/07/2020 12:00 AM    Last diabetic Foot  exam: No results found for: HMDIABFOOTEX   Lab Results  Component Value Date   CHOL 159 10/27/2020   HDL 63 10/27/2020   LDLCALC 82 10/27/2020   TRIG 75 10/27/2020   CHOLHDL 2.5 10/27/2020    Hepatic Function Latest Ref Rng & Units 10/27/2020 07/18/2020 04/15/2020  Total Protein 6.0 - 8.5 g/dL 6.7 7.2 6.6  Albumin 3.8 - 4.9 g/dL 4.3 4.4 4.2  AST 0 - 40 IU/L 14 14 15   ALT 0 - 32 IU/L 20 17 17   Alk Phosphatase 44 - 121 IU/L 55 57 61  Total Bilirubin 0.0 - 1.2 mg/dL 0.3 0.2 0.2    No results found for: TSH, FREET4  CBC Latest Ref Rng & Units 10/27/2020 07/18/2020 04/15/2020  WBC 3.4 - 10.8 x10E3/uL 13.0(H) 9.1 10.4  Hemoglobin 11.1 - 15.9 g/dL 14.9 13.8 14.0  Hematocrit 34.0 - 46.6 % 45.6 43.6 43.5  Platelets 150 - 450 x10E3/uL 252 230 234    No results found for: VD25OH  Clinical ASCVD: No  The 10-year ASCVD risk score Mikey Bussing DC Jr., et al., 2013) is: 8.6%   Values used to calculate the score:     Age: 60 years     Sex: Female     Is Non-Hispanic African American: No     Diabetic: Yes     Tobacco smoker: No     Systolic Blood Pressure: 009 mmHg     Is BP treated: Yes     HDL  Cholesterol: 63 mg/dL     Total Cholesterol: 159 mg/dL    Depression screen [facility] 2/9 07/18/2020 07/13/2016  Decreased Interest 0 0  Down, Depressed, Hopeless 0 0  PHQ - 2 Score 0 0     Social History   Tobacco Use  Smoking Status Former Smoker  . Quit date: 03/20/2009  . Years since quitting: 11.7  Smokeless Tobacco Never Used   BP Readings from Last 3 Encounters:  11/21/20 (!) 148/88  10/27/20 132/80  10/13/20 126/68   Pulse Readings from Last 3 Encounters:  11/21/20 98  10/27/20 89  10/13/20 78   Wt Readings from Last 3 Encounters:  11/21/20 260 lb (117.9 kg)  10/27/20 266 lb (120.7 kg)  07/18/20 265 lb (120.2 kg)   BMI Readings from Last 3 Encounters:  11/21/20 46.06 kg/m  10/27/20 47.12 kg/m  10/13/20 46.94 kg/m    Assessment/Interventions: Review of patient past medical  history, allergies, medications, health status, including review of consultants reports, laboratory and other test data, was performed as part of comprehensive evaluation and provision of chronic care management services.   SDOH:  (Social Determinants of Health) assessments and interventions performed: Yes   CCM Care Plan  Allergies  Allergen Reactions  . Bactrim [Sulfamethoxazole-Trimethoprim] Swelling  . Morphine And Related     Medications Reviewed Today    Reviewed by Burnice Logan, [facility] (Pharmacist) on 12/08/20 at 1253  Med List Status: <None>  Medication Order Taking? Sig Documenting Provider Last Dose Status Informant  albuterol (VENTOLIN HFA) 108 (90 Base) MCG/ACT inhaler [id number] Yes Inhale 2 puffs into the lungs every 6 (six) hours as needed for wheezing or shortness of breath. [name], Kirsten, MD Taking Active   azithromycin (ZITHROMAX) 250 MG tablet [id number] No Take two tablets by mouth on day one, take one tablet by mouth days two-five  Patient not taking: Reported on 12/07/2020   Rip Harbour, NP Not Taking Consider Medication Status and Discontinue   benzonatate (TESSALON) 200 MG capsule [id number] No Take 1 capsule (200 mg total) by mouth 3 (three) times daily as needed for cough.  Patient not taking: Reported on 12/07/2020   Rip Harbour, NP Not Taking Active   glimepiride (AMARYL) 4 MG tablet [id number] Yes Take 2 tablets (8 mg total) by mouth daily with breakfast. [name], Kirsten, MD Taking Active   glucose blood (RELION GLUCOSE TEST STRIPS) test strip [id number] Yes Use as instructed [name], [name], MD Taking Active   ibuprofen (ADVIL) 800 MG tablet [id number] Yes Take 1 tablet (800 mg total) by mouth every 8 (eight) hours as needed. [name], Kirsten, MD Taking Active   insulin degludec (TRESIBA FLEXTOUCH) 200 UNIT/ML FlexTouch Pen [id number] Yes Inject 30 Units into the skin daily. [name], Kirsten, MD Taking Active   ipratropium-albuterol (DUONEB) 0.5-2.5 (3) MG/3ML nebulizer  solution 3 mL [id number]   Rip Harbour, NP  Active   metFORMIN (GLUCOPHAGE) 1000 MG tablet [id number] Yes Take 1 tablet (1,000 mg total) by mouth 2 (two) times daily with a meal. [name], Kirsten, MD Taking Active   Multiple Vitamin (MULTIVITAMIN) tablet [id number] Yes Take 1 tablet by mouth daily. [provider] Taking Active   pioglitazone (ACTOS) 30 MG tablet [id number] Yes Take 1 tablet (30 mg total) by mouth daily. [name], Kirsten, MD Taking Active   pravastatin (PRAVACHOL) 40 MG tablet [id number] Yes Take 1 tablet (40 mg total) by mouth at bedtime. [name], Kirsten, MD Taking Active   ramipril (ALTACE) 2.5 MG capsule [id number] Yes  Take 1 capsule (2.5 mg total) by mouth daily. [name], Kirsten, MD Taking Active   Semaglutide,0.25 or 0.5MG/DOS, (OZEMPIC, 0.25 OR 0.5 MG/DOSE,) 2 MG/1.5ML SOPN [id number] Yes Inject 0.5 mg into the skin once a week.  Patient taking differently: Inject 0.5 mg into the skin once a week. 0.25 mg weekly for 4 weeks then increase to 0.5 mg weekly   [name], Kirsten, MD Taking Active   spironolactone (ALDACTONE) 25 MG tablet [id number] Yes Take 1 tablet (25 mg total) by mouth daily. [name], Kirsten, MD Taking Active   traMADol Veatrice Bourbon) 50 MG tablet [id number] Yes Take 1 - 2 tablets every 6 hours as needed for pain [provider] Taking Active           Patient Active Problem List   Diagnosis Date Noted  . Morbid obesity with BMI of 45.0-49.9, adult ([place]) 10/30/2020  . Diabetes mellitus ([place]) 04/17/2020  . Hyperlipidemia   . Essential hypertension   . GERD (gastroesophageal reflux disease)   . Chronic midline low back pain without sciatica 01/10/2020  . Back pain of lumbar region with sciatica 01/07/2020  . Other acute recurrent sinusitis 10/29/2019  . Cough 10/29/2019  . Varicose veins of lower extremities with other complications 56/31/4970  . Varicose veins of lower extremities with inflammation 06/14/2011    Immunization History  Administered Date(s)  Administered  . Influenza Inj Mdck Quad Pf 07/18/2020  . Moderna Sars-Covid-2 Vaccination 10/07/2019, 11/02/2019  . Pneumococcal Polysaccharide-23 10/09/2011    Conditions to be addressed/monitored:  Hyperlipidemia and Diabetes  Care Plan : [place]  Updates made by Burnice Logan, RPH since 12/08/2020 12:00 AM    Problem: htn, hld, dm   Priority: High  Onset Date: 12/07/2020    Long-Range Goal: Disease Management   Start Date: 12/07/2020  Expected End Date: 12/08/2021  This Visit's Progress: On track  Priority: High  Note:    Current Barriers:  . Unable to independently afford treatment regimen . Unable to achieve control of blood sugar    Pharmacist Clinical Goal(s):  Marland Kitchen Patient will verbalize ability to afford treatment regimen . achieve control of diabetes as evidenced by blood sugar through collaboration with PharmD and provider.   Interventions: . 1:1 collaboration with [name], MD regarding development and update of comprehensive plan of care as evidenced by provider attestation and co-signature . Inter-disciplinary care team collaboration (see longitudinal plan of care) . Comprehensive medication review performed; medication list updated in electronic medical record  Hypertension (BP goal <130/80) -Controlled -Current treatment: . ramipril 2.5 mg daily  . Spironolactone 25 mg daily  -Medications previously tried: none reported  -Current home readings: well controlled per patient  -Current dietary habits: has had very little appetite since recovering from pneumonia -Current exercise habits: limited due to mobility -Denies hypotensive/hypertensive symptoms -Educated on BP goals and benefits of medications for prevention of heart attack, stroke and kidney damage; Daily salt intake goal < 2300 mg; Exercise goal of 150 minutes per week; -Counseled to monitor BP at home weekly, document, and provide log at future appointments -Counseled on diet and  exercise extensively Recommended to continue current medication  Hyperlipidemia: (LDL goal < 70) -Not ideally controlled -Current treatment: . pravastatin 40 mg at bedtime  -Medications previously tried: none reported  -Current dietary patterns: limited appetite lately since pneumonia -Current exercise habits: limited due to mobility -Educated on Cholesterol goals;  Benefits of statin for ASCVD risk reduction; Importance of limiting foods high in cholesterol; Exercise  goal of 150 minutes per week; -Counseled on diet and exercise extensively Recommended to continue current medication  Diabetes (A1c goal <7%) -Not ideally controlled -Current medications: . Ozmepic 0.25 mg weekly for 4 weeks then 0.5 mg weekly for 4 weeks then 1 mg weekly if tolerating well . Tresiba 30 units  . Actos 30 mg daily  . Glimepiride 4 mg daily with breakfast . Relion testing supplies  . Metformin 1000 mg bid  -Medications previously tried: 70/30 insulin   -Current home glucose readings . fasting glucose: 200s since steroids/pneumonia -Denies hypoglycemic/hyperglycemic symptoms -Current meal patterns:  . Since recovering from pneumonia - she has been drinking plenty of fluids and eating some soup.  -Current exercise: limited due to mobility and recovering from pneumonia. She had to postpone physical therapy due to pneumonia.  -Educated on A1c and blood sugar goals; Complications of diabetes including kidney damage, retinal damage, and cardiovascular disease; Exercise goal of 150 minutes per week; Benefits of weight loss; Benefits of routine self-monitoring of blood sugar; Carbohydrate counting and/or plate method -Counseled to check feet daily and get yearly eye exams -Counseled on diet and exercise extensively Recommended to continue current medication   Patient Goals/Self-Care Activities . Patient will:  - take medications as prescribed focus on medication adherence by using pill box check  glucose daily, document, and provide at future appointments target a minimum of 150 minutes of moderate intensity exercise weekly engage in dietary modifications by limiting carbohydrates and balancing meals using plate method.   Follow Up Plan: Telephone follow up appointment with care management team member scheduled for: 12/2020      Medication Assistance: [name] and Ozempicobtained through [employer] medication assistance program.  Enrollment ends 08/16/2021  Patient's preferred pharmacy is:  [facility] [place], [place] [address] Phone: [phone] Fax: [phone]  [facility] - [place], [place] - [address] [place] [place] [address] Phone: [phone] Fax: [phone]  Uses pill box? Yes Pt endorses good compliance  We discussed: Benefits of medication synchronization, packaging and delivery as well as enhanced pharmacist oversight with [facility]. Patient decided to: Utilize [facility] pharmacy for medication synchronization, packaging and delivery  Care Plan and Follow Up Patient Decision:  Patient agrees to Care Plan and Follow-up.  Plan: Telephone follow up appointment with care management team member scheduled for:  12/27/2020

## 2020-12-08 NOTE — Patient Instructions (Addendum)
Visit Information  Goals Addressed            This Visit's Progress   . Learn More About My Health       Timeframe:  Long-Range Goal Priority:  High Start Date:                             Expected End Date:                        Follow Up Date 12/27/2020   - tell my story and reason for my visit - repeat what I heard to make sure I understand - bring a list of my medicines to the visit - speak up when I don't understand    Why is this important?    The best way to learn about your health and care is by talking to the doctor and nurse.   They will answer your questions and give you information in the way that you like best.    Notes:     Marland Kitchen Monitor and Manage My Blood Sugar-Diabetes Type 2       Timeframe:  Long-Range Goal Priority:  High Start Date:                             Expected End Date:                       Follow Up Date 12/27/2020    - check blood sugar at prescribed times - take the blood sugar log to all doctor visits    Why is this important?    Checking your blood sugar at home helps to keep it from getting very high or very low.   Writing the results in a diary or log helps the doctor know how to care for you.   Your blood sugar log should have the time, date and the results.   Also, write down the amount of insulin or other medicine that you take.   Other information, like what you ate, exercise done and how you were feeling, will also be helpful.     Notes:     . Set My Target A1C-Diabetes Type 2       Timeframe:  Long-Range Goal Priority:  High Start Date:                             Expected End Date:                       Follow Up Date 12/27/2020    - set target A1C    Why is this important?    Your target A1C is decided together by you and your doctor.   It is based on several things like your age and other health issues.    Notes:       Patient Care Plan: CCM Pharmacy Care Plan    Problem Identified: htn, hld, dm    Priority: High  Onset Date: 12/07/2020    Long-Range Goal: Disease Management   Start Date: 12/07/2020  Expected End Date: 12/08/2021  This Visit's Progress: On track  Priority: High  Note:    Current Barriers:  . Unable to independently afford treatment regimen . Unable to achieve control of blood  sugar    Pharmacist Clinical Goal(s):  Marland Kitchen Patient will verbalize ability to afford treatment regimen . achieve control of diabetes as evidenced by blood sugar through collaboration with PharmD and provider.   Interventions: . 1:1 collaboration with [name], MD regarding development and update of comprehensive plan of care as evidenced by provider attestation and co-signature . Inter-disciplinary care team collaboration (see longitudinal plan of care) . Comprehensive medication review performed; medication list updated in electronic medical record  Hypertension (BP goal <130/80) -Controlled -Current treatment: . ramipril 2.5 mg daily  . Spironolactone 25 mg daily  -Medications previously tried: none reported  -Current home readings: well controlled per patient  -Current dietary habits: has had very little appetite since recovering from pneumonia -Current exercise habits: limited due to mobility -Denies hypotensive/hypertensive symptoms -Educated on BP goals and benefits of medications for prevention of heart attack, stroke and kidney damage; Daily salt intake goal < 2300 mg; Exercise goal of 150 minutes per week; -Counseled to monitor BP at home weekly, document, and provide log at future appointments -Counseled on diet and exercise extensively Recommended to continue current medication  Hyperlipidemia: (LDL goal < 70) -Not ideally controlled -Current treatment: . pravastatin 40 mg at bedtime  -Medications previously tried: none reported  -Current dietary patterns: limited appetite lately since pneumonia -Current exercise habits: limited due to mobility -Educated on  Cholesterol goals;  Benefits of statin for ASCVD risk reduction; Importance of limiting foods high in cholesterol; Exercise goal of 150 minutes per week; -Counseled on diet and exercise extensively Recommended to continue current medication  Diabetes (A1c goal <7%) -Not ideally controlled -Current medications: . Ozmepic 0.25 mg weekly for 4 weeks then 0.5 mg weekly for 4 weeks then 1 mg weekly if tolerating well . Tresiba 30 units  . Actos 30 mg daily  . Glimepiride 4 mg daily with breakfast . Relion testing supplies  . Metformin 1000 mg bid  -Medications previously tried: 70/30 insulin   -Current home glucose readings . fasting glucose: 200s since steroids/pneumonia -Denies hypoglycemic/hyperglycemic symptoms -Current meal patterns:  . Since recovering from pneumonia - she has been drinking plenty of fluids and eating some soup.  -Current exercise: limited due to mobility and recovering from pneumonia. She had to postpone physical therapy due to pneumonia.  -Educated on A1c and blood sugar goals; Complications of diabetes including kidney damage, retinal damage, and cardiovascular disease; Exercise goal of 150 minutes per week; Benefits of weight loss; Benefits of routine self-monitoring of blood sugar; Carbohydrate counting and/or plate method -Counseled to check feet daily and get yearly eye exams -Counseled on diet and exercise extensively Recommended to continue current medication   Patient Goals/Self-Care Activities . Patient will:  - take medications as prescribed focus on medication adherence by using pill box check glucose daily, document, and provide at future appointments target a minimum of 150 minutes of moderate intensity exercise weekly engage in dietary modifications by limiting carbohydrates and balancing meals using plate method.   Follow Up Plan: Telephone follow up appointment with care management team member scheduled for: 12/2020      The patient  verbalized understanding of instructions, educational materials, and care plan provided today and declined offer to receive copy of patient instructions, educational materials, and care plan.  Telephone follow up appointment with pharmacy team member scheduled for: 12/27/2020  Brandi Velazquez, [facility]  Blood Glucose Monitoring, Adult Monitoring your blood sugar (glucose) is an important part of managing your diabetes. Blood glucose monitoring involves checking  your blood glucose as often as directed and keeping a log or record of your results over time. Checking your blood glucose regularly and keeping a blood glucose log can:  Help you and your health care provider adjust your diabetes management plan as needed, including your medicines or insulin.  Help you understand how food, exercise, illnesses, and medicines affect your blood glucose.  Let you know what your blood glucose is at any time. You can quickly find out if you have low blood glucose (hypoglycemia) or high blood glucose (hyperglycemia). Your health care provider will set individualized treatment goals for you. Your goals will be based on your age, other medical conditions you have, and how you respond to diabetes treatment. Generally, the goal of treatment is to maintain the following blood glucose levels:  Before meals (preprandial): 80-130 mg/dL (4.4-7.2 mmol/L).  After meals (postprandial): below 180 mg/dL (10 mmol/L).  A1C level: less than 7%. Supplies needed:  Blood glucose meter.  Test strips for your meter. Each meter has its own strips. You must use the strips that came with your meter.  A needle to prick your finger (lancet). Do not use a lancet more than one time.  A device that holds the lancet (lancing device).  A journal or log book to write down your results. How to check your blood glucose Checking your blood glucose 1. Wash your hands for at least 20 seconds with soap and water. 2. Prick the side of your  finger (not the tip) with the lancet. Do not use the same finger consecutively. 3. Gently rub the finger until a small drop of blood appears. 4. Follow instructions that come with your meter for inserting the test strip, applying blood to the strip, and using your blood glucose meter. 5. Write down your result and any notes in your log.   Using alternative sites Some meters allow you to use areas of your body other than your finger (alternative sites) to test your blood. The most common alternative sites are the forearm, the thigh, and the palm of your hand. Alternative sites may not be as accurate as the fingers because blood flow is slower in those areas. This means that the result you get may be delayed, and it may be different from the result that you would get from your finger. Use the finger only, and do not use alternative sites, if:  You think you have hypoglycemia.  You sometimes do not know that your blood glucose is getting low (hypoglycemia unawareness). General tips and recommendations Blood glucose log  Every time you check your blood glucose, write down your result. Also write down any notes about things that may be affecting your blood glucose, such as your diet and exercise for the day. This information can help you and your health care provider: ? Look for patterns in your blood glucose over time. ? Adjust your diabetes management plan as needed.  Check if your meter allows you to download your records to a computer or if there is an app for the meter. Most glucose meters store a record of glucose readings in the meter.   If you have type 1 diabetes:  Check your blood glucose 4 or more times a day if you are on intensive insulin therapy with multiple daily injections (MDI) or if you are using an insulin pump. Check your blood glucose: ? Before every meal and snack. ? Before bedtime.  Also check your blood glucose: ? If you have symptoms  of hypoglycemia. ? After treating  low blood glucose. ? Before doing activities that create a risk for injury, like driving or using machinery. ? Before and after exercise. ? Two hours after a meal. ? Occasionally between 2:00 a.m. and 3:00 a.m., as directed.  You may need to check your blood glucose more often, 6-10 times per day, if: ? You have diabetes that is not well controlled. ? You are ill. ? You have a history of severe hypoglycemia. ? You have hypoglycemia unawareness. If you have type 2 diabetes:  Check your blood glucose 2 or more times a day if you take insulin or other diabetes medicines.  Check your blood glucose 4 or more times a day if you are on intensive insulin therapy. Occasionally, you may also need to check your glucose between 2:00 a.m. and 3:00 a.m., as directed.  Also check your blood glucose: ? Before and after exercise. ? Before doing activities that create a risk for injury, like driving or using machinery.  You may need to check your blood glucose more often if: ? Your medicine is being adjusted. ? Your diabetes is not well controlled. ? You are ill. General tips  Make sure you always have your supplies with you.  After you use a few boxes of test strips, adjust (calibrate) your blood glucose meter by following instructions that came with your meter.  If you have questions or need help, all blood glucose meters have a 24-hour hotline phone number available that you can call. Also contact your health care provider with questions or concerns you may have. Where to find more information  The American Diabetes Association: www.diabetes.org  The Association of Diabetes Care & Education Specialists: www.diabeteseducator.org Contact a health care provider if:  Your blood glucose is at or above 240 mg/dL (13.3 mmol/L) for 2 days in a row.  You have been sick or have had a fever for 2 days or longer, and you are not getting better.  You have any of the following problems for more than 6  hours: ? You cannot eat or drink. ? You have nausea or vomiting. ? You have diarrhea. Get help right away if:  Your blood glucose is lower than 54 mg/dL (3 mmol/L).  You become confused, or you have trouble thinking clearly.  You have difficulty breathing.  You have moderate or large ketone levels in your urine. These symptoms may represent a serious problem that is an emergency. Do not wait to see if the symptoms will go away. Get medical help right away. Call your local emergency services (911 in the [place] Do not drive yourself to the hospital. Summary  Monitoring your blood glucose is an important part of managing your diabetes..  Blood glucose monitoring involves checking your blood glucose as often as directed and keeping a log or record of your results over time.  Your health care provider will set individualized treatment goals for you. Your goals will be based on your age, other medical conditions you have, and how you respond to diabetes treatment.  Every time you check your blood glucose, write down your result. Also, write down any notes about things that may be affecting your blood glucose, such as your diet and exercise for the day. This information is not intended to replace advice given to you by your health care provider. Make sure you discuss any questions you have with your health care provider. Document Revised: 06/01/2020 Document Reviewed: 06/01/2020 Elsevier Patient Education  2021  [employer].

## 2020-12-27 ENCOUNTER — Other Ambulatory Visit: Payer: Self-pay

## 2020-12-27 ENCOUNTER — Ambulatory Visit (INDEPENDENT_AMBULATORY_CARE_PROVIDER_SITE_OTHER): Payer: Medicare HMO

## 2020-12-27 DIAGNOSIS — Z794 Long term (current) use of insulin: Secondary | ICD-10-CM | POA: Diagnosis not present

## 2020-12-27 DIAGNOSIS — E11628 Type 2 diabetes mellitus with other skin complications: Secondary | ICD-10-CM

## 2020-12-27 DIAGNOSIS — E782 Mixed hyperlipidemia: Secondary | ICD-10-CM | POA: Diagnosis not present

## 2020-12-27 DIAGNOSIS — I10 Essential (primary) hypertension: Secondary | ICD-10-CM | POA: Diagnosis not present

## 2020-12-27 DIAGNOSIS — E1169 Type 2 diabetes mellitus with other specified complication: Secondary | ICD-10-CM | POA: Diagnosis not present

## 2020-12-27 DIAGNOSIS — E1142 Type 2 diabetes mellitus with diabetic polyneuropathy: Secondary | ICD-10-CM | POA: Diagnosis not present

## 2020-12-27 NOTE — Progress Notes (Signed)
Chronic Care Management Pharmacy Note  12/27/2020 Name:  Brandi Velazquez MRN:  [id number] DOB:  1961/04/18   Plan Recommendations:   Patient has not been checking blood sugar regularly. She has been busy with brother in the hospital. She will begin Ozempic 0.5 mg weekly this Saturday 04/16. Consider increasing to Ozempic 1 mg weekly at chronic fasting visit with Dr. Tobie Poet if well tolerated.   Patient will begin packaging for medications ~01/09/2021.    Subjective: Brandi Velazquez is an 60 y.o. year old female who is a primary patient of [name], Kirsten, MD.  The CCM team was consulted for assistance with disease management and care coordination needs.    Engaged with patient by telephone for follow up visit in response to provider referral for pharmacy case management and/or care coordination services.   Consent to Services:  The patient was given information about Chronic Care Management services, agreed to services, and gave verbal consent prior to initiation of services.  Please see initial visit note for detailed documentation.   Patient Care Team: [name], MD as PCP - General (Family Medicine) Burnice Logan, [facility] as Pharmacist (Pharmacist)  Recent office visits: 11/21/2020 - cough - azithromycin, benzonotate and duoneb.  10/27/2020 -  Continue current medications.   Recent consult visits: None since last visit  Hospital visits: None in previous 6 months  Objective:  Lab Results  Component Value Date   CREATININE 0.72 10/27/2020   BUN 16 10/27/2020   GFRNONAA 91 10/27/2020   GFRAA 105 10/27/2020   NA 141 10/27/2020   K 4.6 10/27/2020   CALCIUM 9.7 10/27/2020   CO2 23 10/27/2020   GLUCOSE 93 10/27/2020    Lab Results  Component Value Date/Time   HGBA1C 8.0 (H) 10/27/2020 10:43 AM   HGBA1C 6.7 (H) 07/18/2020 10:51 AM   MICROALBUR 30 07/24/2020 11:45 PM    Last diabetic Eye exam:  Lab Results  Component Value Date/Time   HMDIABEYEEXA No Retinopathy  03/07/2020 12:00 AM    Last diabetic Foot exam: No results found for: HMDIABFOOTEX   Lab Results  Component Value Date   CHOL 159 10/27/2020   HDL 63 10/27/2020   LDLCALC 82 10/27/2020   TRIG 75 10/27/2020   CHOLHDL 2.5 10/27/2020    Hepatic Function Latest Ref Rng & Units 10/27/2020 07/18/2020 04/15/2020  Total Protein 6.0 - 8.5 g/dL 6.7 7.2 6.6  Albumin 3.8 - 4.9 g/dL 4.3 4.4 4.2  AST 0 - 40 IU/L 14 14 15   ALT 0 - 32 IU/L 20 17 17   Alk Phosphatase 44 - 121 IU/L 55 57 61  Total Bilirubin 0.0 - 1.2 mg/dL 0.3 0.2 0.2    No results found for: TSH, FREET4  CBC Latest Ref Rng & Units 10/27/2020 07/18/2020 04/15/2020  WBC 3.4 - 10.8 x10E3/uL 13.0(H) 9.1 10.4  Hemoglobin 11.1 - 15.9 g/dL 14.9 13.8 14.0  Hematocrit 34.0 - 46.6 % 45.6 43.6 43.5  Platelets 150 - 450 x10E3/uL 252 230 234    No results found for: VD25OH  Clinical ASCVD: No  The 10-year ASCVD risk score Mikey Bussing DC Jr., et al., 2013) is: 8.6%   Values used to calculate the score:     Age: 60 years     Sex: Female     Is Non-Hispanic African American: No     Diabetic: Yes     Tobacco smoker: No     Systolic Blood Pressure: 643 mmHg     Is BP treated: Yes  HDL Cholesterol: 63 mg/dL     Total Cholesterol: 159 mg/dL    Depression screen [facility] 2/9 07/18/2020 07/13/2016  Decreased Interest 0 0  Down, Depressed, Hopeless 0 0  PHQ - 2 Score 0 0     Social History   Tobacco Use  Smoking Status Former Smoker  . Quit date: 03/20/2009  . Years since quitting: 11.7  Smokeless Tobacco Never Used   BP Readings from Last 3 Encounters:  11/21/20 (!) 148/88  10/27/20 132/80  10/13/20 126/68   Pulse Readings from Last 3 Encounters:  11/21/20 98  10/27/20 89  10/13/20 78   Wt Readings from Last 3 Encounters:  11/21/20 260 lb (117.9 kg)  10/27/20 266 lb (120.7 kg)  07/18/20 265 lb (120.2 kg)   BMI Readings from Last 3 Encounters:  11/21/20 46.06 kg/m  10/27/20 47.12 kg/m  10/13/20 46.94 kg/m     Assessment/Interventions: Review of patient past medical history, allergies, medications, health status, including review of consultants reports, laboratory and other test data, was performed as part of comprehensive evaluation and provision of chronic care management services.   SDOH:  (Social Determinants of Health) assessments and interventions performed: Yes   CCM Care Plan  Allergies  Allergen Reactions  . Bactrim [Sulfamethoxazole-Trimethoprim] Swelling  . Morphine And Related     Medications Reviewed Today    Reviewed by Burnice Logan, [facility] (Pharmacist) on 12/08/20 at 1253  Med List Status: <None>  Medication Order Taking? Sig Documenting Provider Last Dose Status Informant  albuterol (VENTOLIN HFA) 108 (90 Base) MCG/ACT inhaler [id number] Yes Inhale 2 puffs into the lungs every 6 (six) hours as needed for wheezing or shortness of breath. [name], Kirsten, MD Taking Active   azithromycin (ZITHROMAX) 250 MG tablet [id number] No Take two tablets by mouth on day one, take one tablet by mouth days two-five  Patient not taking: Reported on 12/07/2020   Rip Harbour, NP Not Taking Consider Medication Status and Discontinue   benzonatate (TESSALON) 200 MG capsule [id number] No Take 1 capsule (200 mg total) by mouth 3 (three) times daily as needed for cough.  Patient not taking: Reported on 12/07/2020   Rip Harbour, NP Not Taking Active   glimepiride (AMARYL) 4 MG tablet [id number] Yes Take 2 tablets (8 mg total) by mouth daily with breakfast. [name], Kirsten, MD Taking Active   glucose blood (RELION GLUCOSE TEST STRIPS) test strip [id number] Yes Use as instructed [name], [name], MD Taking Active   ibuprofen (ADVIL) 800 MG tablet [id number] Yes Take 1 tablet (800 mg total) by mouth every 8 (eight) hours as needed. [name], Kirsten, MD Taking Active   insulin degludec (TRESIBA FLEXTOUCH) 200 UNIT/ML FlexTouch Pen [id number] Yes Inject 30 Units into the skin daily. [name], Kirsten, MD Taking Active    ipratropium-albuterol (DUONEB) 0.5-2.5 (3) MG/3ML nebulizer solution 3 mL [id number]   Rip Harbour, NP  Active   metFORMIN (GLUCOPHAGE) 1000 MG tablet [id number] Yes Take 1 tablet (1,000 mg total) by mouth 2 (two) times daily with a meal. [name], Kirsten, MD Taking Active   Multiple Vitamin (MULTIVITAMIN) tablet [id number] Yes Take 1 tablet by mouth daily. [provider] Taking Active   pioglitazone (ACTOS) 30 MG tablet [id number] Yes Take 1 tablet (30 mg total) by mouth daily. [name], Kirsten, MD Taking Active   pravastatin (PRAVACHOL) 40 MG tablet [id number] Yes Take 1 tablet (40 mg total) by mouth at bedtime. [name], Kirsten, MD Taking Active   ramipril (ALTACE) 2.5 MG capsule [id number]  Yes Take 1 capsule (2.5 mg total) by mouth daily. [name], Kirsten, MD Taking Active   Semaglutide,0.25 or 0.5MG/DOS, (OZEMPIC, 0.25 OR 0.5 MG/DOSE,) 2 MG/1.5ML SOPN [id number] Yes Inject 0.5 mg into the skin once a week.  Patient taking differently: Inject 0.5 mg into the skin once a week. 0.25 mg weekly for 4 weeks then increase to 0.5 mg weekly   [name], Kirsten, MD Taking Active   spironolactone (ALDACTONE) 25 MG tablet [id number] Yes Take 1 tablet (25 mg total) by mouth daily. [name], Kirsten, MD Taking Active   traMADol Veatrice Bourbon) 50 MG tablet [id number] Yes Take 1 - 2 tablets every 6 hours as needed for pain [provider] Taking Active           Patient Active Problem List   Diagnosis Date Noted  . Morbid obesity with BMI of 45.0-49.9, adult ([place]) 10/30/2020  . Diabetes mellitus ([place]) 04/17/2020  . Hyperlipidemia   . Essential hypertension   . GERD (gastroesophageal reflux disease)   . Chronic midline low back pain without sciatica 01/10/2020  . Back pain of lumbar region with sciatica 01/07/2020  . Other acute recurrent sinusitis 10/29/2019  . Cough 10/29/2019  . Varicose veins of lower extremities with other complications 91/50/5697  . Varicose veins of lower extremities with inflammation  06/14/2011    Immunization History  Administered Date(s) Administered  . Influenza Inj Mdck Quad Pf 07/18/2020  . Moderna Sars-Covid-2 Vaccination 10/07/2019, 11/02/2019  . Pneumococcal Polysaccharide-23 10/09/2011    Conditions to be addressed/monitored:  Hyperlipidemia and Diabetes  There are no care plans that you recently modified to display for this patient.    Medication Assistance: [name] and Ozempicobtained through [employer] medication assistance program.  Enrollment ends 08/16/2021  Patient's preferred pharmacy is:  [facility] [place], [place] [address] Phone: [phone] Fax: [phone]  [facility] - [place], [place] - [address] [place] [address] Phone: [phone] Fax: [phone]  Uses pill box? Yes Pt endorses good compliance  We discussed: Benefits of medication synchronization, packaging and delivery as well as enhanced pharmacist oversight with [facility]. Patient decided to: Utilize [facility] pharmacy for medication synchronization, packaging and delivery  Care Plan and Follow Up Patient Decision:  Patient agrees to Care Plan and Follow-up.  Plan: Telephone follow up appointment with care management team member scheduled for:  03/28/2021

## 2020-12-27 NOTE — Patient Instructions (Addendum)
Visit Information  Goals Addressed            This Visit's Progress   . Learn More About My Health   On track    Timeframe:  Long-Range Goal Priority:  High Start Date:                             Expected End Date:                        Follow Up Date 12/27/2020   - tell my story and reason for my visit - repeat what I heard to make sure I understand - bring a list of my medicines to the visit - speak up when I don't understand    Why is this important?    The best way to learn about your health and care is by talking to the doctor and nurse.   They will answer your questions and give you information in the way that you like best.    Notes:     Marland Kitchen Monitor and Manage My Blood Sugar-Diabetes Type 2   On track    Timeframe:  Long-Range Goal Priority:  High Start Date:                             Expected End Date:                       Follow Up Date 12/27/2020    - check blood sugar at prescribed times - take the blood sugar log to all doctor visits    Why is this important?    Checking your blood sugar at home helps to keep it from getting very high or very low.   Writing the results in a diary or log helps the doctor know how to care for you.   Your blood sugar log should have the time, date and the results.   Also, write down the amount of insulin or other medicine that you take.   Other information, like what you ate, exercise done and how you were feeling, will also be helpful.     Notes:     . Set My Target A1C-Diabetes Type 2   On track    Timeframe:  Long-Range Goal Priority:  High Start Date:                             Expected End Date:                       Follow Up Date 12/27/2020    - set target A1C    Why is this important?    Your target A1C is decided together by you and your doctor.   It is based on several things like your age and other health issues.    Notes:       Patient Care Plan: CCM Pharmacy Care Plan    Problem  Identified: htn, hld, dm   Priority: High  Onset Date: 12/07/2020    Long-Range Goal: Disease Management   Start Date: 12/07/2020  Expected End Date: 12/08/2021  Recent Progress: On track  Priority: High  Note:   Current Barriers:  . Unable to independently afford treatment regimen . Unable to achieve control of  blood sugar    Pharmacist Clinical Goal(s):  Marland Kitchen Patient will verbalize ability to afford treatment regimen . achieve control of diabetes as evidenced by blood sugar through collaboration with PharmD and provider.   Interventions: . 1:1 collaboration with [name], MD regarding development and update of comprehensive plan of care as evidenced by provider attestation and co-signature . Inter-disciplinary care team collaboration (see longitudinal plan of care) . Comprehensive medication review performed; medication list updated in electronic medical record  Hypertension (BP goal <130/80) -Controlled -Current treatment: . ramipril 2.5 mg daily  . Spironolactone 25 mg daily  -Medications previously tried: none reported  -Current home readings: well controlled per patient  -Current dietary habits: has had very little appetite since recovering from pneumonia -Current exercise habits: limited due to mobility -Denies hypotensive/hypertensive symptoms -Educated on BP goals and benefits of medications for prevention of heart attack, stroke and kidney damage; Daily salt intake goal < 2300 mg; Exercise goal of 150 minutes per week; -Counseled to monitor BP at home weekly, document, and provide log at future appointments -Counseled on diet and exercise extensively and encouraged to resume PT.  Recommended to continue current medication  Hyperlipidemia: (LDL goal < 70) -Not ideally controlled -Current treatment: . pravastatin 40 mg at bedtime  -Medications previously tried: none reported  -Current dietary patterns: limited appetite lately since pneumonia -Current exercise  habits: limited due to mobility -Educated on Cholesterol goals;  Benefits of statin for ASCVD risk reduction; Importance of limiting foods high in cholesterol; Exercise goal of 150 minutes per week; -Counseled on diet and exercise extensively Recommended to continue current medication and consider change in statin after next blood work if LDL above goal.   Diabetes (A1c goal <7%) -Not ideally controlled -Current medications: . Ozmepic 0.5 mg weekly for 4 weeks then 1 mg weekly if tolerating well . Tresiba 30 units  . Actos 30 mg daily  . Glimepiride 4 mg daily with breakfast . [facility] testing supplies  . Metformin 1000 mg bid  -Medications previously tried: 70/30 insulin   -Current home glucose readings . fasting glucose: 162 mg/dL  -Denies hypoglycemic/hyperglycemic symptoms -Current meal patterns:  . Patient doesn't snack. Eats 2-3 meals daily. States that her appetite is reduced since Ozempic and she has lost 2 lbs.  -Current exercise: limited due to mobility and recovering from pneumonia. She has not resume PT since brother is in the hospital.  -Educated on A1c and blood sugar goals; Complications of diabetes including kidney damage, retinal damage, and cardiovascular disease; Exercise goal of 150 minutes per week; Benefits of weight loss; Benefits of routine self-monitoring of blood sugar; Carbohydrate counting and/or plate method -Counseled to check feet daily and get yearly eye exams -Counseled on diet and exercise extensively Recommended to increase Ozempic dose to 0.5 mg week this week. Will consider increasing to 1 mg weekly if tolerating well after 4 weeks. Patient sees Dr. Tobie Poet in 1 month for fasting blood work.    Patient Goals/Self-Care Activities . Patient will:  - take medications as prescribed focus on medication adherence by using pill box check glucose daily, document, and provide at future appointments target a minimum of 150 minutes of moderate intensity  exercise weekly engage in dietary modifications by limiting carbohydrates and balancing meals using plate method.   Follow Up Plan: Telephone follow up appointment with care management team member scheduled for: 03/2021      The patient verbalized understanding of instructions, educational materials, and care plan provided today and declined  offer to receive copy of patient instructions, educational materials, and care plan.    Telephone follow up appointment with pharmacy team member scheduled for: 03/2021  Burnice Logan, [facility]  Blood Glucose Monitoring, Adult Monitoring your blood sugar (glucose) is an important part of managing your diabetes. Blood glucose monitoring involves checking your blood glucose as often as directed and keeping a log or record of your results over time. Checking your blood glucose regularly and keeping a blood glucose log can:  Help you and your health care provider adjust your diabetes management plan as needed, including your medicines or insulin.  Help you understand how food, exercise, illnesses, and medicines affect your blood glucose.  Let you know what your blood glucose is at any time. You can quickly find out if you have low blood glucose (hypoglycemia) or high blood glucose (hyperglycemia). Your health care provider will set individualized treatment goals for you. Your goals will be based on your age, other medical conditions you have, and how you respond to diabetes treatment. Generally, the goal of treatment is to maintain the following blood glucose levels:  Before meals (preprandial): 80-130 mg/dL (4.4-7.2 mmol/L).  After meals (postprandial): below 180 mg/dL (10 mmol/L).  A1C level: less than 7%. Supplies needed:  Blood glucose meter.  Test strips for your meter. Each meter has its own strips. You must use the strips that came with your meter.  A needle to prick your finger (lancet). Do not use a lancet more than one time.  A device that  holds the lancet (lancing device).  A journal or log book to write down your results. How to check your blood glucose Checking your blood glucose 1. Wash your hands for at least 20 seconds with soap and water. 2. Prick the side of your finger (not the tip) with the lancet. Do not use the same finger consecutively. 3. Gently rub the finger until a small drop of blood appears. 4. Follow instructions that come with your meter for inserting the test strip, applying blood to the strip, and using your blood glucose meter. 5. Write down your result and any notes in your log.   Using alternative sites Some meters allow you to use areas of your body other than your finger (alternative sites) to test your blood. The most common alternative sites are the forearm, the thigh, and the palm of your hand. Alternative sites may not be as accurate as the fingers because blood flow is slower in those areas. This means that the result you get may be delayed, and it may be different from the result that you would get from your finger. Use the finger only, and do not use alternative sites, if:  You think you have hypoglycemia.  You sometimes do not know that your blood glucose is getting low (hypoglycemia unawareness). General tips and recommendations Blood glucose log  Every time you check your blood glucose, write down your result. Also write down any notes about things that may be affecting your blood glucose, such as your diet and exercise for the day. This information can help you and your health care provider: ? Look for patterns in your blood glucose over time. ? Adjust your diabetes management plan as needed.  Check if your meter allows you to download your records to a computer or if there is an app for the meter. Most glucose meters store a record of glucose readings in the meter.   If you have type 1 diabetes:  Check your blood glucose 4 or more times a day if you are on intensive insulin therapy  with multiple daily injections (MDI) or if you are using an insulin pump. Check your blood glucose: ? Before every meal and snack. ? Before bedtime.  Also check your blood glucose: ? If you have symptoms of hypoglycemia. ? After treating low blood glucose. ? Before doing activities that create a risk for injury, like driving or using machinery. ? Before and after exercise. ? Two hours after a meal. ? Occasionally between 2:00 a.m. and 3:00 a.m., as directed.  You may need to check your blood glucose more often, 6-10 times per day, if: ? You have diabetes that is not well controlled. ? You are ill. ? You have a history of severe hypoglycemia. ? You have hypoglycemia unawareness. If you have type 2 diabetes:  Check your blood glucose 2 or more times a day if you take insulin or other diabetes medicines.  Check your blood glucose 4 or more times a day if you are on intensive insulin therapy. Occasionally, you may also need to check your glucose between 2:00 a.m. and 3:00 a.m., as directed.  Also check your blood glucose: ? Before and after exercise. ? Before doing activities that create a risk for injury, like driving or using machinery.  You may need to check your blood glucose more often if: ? Your medicine is being adjusted. ? Your diabetes is not well controlled. ? You are ill. General tips  Make sure you always have your supplies with you.  After you use a few boxes of test strips, adjust (calibrate) your blood glucose meter by following instructions that came with your meter.  If you have questions or need help, all blood glucose meters have a 24-hour hotline phone number available that you can call. Also contact your health care provider with questions or concerns you may have. Where to find more information  The American Diabetes Association: www.diabetes.org  The Association of Diabetes Care & Education Specialists: www.diabeteseducator.org Contact a health care  provider if:  Your blood glucose is at or above 240 mg/dL (13.3 mmol/L) for 2 days in a row.  You have been sick or have had a fever for 2 days or longer, and you are not getting better.  You have any of the following problems for more than 6 hours: ? You cannot eat or drink. ? You have nausea or vomiting. ? You have diarrhea. Get help right away if:  Your blood glucose is lower than 54 mg/dL (3 mmol/L).  You become confused, or you have trouble thinking clearly.  You have difficulty breathing.  You have moderate or large ketone levels in your urine. These symptoms may represent a serious problem that is an emergency. Do not wait to see if the symptoms will go away. Get medical help right away. Call your local emergency services (911 in the [place] Do not drive yourself to the hospital. Summary  Monitoring your blood glucose is an important part of managing your diabetes.  Blood glucose monitoring involves checking your blood glucose as often as directed and keeping a log or record of your results over time.  Your health care provider will set individualized treatment goals for you. Your goals will be based on your age, other medical conditions you have, and how you respond to diabetes treatment.  Every time you check your blood glucose, write down your result. Also, write down any notes about things that may  be affecting your blood glucose, such as your diet and exercise for the day. This information is not intended to replace advice given to you by your health care provider. Make sure you discuss any questions you have with your health care provider. Document Revised: 06/01/2020 Document Reviewed: 06/01/2020 Elsevier Patient Education  2021 [employer].

## 2020-12-28 ENCOUNTER — Telehealth: Payer: Self-pay

## 2020-12-28 NOTE — Progress Notes (Signed)
Chronic Care Management Pharmacy Assistant   Name: Brandi Velazquez  MRN: [id number] DOB: 1961-07-06  Reason for Encounter: Medication coordination for [facility] delivery  Recent office visits:   12/27/20-Sara Owens Shark, CPP-She will begin Ozempic 0.5 mg weekly this Saturday 04/16. Consider increasing to Ozempic 1 mg weekly at chronic fasting visit with Dr. Tobie Poet if well tolerated. Follow up 03/28/21   12/07/20-Sara Owens Shark, [place], Patient reports that blood sugar has been elevated since taking steroids for pneumonia. She is going to continue Ozempic 0.25 mg weekly for 3 more doses. We will speak before increasing dose to evaluate symptoms and blood sugar readings.         Patient had to postpone PT due to pneumonia but hopes to resume.   Recent consult visits:  none  Hospital visits:  None in previous 6 months    Medications: Outpatient Encounter Medications as of 12/28/2020  Medication Sig  . albuterol (VENTOLIN HFA) 108 (90 Base) MCG/ACT inhaler Inhale 2 puffs into the lungs every 6 (six) hours as needed for wheezing or shortness of breath.  Marland Kitchen glimepiride (AMARYL) 4 MG tablet Take 2 tablets (8 mg total) by mouth daily with breakfast.  . glucose blood (RELION GLUCOSE TEST STRIPS) test strip Use as instructed  . ibuprofen (ADVIL) 800 MG tablet Take 1 tablet (800 mg total) by mouth every 8 (eight) hours as needed.  . insulin degludec (TRESIBA FLEXTOUCH) 200 UNIT/ML FlexTouch Pen Inject 30 Units into the skin daily.  . metFORMIN (GLUCOPHAGE) 1000 MG tablet Take 1 tablet (1,000 mg total) by mouth 2 (two) times daily with a meal.  . Multiple Vitamin (MULTIVITAMIN) tablet Take 1 tablet by mouth daily.  . pioglitazone (ACTOS) 30 MG tablet Take 1 tablet (30 mg total) by mouth daily.  . pravastatin (PRAVACHOL) 40 MG tablet Take 1 tablet (40 mg total) by mouth at bedtime.  . ramipril (ALTACE) 2.5 MG capsule Take 1 capsule (2.5 mg total) by mouth daily.  . Semaglutide,0.25 or 0.5MG /DOS, (OZEMPIC, 0.25  OR 0.5 MG/DOSE,) 2 MG/1.5ML SOPN Inject 0.5 mg into the skin once a week. (Patient taking differently: Inject 0.5 mg into the skin once a week. Increasing to 0.5 mg weekly this week.)  . spironolactone (ALDACTONE) 25 MG tablet Take 1 tablet (25 mg total) by mouth daily.  . traMADol (ULTRAM) 50 MG tablet Take 1 - 2 tablets every 6 hours as needed for pain   Facility-Administered Encounter Medications as of 12/28/2020  Medication  . ipratropium-albuterol (DUONEB) 0.5-2.5 (3) MG/3ML nebulizer solution 3 mL    Reviewed chart for medication changes ahead of medication coordination call.  No OVs, Consults, or hospital visits since last care coordination call/Pharmacist visit. (If appropriate, list visit date, provider name)  No medication changes indicated OR if recent visit, treatment plan here.  BP Readings from Last 3 Encounters:  11/21/20 (!) 148/88  10/27/20 132/80  10/13/20 126/68    Lab Results  Component Value Date   HGBA1C 8.0 (H) 10/27/2020     Patient obtains medications through [facility]  30 Days she wants to compare pricing difference from 30ds to 90ds  Last adherence delivery included: This will be her first delivery  Patient declined (meds) last month due to PRN use/additional supply on hand. None  Patient is due for next adherence delivery on: 01/09/21  Called patient and reviewed medications and coordinated delivery.  This delivery to include: Pravastatin 40mg     bedtime Glimepiride 4mg        2  tabs before breakfast Pioglitazone 30mg     Before breakfast  Patient declined the following medications (meds) due to (reason) Spironolactone    Recent fill Ramipril                Recent fill Metformin            Recent fill  Patient needs refills for:  None  Confirmed delivery date of 01/09/21, advised patient that pharmacy will contact them the morning of delivery.  I have submitted form for approval   Clarita Leber, [place] Pharmacist  Assistant [phone]

## 2021-01-27 ENCOUNTER — Telehealth: Payer: Self-pay

## 2021-01-27 NOTE — Progress Notes (Signed)
Monthly delivery for [facility].  After the acute form was completed, [facility] sent a Med sync notification.    Recent office visits:   12/27/20-Brandi Velazquez, CPP-She will begin Ozempic 0.5 mg weekly this Saturday 04/16. Consider increasing to Ozempic 1 mg weekly at chronic fasting visit with Dr. Tobie Poet if well tolerated. Follow up 03/28/21   12/07/20-Brandi Velazquez, [place], Patient reports that blood sugar has been elevated since taking steroids for pneumonia. She is going to continue Ozempic 0.25 mg weekly for 3 more doses. We will speak before increasing dose to evaluate symptoms and blood sugar readings.         Patient had to postpone PT due to pneumonia but hopes to resume.  01/09/21 delivery included Pravastatin 40mg     bedtime Glimepiride 4mg        2 tabs before breakfast Pioglitazone 30mg     Before breakfast  Patient declined the following medications last time Spironolactone    Recent fill Ramipril                Recent fill Metformin            Recent fill  Patient declined this time Ramipril-got recent fill with previous pharmacy  No refills needed.  Delivery date is 02/08/21  Clarita Leber, [place] Pharmacist Assistant [phone]

## 2021-01-27 NOTE — Progress Notes (Signed)
    Chronic Care Management Pharmacy Assistant   Name: Brandi Velazquez  MRN: [id number] DOB: [birth year]   Reason for Encounter: Medication coordination for [facility] acute delivery     Medications: Outpatient Encounter Medications as of 01/27/2021  Medication Sig  . albuterol (VENTOLIN HFA) 108 (90 Base) MCG/ACT inhaler Inhale 2 puffs into the lungs every 6 (six) hours as needed for wheezing or shortness of breath.  Marland Kitchen glimepiride (AMARYL) 4 MG tablet Take 2 tablets (8 mg total) by mouth daily with breakfast.  . glucose blood (RELION GLUCOSE TEST STRIPS) test strip Use as instructed  . ibuprofen (ADVIL) 800 MG tablet Take 1 tablet (800 mg total) by mouth every 8 (eight) hours as needed.  . insulin degludec (TRESIBA FLEXTOUCH) 200 UNIT/ML FlexTouch Pen Inject 30 Units into the skin daily.  . metFORMIN (GLUCOPHAGE) 1000 MG tablet Take 1 tablet (1,000 mg total) by mouth 2 (two) times daily with a meal.  . Multiple Vitamin (MULTIVITAMIN) tablet Take 1 tablet by mouth daily.  . pioglitazone (ACTOS) 30 MG tablet Take 1 tablet (30 mg total) by mouth daily.  . pravastatin (PRAVACHOL) 40 MG tablet Take 1 tablet (40 mg total) by mouth at bedtime.  . ramipril (ALTACE) 2.5 MG capsule Take 1 capsule (2.5 mg total) by mouth daily.  . Semaglutide,0.25 or 0.5MG /DOS, (OZEMPIC, 0.25 OR 0.5 MG/DOSE,) 2 MG/1.5ML SOPN Inject 0.5 mg into the skin once a week. (Patient taking differently: Inject 0.5 mg into the skin once a week. Increasing to 0.5 mg weekly this week.)  . spironolactone (ALDACTONE) 25 MG tablet Take 1 tablet (25 mg total) by mouth daily.  . traMADol (ULTRAM) 50 MG tablet Take 1 - 2 tablets every 6 hours as needed for pain   Facility-Administered Encounter Medications as of 01/27/2021  Medication  . ipratropium-albuterol (DUONEB) 0.5-2.5 (3) MG/3ML nebulizer solution 3 mL    Donette Larry, CPP stated the patient had called her stating she was going to be out of her Metformin by this weekend, she only  had two pills left.  I tried to call the patient to see if there was other medication she was running low on, I had to leave VM for patient.  I informed Donette Larry, CPP, I would proceed with acute form to [facility] for Metformin.  I let Kamara with [facility] know we had a acute form in folder for delivery today  Clarita Leber, [place] Pharmacist Assistant [phone]

## 2021-02-01 ENCOUNTER — Ambulatory Visit: Payer: Medicare HMO | Admitting: Family Medicine

## 2021-02-01 ENCOUNTER — Encounter: Payer: Self-pay | Admitting: Family Medicine

## 2021-02-09 ENCOUNTER — Encounter: Payer: Self-pay | Admitting: Family Medicine

## 2021-02-09 ENCOUNTER — Ambulatory Visit (INDEPENDENT_AMBULATORY_CARE_PROVIDER_SITE_OTHER): Payer: Medicare HMO

## 2021-02-09 ENCOUNTER — Other Ambulatory Visit: Payer: Self-pay

## 2021-02-09 ENCOUNTER — Ambulatory Visit (INDEPENDENT_AMBULATORY_CARE_PROVIDER_SITE_OTHER): Payer: Medicare HMO | Admitting: Family Medicine

## 2021-02-09 VITALS — BP 112/64 | HR 84 | Temp 97.5°F | Resp 18 | Ht 63.0 in | Wt 255.0 lb

## 2021-02-09 DIAGNOSIS — M545 Low back pain, unspecified: Secondary | ICD-10-CM

## 2021-02-09 DIAGNOSIS — E782 Mixed hyperlipidemia: Secondary | ICD-10-CM | POA: Diagnosis not present

## 2021-02-09 DIAGNOSIS — Z6841 Body Mass Index (BMI) 40.0 and over, adult: Secondary | ICD-10-CM | POA: Diagnosis not present

## 2021-02-09 DIAGNOSIS — E1142 Type 2 diabetes mellitus with diabetic polyneuropathy: Secondary | ICD-10-CM | POA: Diagnosis not present

## 2021-02-09 DIAGNOSIS — Z23 Encounter for immunization: Secondary | ICD-10-CM

## 2021-02-09 DIAGNOSIS — I83899 Varicose veins of unspecified lower extremities with other complications: Secondary | ICD-10-CM

## 2021-02-09 DIAGNOSIS — G8929 Other chronic pain: Secondary | ICD-10-CM | POA: Diagnosis not present

## 2021-02-09 NOTE — Progress Notes (Signed)
Subjective:  Patient ID: Brandi Velazquez, female    DOB: 1961-02-15  Age: 60 y.o. MRN: [id number]  Chief Complaint  Patient presents with  . Diabetes  . Hyperlipidemia  . Hypertension    HPI Diabetes with neuropathy: glimeperide 4 mg one twice a day, Tresiba 30 U daily, Metformin 1000 mg one twice a day, Ozempic 0.5 mg once weekly, actos 30 mg once daily. 1-2 times per week is checking sugars. Cannot remember what her sugars are. Pt had pneumonia and was on prednisone. She is concerned her A1C will be high. She thinks they are running 100 - 150.   Hyperlipidemia: On pravastatin   Lumbar back pain: unable to continue PT due to caring for sick brother. Came home from [place] 2-3 weeks ago. Home health care nursing is coming to home and they say he is improving. He has advanced liver disease complicated by liver cancer. Had hepatitis C and received treatment. He is on transplant list.    Current Outpatient Medications on File Prior to Visit  Medication Sig Dispense Refill  . albuterol (VENTOLIN HFA) 108 (90 Base) MCG/ACT inhaler Inhale 2 puffs into the lungs every 6 (six) hours as needed for wheezing or shortness of breath. 8 g 2  . glimepiride (AMARYL) 4 MG tablet Take 2 tablets (8 mg total) by mouth daily with breakfast. 90 tablet 1  . glucose blood (RELION GLUCOSE TEST STRIPS) test strip Use as instructed 100 each 2  . ibuprofen (ADVIL) 800 MG tablet Take 1 tablet (800 mg total) by mouth every 8 (eight) hours as needed. 30 tablet 0  . insulin degludec (TRESIBA FLEXTOUCH) 200 UNIT/ML FlexTouch Pen Inject 30 Units into the skin daily. 15 mL 0  . metFORMIN (GLUCOPHAGE) 1000 MG tablet Take 1 tablet (1,000 mg total) by mouth 2 (two) times daily with a meal. 180 tablet 1  . Multiple Vitamin (MULTIVITAMIN) tablet Take 1 tablet by mouth daily.    . pioglitazone (ACTOS) 30 MG tablet Take 1 tablet (30 mg total) by mouth daily. 90 tablet 1  . pravastatin (PRAVACHOL) 40 MG tablet Take 1 tablet (40  mg total) by mouth at bedtime. 90 tablet 1  . ramipril (ALTACE) 2.5 MG capsule Take 1 capsule (2.5 mg total) by mouth daily. 90 capsule 1  . Semaglutide,0.25 or 0.5MG /DOS, (OZEMPIC, 0.25 OR 0.5 MG/DOSE,) 2 MG/1.5ML SOPN Inject 0.5 mg into the skin once a week. (Patient taking differently: Inject 0.5 mg into the skin once a week. Increasing to 0.5 mg weekly this week.) 1.5 mL 2  . spironolactone (ALDACTONE) 25 MG tablet Take 1 tablet (25 mg total) by mouth daily. 90 tablet 1  . traMADol (ULTRAM) 50 MG tablet Take 1 - 2 tablets every 6 hours as needed for pain     Current Facility-Administered Medications on File Prior to Visit  Medication Dose Route Frequency Provider Last Rate Last Admin  . ipratropium-albuterol (DUONEB) 0.5-2.5 (3) MG/3ML nebulizer solution 3 mL  3 mL Nebulization Once Rip Harbour, NP       Past Medical History:  Diagnosis Date  . Ankle pain   . Chronic pain    back  . COPD (chronic obstructive pulmonary disease) ([place])   . Depression   . Diabetes mellitus   . GERD (gastroesophageal reflux disease)   . Hyperlipidemia   . Hypertension   . Low back pain   . Obstructive sleep apnea   . Pneumonia 2010  . Post-menopausal atrophic vaginitis   .  Superficial phlebitis    Past Surgical History:  Procedure Laterality Date  . BACK SURGERY  1990  . CESAREAN SECTION    . CYST REMOVAL HAND     inner thigh  . SPINE SURGERY     lumbar laminectomy and diskectomy    Family History  Problem Relation Age of Onset  . Diabetes Mother   . Heart disease Mother   . Cancer Father   . Stroke Sister    Social History   Socioeconomic History  . Marital status: Single    Spouse name: Not on file  . Number of children: Not on file  . Years of education: Not on file  . Highest education level: Not on file  Occupational History  . Not on file  Tobacco Use  . Smoking status: Former Smoker    Quit date: 03/20/2009    Years since quitting: 11.9  . Smokeless tobacco: Never  Used  Substance and Sexual Activity  . Alcohol use: No  . Drug use: No  . Sexual activity: Not on file  Other Topics Concern  . Not on file  Social History Narrative  . Not on file   Social Determinants of Health   Financial Resource Strain: Not on file  Food Insecurity: Unknown  . Worried About Charity fundraiser in the Last Year: Not on file  . Ran Out of Food in the Last Year: Never true  Transportation Needs: No Transportation Needs  . Lack of Transportation (Medical): No  . Lack of Transportation (Non-Medical): No  Physical Activity: Not on file  Stress: Not on file  Social Connections: Not on file    Review of Systems  Constitutional: Negative for chills, fatigue and fever.  HENT: Negative for congestion, rhinorrhea and sore throat.   Respiratory: Positive for cough. Negative for shortness of breath.   Cardiovascular: Negative for chest pain.  Gastrointestinal: Positive for nausea. Negative for abdominal pain, constipation, diarrhea and vomiting.  Genitourinary: Negative for dysuria and urgency.  Musculoskeletal: Positive for arthralgias, back pain and myalgias.  Neurological: Negative for dizziness, weakness, light-headedness and headaches.  Psychiatric/Behavioral: Negative for dysphoric mood. The patient is not nervous/anxious.      Objective:  BP 112/64   Pulse 84   Temp (!) 97.5 F (36.4 C)   Resp 18   Ht 5\' 3"  (1.6 m)   Wt 255 lb (115.7 kg)   BMI 45.17 kg/m   BP/Weight 02/09/2021 11/21/2020 04/11/3663  Systolic BP 403 474 259  Diastolic BP 64 88 80  Wt. (Lbs) 255 260 266  BMI 45.17 46.06 47.12    Physical Exam Vitals reviewed.  Constitutional:      Appearance: Normal appearance. She is normal weight.  Neck:     Vascular: No carotid bruit.  Cardiovascular:     Rate and Rhythm: Normal rate and regular rhythm.     Pulses: Normal pulses.     Heart sounds: Normal heart sounds.  Pulmonary:     Effort: Pulmonary effort is normal. No respiratory  distress.     Breath sounds: Normal breath sounds.  Abdominal:     General: Abdomen is flat. Bowel sounds are normal.     Palpations: Abdomen is soft.     Tenderness: There is no abdominal tenderness.  Skin:    Comments: Pronounced varicosities -large and spider.  Neurological:     Mental Status: She is alert and oriented to person, place, and time.  Psychiatric:  Mood and Affect: Mood normal.        Behavior: Behavior normal.     Diabetic Foot Exam - Simple   Simple Foot Form Diabetic Foot exam was performed with the following findings: Yes 02/09/2021  5:33 PM  Visual Inspection No deformities, no ulcerations, no other skin breakdown bilaterally: Yes Sensation Testing See comments: Yes Pulse Check Posterior Tibialis and Dorsalis pulse intact bilaterally: Yes Comments Decreased sensation BL feet       Lab Results  Component Value Date   WBC 8.5 02/09/2021   HGB 14.8 02/09/2021   HCT 44.8 02/09/2021   PLT 235 02/09/2021   GLUCOSE 141 (H) 02/09/2021   CHOL 120 02/09/2021   TRIG 75 02/09/2021   HDL 47 02/09/2021   LDLCALC 58 02/09/2021   ALT 21 02/09/2021   AST 13 02/09/2021   NA 143 02/09/2021   K 4.1 02/09/2021   CL 101 02/09/2021   CREATININE 0.74 02/09/2021   BUN 21 02/09/2021   CO2 23 02/09/2021   HGBA1C 8.6 (H) 02/09/2021   MICROALBUR 30 07/24/2020      Assessment & Plan:   1. Diabetic polyneuropathy associated with type 2 diabetes mellitus (HCC) Control: not at goal Recommend check sugars fasting daily. Recommend check feet daily. Recommend annual eye exams. Medicines: Increase ozempic Continue to work on eating a healthy diet and exercise.  Labs drawn today.   - Hemoglobin A1c  2. Mixed hyperlipidemia Well controlled.  No changes to medicines.  Continue to work on eating a healthy diet and exercise.  Labs drawn today.  - CBC with Differential/Platelet - Comprehensive metabolic panel - Lipid panel  3. Morbid obesity with BMI of  45.0-49.9, adult ([place]) Recommend continue to work on eating healthy diet and exercise.  4. Chronic midline low back pain without sciatica Continue tylenol.   5. Bleeding from varicose vein - Ambulatory referral to Vascular Surgery    No orders of the defined types were placed in this encounter.   Orders Placed This Encounter  Procedures  . CBC with Differential/Platelet  . Comprehensive metabolic panel  . Hemoglobin A1c  . Lipid panel  . Cardiovascular Risk Assessment  . Ambulatory referral to Vascular Surgery     Follow-up: Return in about 3 months (around 05/12/2021) for fasting.  An After Visit Summary was printed and given to the patient.  [name], MD [facility] [phone]

## 2021-02-10 LAB — COMPREHENSIVE METABOLIC PANEL
ALT: 21 IU/L (ref 0–32)
AST: 13 IU/L (ref 0–40)
Albumin/Globulin Ratio: 1.8 (ref 1.2–2.2)
Albumin: 4.3 g/dL (ref 3.8–4.9)
Alkaline Phosphatase: 57 IU/L (ref 44–121)
BUN/Creatinine Ratio: 28 (ref 12–28)
BUN: 21 mg/dL (ref 8–27)
Bilirubin Total: 0.5 mg/dL (ref 0.0–1.2)
CO2: 23 mmol/L (ref 20–29)
Calcium: 9.3 mg/dL (ref 8.7–10.3)
Chloride: 101 mmol/L (ref 96–106)
Creatinine, Ser: 0.74 mg/dL (ref 0.57–1.00)
Globulin, Total: 2.4 g/dL (ref 1.5–4.5)
Glucose: 141 mg/dL — ABNORMAL HIGH (ref 65–99)
Potassium: 4.1 mmol/L (ref 3.5–5.2)
Sodium: 143 mmol/L (ref 134–144)
Total Protein: 6.7 g/dL (ref 6.0–8.5)
eGFR: 93 mL/min/{1.73_m2} (ref >59)

## 2021-02-10 LAB — CBC WITH DIFFERENTIAL/PLATELET
Basophils Absolute: 0 10*3/uL (ref 0.0–0.2)
Basos: 0 %
EOS (ABSOLUTE): 0.1 10*3/uL (ref 0.0–0.4)
Eos: 1 %
Hematocrit: 44.8 % (ref 34.0–46.6)
Hemoglobin: 14.8 g/dL (ref 11.1–15.9)
Immature Grans (Abs): 0 10*3/uL (ref 0.0–0.1)
Immature Granulocytes: 0 %
Lymphocytes Absolute: 2.7 10*3/uL (ref 0.7–3.1)
Lymphs: 32 %
MCH: 29 pg (ref 26.6–33.0)
MCHC: 33 g/dL (ref 31.5–35.7)
MCV: 88 fL (ref 79–97)
Monocytes Absolute: 0.7 10*3/uL (ref 0.1–0.9)
Monocytes: 8 %
Neutrophils Absolute: 4.9 10*3/uL (ref 1.4–7.0)
Neutrophils: 59 %
Platelets: 235 10*3/uL (ref 150–450)
RBC: 5.11 x10E6/uL (ref 3.77–5.28)
RDW: 13.5 % (ref 11.7–15.4)
WBC: 8.5 10*3/uL (ref 3.4–10.8)

## 2021-02-10 LAB — HEMOGLOBIN A1C
Est. average glucose Bld gHb Est-mCnc: 200 mg/dL
Hgb A1c MFr Bld: 8.6 % — ABNORMAL HIGH (ref 4.8–5.6)

## 2021-02-10 LAB — LIPID PANEL
Chol/HDL Ratio: 2.6 ratio (ref 0.0–4.4)
Cholesterol, Total: 120 mg/dL (ref 100–199)
HDL: 47 mg/dL (ref >39)
LDL Chol Calc (NIH): 58 mg/dL (ref 0–99)
Triglycerides: 75 mg/dL (ref 0–149)
VLDL Cholesterol Cal: 15 mg/dL (ref 5–40)

## 2021-02-10 LAB — CARDIOVASCULAR RISK ASSESSMENT

## 2021-02-15 ENCOUNTER — Other Ambulatory Visit: Payer: Self-pay | Admitting: Family Medicine

## 2021-02-15 MED ORDER — KERENDIA 10 MG PO TABS: 10.0000 mg | ORAL_TABLET | Freq: Every day | ORAL | 2 refills | Status: DC

## 2021-02-16 ENCOUNTER — Telehealth: Payer: Self-pay

## 2021-02-16 NOTE — Progress Notes (Signed)
    Chronic Care Management Pharmacy Assistant   Name: Brandi Velazquez  MRN: [id number] DOB: [birth year]  Reason for Encounter: Medication adherence call and PAP for [place] patient to let her know patient assistance form has been started and she can go by the office to complete and sign. She is still taking spironolactone and will confirm with Dr. Tobie Poet when she comes by to sign her form if she needs to discontinue it now or when she starts the [place].     Medications: Outpatient Encounter Medications as of 02/16/2021  Medication Sig  . albuterol (VENTOLIN HFA) 108 (90 Base) MCG/ACT inhaler Inhale 2 puffs into the lungs every 6 (six) hours as needed for wheezing or shortness of breath.  . Finerenone (KERENDIA) 10 MG TABS Take 10 mg by mouth daily.  Marland Kitchen glimepiride (AMARYL) 4 MG tablet Take 2 tablets (8 mg total) by mouth daily with breakfast.  . glucose blood (RELION GLUCOSE TEST STRIPS) test strip Use as instructed  . ibuprofen (ADVIL) 800 MG tablet Take 1 tablet (800 mg total) by mouth every 8 (eight) hours as needed.  . insulin degludec (TRESIBA FLEXTOUCH) 200 UNIT/ML FlexTouch Pen Inject 30 Units into the skin daily.  . metFORMIN (GLUCOPHAGE) 1000 MG tablet Take 1 tablet (1,000 mg total) by mouth 2 (two) times daily with a meal.  . Multiple Vitamin (MULTIVITAMIN) tablet Take 1 tablet by mouth daily.  . pioglitazone (ACTOS) 30 MG tablet Take 1 tablet (30 mg total) by mouth daily.  . pravastatin (PRAVACHOL) 40 MG tablet Take 1 tablet (40 mg total) by mouth at bedtime.  . ramipril (ALTACE) 2.5 MG capsule Take 1 capsule (2.5 mg total) by mouth daily.  . Semaglutide,0.25 or 0.5MG /DOS, (OZEMPIC, 0.25 OR 0.5 MG/DOSE,) 2 MG/1.5ML SOPN Inject 0.5 mg into the skin once a week. (Patient taking differently: Inject 0.5 mg into the skin once a week. Increasing to 0.5 mg weekly this week.)  . traMADol (ULTRAM) 50 MG tablet Take 1 - 2 tablets every 6 hours as needed for pain    Facility-Administered Encounter Medications as of 02/16/2021  Medication  . ipratropium-albuterol (DUONEB) 0.5-2.5 (3) MG/3ML nebulizer solution 3 mL   Marcine Matar, [place] Clinical Pharmacist Assistant

## 2021-02-17 NOTE — Telephone Encounter (Cosign Needed)
  Chronic Care Management   Note  02/17/2021 Name: Brandi Velazquez MRN: [id number] DOB: Sep 12, 1961   Pharmacist spoke with patient about application for [place]. The program is not applicable since patient has part D. Spoke with patient about samples and voucher. Patient states she will be unable to afford once voucher expires. Patient would like to consider an alternative if possible. Pharmacist sent a message back to Dr. Tobie Poet asking for alternatives. Wilder Glade has patient assistance that patient is eligible for. Pharmacist will contact patient and coordinate once response from Dr. Tobie Poet.  Sherre Poot, PharmD, [facility] Clinical Pharmacist [name] [phone] (office) [phone] (mobile)

## 2021-02-17 NOTE — Progress Notes (Signed)
Patient is concerned on coverage of [place]. Patient assistance is not available since patient has insurance. Patient would like to consider another option for kidneys. Would patient be a candidate for [place]?

## 2021-02-20 NOTE — Progress Notes (Signed)
Patient asking if she should continue spironolactone or discontinue with Wilder Glade?    Pharmacist coordinated patient assistance application and samples.

## 2021-02-21 NOTE — Telephone Encounter (Signed)
  Chronic Care Management   Note  02/21/2021 Name: DAWANNA GRAUBERGER MRN: [id number] DOB: [birth year]   Pharmacist spoke to patient on the phone to inform about Dr. Tobie Poet approving Wilder Glade 5 mg daily. Dr. Tobie Poet confirmed continuing spironolactone daily as well. Educated patient on the importance of hydration, blood sugar control and blood pressure control to protect kidneys. Patient acknowledges understanding. Patient reports that she will stop by office to pick up samples and complete patient assistance application for Farxiga. Patient will contact pharmacist with any questions or concerns.   Sherre Poot, PharmD, [facility] Clinical Pharmacist [name] [phone] (office) [phone] (mobile)

## 2021-02-21 NOTE — Progress Notes (Signed)
Patient aware. will begin Farxiga samples and continue spironolactone.

## 2021-02-28 ENCOUNTER — Telehealth: Payer: Self-pay

## 2021-02-28 NOTE — Progress Notes (Signed)
Chronic Care Management Pharmacy Assistant   Name: Brandi Velazquez  MRN: [id number] DOB: May 25, 1961   Reason for Encounter: Medication coordination for [facility]  Recent office visits:  02/09/21-Dr [name] PCP, labs, Lumbar back pain: unable to continue PT due to caring for sick brother, Wilder Glade assistance discussed. Blood count normal. Liver function normal.  Kidney function normal. Cholesterol: well controlled.  HBA1C: 8.6. Worsened from 8.0. Recommend increase ozempic to 1 mg weekly.   Recent consult visits:  none  Hospital visits:  None in previous 6 months  Medications: Outpatient Encounter Medications as of 02/28/2021  Medication Sig   albuterol (VENTOLIN HFA) 108 (90 Base) MCG/ACT inhaler Inhale 2 puffs into the lungs every 6 (six) hours as needed for wheezing or shortness of breath.   dapagliflozin propanediol (FARXIGA) 5 MG TABS tablet Take 5 mg by mouth daily.   Finerenone (KERENDIA) 10 MG TABS Take 10 mg by mouth daily. (Patient not taking: Reported on 02/21/2021)   glimepiride (AMARYL) 4 MG tablet Take 2 tablets (8 mg total) by mouth daily with breakfast.   glucose blood (RELION GLUCOSE TEST STRIPS) test strip Use as instructed   ibuprofen (ADVIL) 800 MG tablet Take 1 tablet (800 mg total) by mouth every 8 (eight) hours as needed.   insulin degludec (TRESIBA FLEXTOUCH) 200 UNIT/ML FlexTouch Pen Inject 30 Units into the skin daily.   metFORMIN (GLUCOPHAGE) 1000 MG tablet Take 1 tablet (1,000 mg total) by mouth 2 (two) times daily with a meal.   Multiple Vitamin (MULTIVITAMIN) tablet Take 1 tablet by mouth daily.   pioglitazone (ACTOS) 30 MG tablet Take 1 tablet (30 mg total) by mouth daily.   pravastatin (PRAVACHOL) 40 MG tablet Take 1 tablet (40 mg total) by mouth at bedtime.   ramipril (ALTACE) 2.5 MG capsule Take 1 capsule (2.5 mg total) by mouth daily.   Semaglutide,0.25 or 0.5MG /DOS, (OZEMPIC, 0.25 OR 0.5 MG/DOSE,) 2 MG/1.5ML SOPN Inject 0.5 mg into the skin once a week.  (Patient taking differently: Inject 0.5 mg into the skin once a week. Increasing to 0.5 mg weekly this week.)   spironolactone (ALDACTONE) 25 MG tablet Take 25 mg by mouth daily.   traMADol (ULTRAM) 50 MG tablet Take 1 - 2 tablets every 6 hours as needed for pain   Facility-Administered Encounter Medications as of 02/28/2021  Medication   ipratropium-albuterol (DUONEB) 0.5-2.5 (3) MG/3ML nebulizer solution 3 mL    Reviewed chart for medication changes ahead of medication coordination call.  No OVs, Consults, or hospital visits since last care coordination call/Pharmacist visit. (If appropriate, list visit date, provider name)  No medication changes indicated OR if recent visit, treatment plan here.  BP Readings from Last 3 Encounters:  02/09/21 112/64  11/21/20 (!) 148/88  10/27/20 132/80    Lab Results  Component Value Date   HGBA1C 8.6 (H) 02/09/2021     Patient obtains medications through Adherence Packaging  30 Days   Last adherence delivery included:  01/09/21 delivery included Pravastatin 40mg     bedtime Glimepiride 4mg        2 tabs before breakfast Pioglitazone 30mg     Before breakfast  Patient declined (meds) last month due to PRN use/additional supply on hand. Spironolactone    Recent fill Ramipril                Recent fill (this was from previous pharmacy) Metformin            Recent fill  Patient is due for  next adherence delivery on: 03/09/21. Called patient and reviewed medications and coordinated delivery.  This delivery to include: Pravastatin 40mg     bedtime Glimepiride 4mg        2 tabs before breakfast Pioglitazone 30mg     Before breakfast Spironolactone 25mg  Before breakfast Metformin 1000mg   breakfast, evening meal  Ramipril 2.5mg - breakfast   Patient declined the following medications (meds) due to (reason) [name] and Ozempic- get from PAP  [place]- working on getting PAP  Patient needs refills for: None according to [facility] .  Patient  stated that since starting the Ozempic she has nausea and has vomiting once to twice a week.  I told her to contact the office and let Dr. Tobie Poet know she is having this side effect as her medication may need to be changed.  She stated she has about a weeks worth of Farxiga samples left.  I told her I would call and check on her application.    Called [place]- representative stated Wilder Glade was approved, shipment should take 7-14 days, have informed patient.   Confirmed delivery date of 03/09/21, advised patient that pharmacy will contact them the morning of delivery.  Clarita Leber, [place] Pharmacist Assistant [phone]

## 2021-03-07 DIAGNOSIS — Z0100 Encounter for examination of eyes and vision without abnormal findings: Secondary | ICD-10-CM | POA: Diagnosis not present

## 2021-03-07 DIAGNOSIS — E119 Type 2 diabetes mellitus without complications: Secondary | ICD-10-CM | POA: Diagnosis not present

## 2021-03-07 LAB — HM DIABETES EYE EXAM

## 2021-03-26 ENCOUNTER — Other Ambulatory Visit: Payer: Self-pay | Admitting: Family Medicine

## 2021-03-26 DIAGNOSIS — E782 Mixed hyperlipidemia: Secondary | ICD-10-CM

## 2021-03-29 ENCOUNTER — Telehealth: Payer: Self-pay

## 2021-03-29 NOTE — Progress Notes (Signed)
Chronic Care Management Pharmacy Assistant   Name: Brandi Velazquez  MRN: [id number] DOB: 1961-05-29   Reason for Encounter: Medication coordination for [facility]/Diabetes Assessment   Recent office visits:  02/09/21-Dr [name] PCP, labs, Lumbar back pain: unable to continue PT due to caring for sick brother, Wilder Glade assistance discussed. Blood count normal. Liver function normal. Kidney function normal. Cholesterol: well controlled.  HBA1C: 8.6. Worsened from 8.0. Recommend increase ozempic to 1 mg weekly per Dr Tobie Poet.   Recent consult visits:  none  Hospital visits:  None in previous 6 months  Medications: Outpatient Encounter Medications as of 03/29/2021  Medication Sig   albuterol (VENTOLIN HFA) 108 (90 Base) MCG/ACT inhaler Inhale 2 puffs into the lungs every 6 (six) hours as needed for wheezing or shortness of breath.   dapagliflozin propanediol (FARXIGA) 5 MG TABS tablet Take 5 mg by mouth daily.   Finerenone (KERENDIA) 10 MG TABS Take 10 mg by mouth daily. (Patient not taking: Reported on 02/21/2021)   glimepiride (AMARYL) 4 MG tablet TAKE TWO TABLETS BY MOUTH BEFORE BREAKFAST   glucose blood (RELION GLUCOSE TEST STRIPS) test strip Use as instructed   ibuprofen (ADVIL) 800 MG tablet Take 1 tablet (800 mg total) by mouth every 8 (eight) hours as needed.   insulin degludec (TRESIBA FLEXTOUCH) 200 UNIT/ML FlexTouch Pen Inject 30 Units into the skin daily.   metFORMIN (GLUCOPHAGE) 1000 MG tablet Take 1 tablet (1,000 mg total) by mouth 2 (two) times daily with a meal.   Multiple Vitamin (MULTIVITAMIN) tablet Take 1 tablet by mouth daily.   pioglitazone (ACTOS) 30 MG tablet Take 1 tablet (30 mg total) by mouth daily.   pravastatin (PRAVACHOL) 40 MG tablet TAKE ONE TABLET BY MOUTH EVERYDAY AT BEDTIME   ramipril (ALTACE) 2.5 MG capsule Take 1 capsule (2.5 mg total) by mouth daily.   Semaglutide,0.25 or 0.5MG /DOS, (OZEMPIC, 0.25 OR 0.5 MG/DOSE,) 2 MG/1.5ML SOPN Inject 0.5 mg into the skin once  a week. (Patient taking differently: Inject 0.5 mg into the skin once a week. Increasing to 0.5 mg weekly this week.)   spironolactone (ALDACTONE) 25 MG tablet Take 25 mg by mouth daily.   traMADol (ULTRAM) 50 MG tablet Take 1 - 2 tablets every 6 hours as needed for pain   Facility-Administered Encounter Medications as of 03/29/2021  Medication   ipratropium-albuterol (DUONEB) 0.5-2.5 (3) MG/3ML nebulizer solution 3 mL   Reviewed chart for medication changes ahead of medication coordination call.  No OVs, Consults, or hospital visits since last care coordination call/Pharmacist visit. (If appropriate, list visit date, provider name)  No medication changes indicated OR if recent visit, treatment plan here.  BP Readings from Last 3 Encounters:  02/09/21 112/64  11/21/20 (!) 148/88  10/27/20 132/80    Lab Results  Component Value Date   HGBA1C 8.6 (H) 02/09/2021     Patient obtains medications through Adherence Packaging  30 Days   Last adherence delivery included:  Pravastatin 40mg     bedtime Glimepiride 4mg        2 tabs before breakfast Pioglitazone 30mg     Before breakfast Spironolactone 25mg  Before breakfast Metformin 1000mg   breakfast, evening meal Ramipril 2.5mg - breakfast   Patient declined (meds) last month due to PRN use/additional supply on hand. [name] and Ozempic- get from PAP [place]- working on getting PAP    Patient is due for next adherence delivery on: 04/10/21. Called patient and reviewed medications and coordinated delivery.  This delivery to include: Pravastatin 40mg   bedtime Glimepiride 4mg        2 tabs before breakfast Pioglitazone 30mg     Before breakfast Spironolactone 25mg  Before breakfast Metformin 1000mg   breakfast, evening meal Ramipril 2.5mg - breakfast   Patient declined the following medications (meds) due to (reason) None  Patient needs refills for . Pravastatin 40mg   Glimepiride 4mg     Confirmed delivery date of 04/10/21, advised  patient that pharmacy will contact them the morning of delivery.  Patient stated she has not checked her blood sugar in the past week maybe two.  She stated her grandchildren got her test strips and scattered them.  She stated She does not check her feet regularly.  She stated she has an appointment to see a vein specialist.  Patient stated she would let me know if we needed to order her more strips.   Patient stated the last time she checked her glucose level it was around 150's.  She stated when it gets low, she gets hungry and shaky, but said that has not happened in a long time.    Clarita Leber, [place] Pharmacist Assistant [phone]

## 2021-03-29 NOTE — Progress Notes (Signed)
Chronic Care Management Pharmacy Note  04/14/2021 Name:  Brandi Velazquez MRN:  [id number] DOB:  May 29, 1961   Plan Recommendations:  Patient has been unable to check blood sugar in the past few weeks since supplies were scattered at home. Encouraged patient to begin checking to determine next steps in blood sugar management. Patient has upcoming blood work 09/01. Pharmacist will review with patient during next follow-up. Goal to discontinue pioglitazone and glimepiride and increase Farxiga 10 mg daily if tolerating and increase Ozempic to 1 mg weekly.    Subjective: Brandi Velazquez is an 60 y.o. year old female who is a primary patient of [name], Kirsten, MD.  The CCM team was consulted for assistance with disease management and care coordination needs.    Engaged with patient by telephone for follow up visit in response to provider referral for pharmacy case management and/or care coordination services.   Consent to Services:  The patient was given information about Chronic Care Management services, agreed to services, and gave verbal consent prior to initiation of services.  Please see initial visit note for detailed documentation.   Patient Care Team: [name], MD as PCP - General (Family Medicine) Burnice Logan, [facility] as Pharmacist (Pharmacist)  Recent office visits: 02/09/21-Dr [name] PCP, labs, Lumbar back pain: unable to continue PT due to caring for sick brother, [place] assistance discussed. Blood count normal. Liver function normal. Kidney function normal. Cholesterol: well controlled.  HBA1C: 8.6. Worsened from 8.0. Recommend increase ozempic to 1 mg weekly. 11/21/2020 - cough - azithromycin, benzonotate and duoneb.  10/27/2020 -  Continue current medications.   Recent consult visits: None since last visit  Hospital visits: None in previous 6 months  Objective:  Lab Results  Component Value Date   CREATININE 0.74 02/09/2021   BUN 21 02/09/2021   GFRNONAA 91 10/27/2020    GFRAA 105 10/27/2020   NA 143 02/09/2021   K 4.1 02/09/2021   CALCIUM 9.3 02/09/2021   CO2 23 02/09/2021   GLUCOSE 141 (H) 02/09/2021    Lab Results  Component Value Date/Time   HGBA1C 8.6 (H) 02/09/2021 01:18 PM   HGBA1C 8.0 (H) 10/27/2020 10:43 AM   MICROALBUR 30 07/24/2020 11:45 PM    Last diabetic Eye exam:  Lab Results  Component Value Date/Time   HMDIABEYEEXA No Retinopathy 03/07/2021 12:00 AM    Last diabetic Foot exam: No results found for: HMDIABFOOTEX   Lab Results  Component Value Date   CHOL 120 02/09/2021   HDL 47 02/09/2021   LDLCALC 58 02/09/2021   TRIG 75 02/09/2021   CHOLHDL 2.6 02/09/2021    Hepatic Function Latest Ref Rng & Units 02/09/2021 10/27/2020 07/18/2020  Total Protein 6.0 - 8.5 g/dL 6.7 6.7 7.2  Albumin 3.8 - 4.9 g/dL 4.3 4.3 4.4  AST 0 - 40 IU/L _0 ALT 0 - 32 IU/L _1 Alk Phosphatase 44 - 121 IU/L 57 55 57  Total Bilirubin 0.0 - 1.2 mg/dL 0.5 0.3 0.2    No results found for: TSH, FREET4  CBC Latest Ref Rng & Units 02/09/2021 10/27/2020 07/18/2020  WBC 3.4 - 10.8 x10E3/uL 8.5 13.0(H) 9.1  Hemoglobin 11.1 - 15.9 g/dL 14.8 14.9 13.8  Hematocrit 34.0 - 46.6 % 44.8 45.6 43.6  Platelets 150 - 450 x10E3/uL 235 252 230    No results found for: VD25OH  Clinical ASCVD: No  The ASCVD Risk score Mikey Bussing DC Jr., et al., 2013) failed to calculate for  the following reasons:   The valid total cholesterol range is 130 to 320 mg/dL    Depression screen [facility] 2/9 07/18/2020 07/13/2016  Decreased Interest 0 0  Down, Depressed, Hopeless 0 0  PHQ - 2 Score 0 0     Social History   Tobacco Use  Smoking Status Former   Types: Cigarettes   Quit date: 03/20/2009   Years since quitting: 12.0  Smokeless Tobacco Never   BP Readings from Last 3 Encounters:  02/09/21 112/64  11/21/20 (!) 148/88  10/27/20 132/80   Pulse Readings from Last 3 Encounters:  02/09/21 84  11/21/20 98  10/27/20 89   Wt Readings from Last 3 Encounters:  02/09/21  255 lb (115.7 kg)  11/21/20 260 lb (117.9 kg)  10/27/20 266 lb (120.7 kg)   BMI Readings from Last 3 Encounters:  02/09/21 45.17 kg/m  11/21/20 46.06 kg/m  10/27/20 47.12 kg/m    Assessment/Interventions: Review of patient past medical history, allergies, medications, health status, including review of consultants reports, laboratory and other test data, was performed as part of comprehensive evaluation and provision of chronic care management services.   SDOH:  (Social Determinants of Health) assessments and interventions performed: Yes   CCM Care Plan  Allergies  Allergen Reactions   Bactrim [Sulfamethoxazole-Trimethoprim] Swelling   Morphine And Related     Medications Reviewed Today     Reviewed by Burnice Logan, [facility] (Pharmacist) on 04/14/21 at 1148  Med List Status: <None>   Medication Order Taking? Sig Documenting Provider Last Dose Status Informant  albuterol (VENTOLIN HFA) 108 (90 Base) MCG/ACT inhaler [id number] Yes Inhale 2 puffs into the lungs every 6 (six) hours as needed for wheezing or shortness of breath. [name], Kirsten, MD Taking Active   dapagliflozin propanediol (FARXIGA) 5 MG TABS tablet [id number] Yes Take 5 mg by mouth daily. [provider] Taking Active   Finerenone (KERENDIA) 10 MG TABS [id number] No Take 10 mg by mouth daily.  Patient not taking: No sig reported   [name], [name], MD Not Taking Active   glimepiride (AMARYL) 4 MG tablet [id number] Yes TAKE TWO TABLETS BY MOUTH BEFORE BREAKFAST [name], Kirsten, MD Taking Active   glucose blood (RELION GLUCOSE TEST STRIPS) test strip [id number] Yes Use as instructed [name], [name], MD Taking Active   ibuprofen (ADVIL) 800 MG tablet [id number] Yes Take 1 tablet (800 mg total) by mouth every 8 (eight) hours as needed. [name], Kirsten, MD Taking Active   insulin degludec (TRESIBA FLEXTOUCH) 200 UNIT/ML FlexTouch Pen [id number] Yes Inject 30 Units into the skin daily. [name], Kirsten, MD Taking Active    ipratropium-albuterol (DUONEB) 0.5-2.5 (3) MG/3ML nebulizer solution 3 mL [id number]   Rip Harbour, NP  Active   metFORMIN (GLUCOPHAGE) 1000 MG tablet [id number] Yes Take 1 tablet (1,000 mg total) by mouth 2 (two) times daily with a meal. [name], Kirsten, MD Taking Active   Multiple Vitamin (MULTIVITAMIN) tablet [id number] Yes Take 1 tablet by mouth daily. [provider] Taking Active   pioglitazone (ACTOS) 30 MG tablet [id number] Yes Take 1 tablet (30 mg total) by mouth daily. [name], Kirsten, MD Taking Active   pravastatin (PRAVACHOL) 40 MG tablet [id number] Yes TAKE ONE TABLET BY MOUTH EVERYDAY AT BEDTIME [name], Kirsten, MD Taking Active   ramipril (ALTACE) 2.5 MG capsule [id number] Yes Take 1 capsule (2.5 mg total) by mouth daily. [name], Kirsten, MD Taking Active   Semaglutide,0.25 or 0.5MG /DOS, (OZEMPIC, 0.25 OR 0.5 MG/DOSE,) 2 MG/1.5ML SOPN [id number] Yes Inject 0.5  mg into the skin once a week.  Patient taking differently: Inject 0.5 mg into the skin once a week. Increasing to 0.5 mg weekly this week.   [name], Kirsten, MD Taking Active   spironolactone (ALDACTONE) 25 MG tablet [id number] Yes Take 25 mg by mouth daily. [provider] Taking Active   traMADol (ULTRAM) 50 MG tablet [id number] Yes Take 1 - 2 tablets every 6 hours as needed for pain [provider] Taking Active             Patient Active Problem List   Diagnosis Date Noted   Morbid obesity with BMI of 45.0-49.9, adult ([place]) 10/30/2020   Diabetes mellitus ([place]) 04/17/2020   Hyperlipidemia    Essential hypertension    GERD (gastroesophageal reflux disease)    Chronic midline low back pain without sciatica 01/10/2020   Back pain of lumbar region with sciatica 01/07/2020   Other acute recurrent sinusitis 10/29/2019   Cough 10/29/2019   Varicose veins of lower extremities with other complications 42/68/3419   Varicose veins of lower extremities with inflammation 06/14/2011    Immunization History   Administered Date(s) Administered   Influenza Inj Mdck Quad Pf 07/18/2020   Moderna SARS-COV2 Booster Vaccination 02/09/2021   Moderna Sars-Covid-2 Vaccination 10/07/2019, 11/02/2019   Pneumococcal Polysaccharide-23 10/09/2011   Tdap 05/15/2018    Conditions to be addressed/monitored:  Hyperlipidemia and Diabetes  Care Plan : [place]  Updates made by Burnice Logan, [place] since 04/14/2021 12:00 AM     Problem: htn, hld, dm   Priority: High  Onset Date: 12/07/2020     Long-Range Goal: Disease Management   Start Date: 12/07/2020  Expected End Date: 12/08/2021  Recent Progress: On track  Priority: High  Note:   Current Barriers:  Unable to independently afford treatment regimen Unable to achieve control of blood sugar    Pharmacist Clinical Goal(s):  Patient will verbalize ability to afford treatment regimen achieve control of diabetes as evidenced by blood sugar through collaboration with PharmD and provider.   Interventions: 1:1 collaboration with [name], [name], MD regarding development and update of comprehensive plan of care as evidenced by provider attestation and co-signature Inter-disciplinary care team collaboration (see longitudinal plan of care) Comprehensive medication review performed; medication list updated in electronic medical record  Hypertension (BP goal <130/80) -Controlled -Current treatment: ramipril 2.5 mg daily  Spironolactone 25 mg daily  -Medications previously tried: none reported  -Current home readings: well controlled per patient  -Current dietary habits: has had very little appetite since recovering from pneumonia -Current exercise habits: limited due to mobility -Denies hypotensive/hypertensive symptoms -Educated on BP goals and benefits of medications for prevention of heart attack, stroke and kidney damage; Daily salt intake goal < 2300 mg; Exercise goal of 150 minutes per week; -Counseled to monitor BP at home weekly,  document, and provide log at future appointments -Counseled on diet and exercise extensively and encouraged to resume PT.  Recommended to continue current medication  Hyperlipidemia: (LDL goal < 70) -Not ideally controlled -Current treatment: pravastatin 40 mg at bedtime  -Medications previously tried: none reported  -Current dietary patterns: limited appetite lately since pneumonia -Current exercise habits: limited due to mobility -Educated on Cholesterol goals;  Benefits of statin for ASCVD risk reduction; Importance of limiting foods high in cholesterol; Exercise goal of 150 minutes per week; -Counseled on diet and exercise extensively Recommended to continue current medication and consider change in statin after next blood work if LDL above goal.  Diabetes (A1c goal <7%) -Not ideally controlled -Current medications: Ozmepic 0.5 mg weekly for 4 weeks then 1 mg weekly if tolerating well Tresiba 30 units  Actos 30 mg daily  Glimepiride 4 mg daily with breakfast [employer] testing supplies  Metformin 1000 mg bid  Farxiga 5 mg daily  -Medications previously tried: 70/30 insulin   -Current home glucose readings fasting glucose: unable to check currently  -Denies hypoglycemic/hyperglycemic symptoms -Current meal patterns:  Patient doesn't snack. Eats 2-3 meals daily.  -Current exercise: limited due to mobility - seeing vein specialist soon -Educated on A1c and blood sugar goals; Complications of diabetes including kidney damage, retinal damage, and cardiovascular disease; Exercise goal of 150 minutes per week; Benefits of weight loss; Benefits of routine self-monitoring of blood sugar; Carbohydrate counting and/or plate method -Counseled to check feet daily and get yearly eye exams -Counseled on diet and exercise extensively. Pharmacist will review upcoming blood work. Goal to stop glimepiride, pioglitazone and increase Ozempic to 1 mg weekly. Goal to maximize Farxiga for kidney  protection.    Patient Goals/Self-Care Activities Patient will:  - take medications as prescribed focus on medication adherence by using pill box check glucose daily, document, and provide at future appointments target a minimum of 150 minutes of moderate intensity exercise weekly engage in dietary modifications by limiting carbohydrates and balancing meals using plate method.   Follow Up Plan: Telephone follow up appointment with care management team member scheduled for: 05/2021       Medication Assistance:  [name] and Ozempicobtained through [employer] medication assistance program.  Enrollment ends 08/16/2021  Patient's preferred pharmacy is:  [facility] [place], [place] [address] Phone: [phone] Fax: [phone]  [facility] - [place], [place] - [address] [place] [address] Phone: [phone] Fax: [phone]  Uses pill box? Yes Pt endorses good compliance  We discussed: Benefits of medication synchronization, packaging and delivery as well as enhanced pharmacist oversight with [facility]. Patient decided to: Utilize [facility] pharmacy for medication synchronization, packaging and delivery  Care Plan and Follow Up Patient Decision:  Patient agrees to Care Plan and Follow-up.  Plan: Telephone follow up appointment with care management team member scheduled for:  05/2021

## 2021-03-30 ENCOUNTER — Ambulatory Visit (INDEPENDENT_AMBULATORY_CARE_PROVIDER_SITE_OTHER): Payer: Medicare HMO

## 2021-03-30 DIAGNOSIS — E1169 Type 2 diabetes mellitus with other specified complication: Secondary | ICD-10-CM | POA: Diagnosis not present

## 2021-03-30 DIAGNOSIS — E782 Mixed hyperlipidemia: Secondary | ICD-10-CM

## 2021-03-30 DIAGNOSIS — E1142 Type 2 diabetes mellitus with diabetic polyneuropathy: Secondary | ICD-10-CM

## 2021-04-14 ENCOUNTER — Other Ambulatory Visit: Payer: Self-pay

## 2021-04-14 DIAGNOSIS — I8310 Varicose veins of unspecified lower extremity with inflammation: Secondary | ICD-10-CM

## 2021-04-14 NOTE — Patient Instructions (Signed)
Visit Information   Goals Addressed   None    Patient Care Plan: CCM Pharmacy Care Plan     Problem Identified: htn, hld, dm   Priority: High  Onset Date: 12/07/2020     Long-Range Goal: Disease Management   Start Date: 12/07/2020  Expected End Date: 12/08/2021  Recent Progress: On track  Priority: High  Note:   Current Barriers:  Unable to independently afford treatment regimen Unable to achieve control of blood sugar    Pharmacist Clinical Goal(s):  Patient will verbalize ability to afford treatment regimen achieve control of diabetes as evidenced by blood sugar through collaboration with PharmD and provider.   Interventions: 1:1 collaboration with [name], [name], MD regarding development and update of comprehensive plan of care as evidenced by provider attestation and co-signature Inter-disciplinary care team collaboration (see longitudinal plan of care) Comprehensive medication review performed; medication list updated in electronic medical record  Hypertension (BP goal <130/80) -Controlled -Current treatment: ramipril 2.5 mg daily  Spironolactone 25 mg daily  -Medications previously tried: none reported  -Current home readings: well controlled per patient  -Current dietary habits: has had very little appetite since recovering from pneumonia -Current exercise habits: limited due to mobility -Denies hypotensive/hypertensive symptoms -Educated on BP goals and benefits of medications for prevention of heart attack, stroke and kidney damage; Daily salt intake goal < 2300 mg; Exercise goal of 150 minutes per week; -Counseled to monitor BP at home weekly, document, and provide log at future appointments -Counseled on diet and exercise extensively and encouraged to resume PT.  Recommended to continue current medication  Hyperlipidemia: (LDL goal < 70) -Not ideally controlled -Current treatment: pravastatin 40 mg at bedtime  -Medications previously tried: none reported   -Current dietary patterns: limited appetite lately since pneumonia -Current exercise habits: limited due to mobility -Educated on Cholesterol goals;  Benefits of statin for ASCVD risk reduction; Importance of limiting foods high in cholesterol; Exercise goal of 150 minutes per week; -Counseled on diet and exercise extensively Recommended to continue current medication and consider change in statin after next blood work if LDL above goal.   Diabetes (A1c goal <7%) -Not ideally controlled -Current medications: Ozmepic 0.5 mg weekly for 4 weeks then 1 mg weekly if tolerating well Tresiba 30 units  Actos 30 mg daily  Glimepiride 4 mg daily with breakfast [employer] testing supplies  Metformin 1000 mg bid  Farxiga 5 mg daily  -Medications previously tried: 70/30 insulin   -Current home glucose readings fasting glucose: unable to check currently  -Denies hypoglycemic/hyperglycemic symptoms -Current meal patterns:  Patient doesn't snack. Eats 2-3 meals daily.  -Current exercise: limited due to mobility - seeing vein specialist soon -Educated on A1c and blood sugar goals; Complications of diabetes including kidney damage, retinal damage, and cardiovascular disease; Exercise goal of 150 minutes per week; Benefits of weight loss; Benefits of routine self-monitoring of blood sugar; Carbohydrate counting and/or plate method -Counseled to check feet daily and get yearly eye exams -Counseled on diet and exercise extensively. Pharmacist will review upcoming blood work. Goal to stop glimepiride, pioglitazone and increase Ozempic to 1 mg weekly. Goal to maximize Farxiga for kidney protection.    Patient Goals/Self-Care Activities Patient will:  - take medications as prescribed focus on medication adherence by using pill box check glucose daily, document, and provide at future appointments target a minimum of 150 minutes of moderate intensity exercise weekly engage in dietary modifications by  limiting carbohydrates and balancing meals using plate method.  Follow Up Plan: Telephone follow up appointment with care management team member scheduled for: 05/2021      The patient verbalized understanding of instructions, educational materials, and care plan provided today and declined offer to receive copy of patient instructions, educational materials, and care plan.  Telephone follow up appointment with pharmacy team member scheduled for:  Burnice Logan, [facility]

## 2021-04-19 ENCOUNTER — Telehealth: Payer: Self-pay

## 2021-04-19 NOTE — Progress Notes (Signed)
    Chronic Care Management Pharmacy Assistant   Name: [name]  MRN: [id number] DOB: 08/15/1961   Reason for Encounter: Medication Review for Farxiga dose increase    Medications: Outpatient Encounter Medications as of 04/19/2021  Medication Sig   albuterol (VENTOLIN HFA) 108 (90 Base) MCG/ACT inhaler Inhale 2 puffs into the lungs every 6 (six) hours as needed for wheezing or shortness of breath.   dapagliflozin propanediol (FARXIGA) 5 MG TABS tablet Take 5 mg by mouth daily.   Finerenone (KERENDIA) 10 MG TABS Take 10 mg by mouth daily. (Patient not taking: No sig reported)   glimepiride (AMARYL) 4 MG tablet TAKE TWO TABLETS BY MOUTH BEFORE BREAKFAST   glucose blood (RELION GLUCOSE TEST STRIPS) test strip Use as instructed   ibuprofen (ADVIL) 800 MG tablet Take 1 tablet (800 mg total) by mouth every 8 (eight) hours as needed.   insulin degludec (TRESIBA FLEXTOUCH) 200 UNIT/ML FlexTouch Pen Inject 30 Units into the skin daily.   metFORMIN (GLUCOPHAGE) 1000 MG tablet Take 1 tablet (1,000 mg total) by mouth 2 (two) times daily with a meal.   Multiple Vitamin (MULTIVITAMIN) tablet Take 1 tablet by mouth daily.   pioglitazone (ACTOS) 30 MG tablet Take 1 tablet (30 mg total) by mouth daily.   pravastatin (PRAVACHOL) 40 MG tablet TAKE ONE TABLET BY MOUTH EVERYDAY AT BEDTIME   ramipril (ALTACE) 2.5 MG capsule Take 1 capsule (2.5 mg total) by mouth daily.   Semaglutide,0.25 or 0.'5MG'$ /DOS, (OZEMPIC, 0.25 OR 0.5 MG/DOSE,) 2 MG/1.5ML SOPN Inject 0.5 mg into the skin once a week. (Patient taking differently: Inject 0.5 mg into the skin once a week. Increasing to 0.5 mg weekly this week.)   spironolactone (ALDACTONE) 25 MG tablet Take 25 mg by mouth daily.   traMADol (ULTRAM) 50 MG tablet Take 1 - 2 tablets every 6 hours as needed for pain   Facility-Administered Encounter Medications as of 04/19/2021  Medication   ipratropium-albuterol (DUONEB) 0.5-2.5 (3) MG/3ML nebulizer solution 3 mL     Patient called and stated she had been waiting on a call back to confirm that she was to increase the current dose of her [place].   Per chart note on 03/30/21 the following was stated: Patient has upcoming blood work 09/01. Pharmacist will review with patient during next follow-up. Goal to discontinue pioglitazone and glimepiride and increase Farxiga 10 mg daily if tolerating and increase Ozempic to 1 mg weekly.   I advised the patient to continue her current dosing until after her appointment in September. The patient stated that she had already increased her Ozempic to '1mg'$ , she stated she had been doing this now for about a month to month and a half, she denied any reactions or issues with the increase.    Patient stated she has not been taking her blood sugars, she stated that her family had been exposed to Covid and had some symptoms, but had not gotten tested.  I advised her to contact the office if symptoms worsened or she had any questions or concerns.    Brandi Velazquez, [place] Pharmacist Assistant [phone]

## 2021-04-20 ENCOUNTER — Encounter: Payer: Self-pay | Admitting: Vascular Surgery

## 2021-04-20 ENCOUNTER — Other Ambulatory Visit: Payer: Self-pay

## 2021-04-20 ENCOUNTER — Ambulatory Visit: Payer: Medicare HMO | Admitting: Vascular Surgery

## 2021-04-20 ENCOUNTER — Ambulatory Visit (HOSPITAL_COMMUNITY)
Admission: RE | Admit: 2021-04-20 | Discharge: 2021-04-20 | Disposition: A | Discharge status: Home / Self Care | Payer: Medicare HMO | Source: Ambulatory Visit | Attending: Vascular Surgery | Admitting: Vascular Surgery

## 2021-04-20 VITALS — BP 101/68 | HR 87 | Temp 98.7°F | Resp 20 | Ht 63.0 in | Wt 241.0 lb

## 2021-04-20 DIAGNOSIS — I83813 Varicose veins of bilateral lower extremities with pain: Secondary | ICD-10-CM

## 2021-04-20 DIAGNOSIS — I8310 Varicose veins of unspecified lower extremity with inflammation: Secondary | ICD-10-CM | POA: Diagnosis not present

## 2021-04-20 NOTE — Progress Notes (Signed)
Patient name: Brandi Velazquez MRN: [id number] DOB: 1961-07-29 Sex: female  REASON FOR CONSULT: Bleeding ankle varicosity  HPI: Brandi Velazquez is a 60 y.o. female, who recently had a bleeding episode from a varicose vein at her right ankle region.  This was about 1 month ago.  She had significant bleeding that soaked 2 towels but she was able to get the bleeding to stop.  Since that time she has stopped shaving her legs because she is worried about trauma to these veins.  She also has small dogs and she is worried that they will cause bleeding as well.  She was previously evaluated by my partner Dr. Kellie Simmering about 10 years ago and at that time was not really noted to have much superficial or deep venous reflux.  Lower extremity compression stockings were prescribed but the patient cannot really wear these or apply them because of severe back problems.  She has never had any venous intervention procedures.  She has no history of DVT.  She has been struggling with obesity but has lost about 40 pounds with changes in her diet and exercise.  She does not really have any aching or pain in her legs.  She does not really complain of swelling in her legs.  Other medical problems include obesity, chronic back pain, COPD, diabetes, hyperlipidemia, hypertension, sleep apnea all of which have been stable.  Past Medical History:  Diagnosis Date   Ankle pain    Chronic pain    back   COPD (chronic obstructive pulmonary disease) (HCC)    Depression    Diabetes mellitus    GERD (gastroesophageal reflux disease)    Hyperlipidemia    Hypertension    Low back pain    Obstructive sleep apnea    Pneumonia 2010   Post-menopausal atrophic vaginitis    Superficial phlebitis    Past Surgical History:  Procedure Laterality Date   BACK SURGERY  1990   CESAREAN SECTION     CYST REMOVAL HAND     inner thigh   SPINE SURGERY     lumbar laminectomy and diskectomy    Family History  Problem Relation Age of Onset    Diabetes Mother    Heart disease Mother    Cancer Father    Stroke Sister     SOCIAL HISTORY: Social History   Socioeconomic History   Marital status: Single    Spouse name: Not on file   Number of children: Not on file   Years of education: Not on file   Highest education level: Not on file  Occupational History   Not on file  Tobacco Use   Smoking status: Former    Types: Cigarettes    Quit date: 03/20/2009    Years since quitting: 12.0   Smokeless tobacco: Never  Vaping Use   Vaping Use: Never used  Substance and Sexual Activity   Alcohol use: No   Drug use: No   Sexual activity: Not on file  Other Topics Concern   Not on file  Social History Narrative   Not on file   Social Determinants of Health   Financial Resource Strain: Not on file  Food Insecurity: Unknown   Worried About Running Out of Food in the Last Year: Not on file   [employer] of Food in the Last Year: Never true  Transportation Needs: No Transportation Needs   Lack of Transportation (Medical): No   Lack of Transportation (Non-Medical): No  Physical Activity:  Not on file  Stress: Not on file  Social Connections: Not on file  Intimate Partner Violence: Not on file    Allergies  Allergen Reactions   Bactrim [Sulfamethoxazole-Trimethoprim] Swelling   Morphine And Related     Current Outpatient Medications  Medication Sig Dispense Refill   albuterol (VENTOLIN HFA) 108 (90 Base) MCG/ACT inhaler Inhale 2 puffs into the lungs every 6 (six) hours as needed for wheezing or shortness of breath. 8 g 2   dapagliflozin propanediol (FARXIGA) 5 MG TABS tablet Take 5 mg by mouth daily.     Finerenone (KERENDIA) 10 MG TABS Take 10 mg by mouth daily. 30 tablet 2   glimepiride (AMARYL) 4 MG tablet TAKE TWO TABLETS BY MOUTH BEFORE BREAKFAST 90 tablet 1   glucose blood (RELION GLUCOSE TEST STRIPS) test strip Use as instructed 100 each 2   ibuprofen (ADVIL) 800 MG tablet Take 1 tablet (800 mg total) by mouth every  8 (eight) hours as needed. 30 tablet 0   insulin degludec (TRESIBA FLEXTOUCH) 200 UNIT/ML FlexTouch Pen Inject 30 Units into the skin daily. 15 mL 0   metFORMIN (GLUCOPHAGE) 1000 MG tablet Take 1 tablet (1,000 mg total) by mouth 2 (two) times daily with a meal. 180 tablet 1   Multiple Vitamin (MULTIVITAMIN) tablet Take 1 tablet by mouth daily.     pioglitazone (ACTOS) 30 MG tablet Take 1 tablet (30 mg total) by mouth daily. 90 tablet 1   pravastatin (PRAVACHOL) 40 MG tablet TAKE ONE TABLET BY MOUTH EVERYDAY AT BEDTIME 90 tablet 1   ramipril (ALTACE) 2.5 MG capsule Take 1 capsule (2.5 mg total) by mouth daily. 90 capsule 1   Semaglutide,0.25 or 0.'5MG'$ /DOS, (OZEMPIC, 0.25 OR 0.5 MG/DOSE,) 2 MG/1.5ML SOPN Inject 0.5 mg into the skin once a week. (Patient taking differently: Inject 0.5 mg into the skin once a week. Increasing to 0.5 mg weekly this week.) 1.5 mL 2   spironolactone (ALDACTONE) 25 MG tablet Take 25 mg by mouth daily.     traMADol (ULTRAM) 50 MG tablet Take 1 - 2 tablets every 6 hours as needed for pain     Current Facility-Administered Medications  Medication Dose Route Frequency Provider Last Rate Last Admin   ipratropium-albuterol (DUONEB) 0.5-2.5 (3) MG/3ML nebulizer solution 3 mL  3 mL Nebulization Once Rip Harbour, NP        ROS:   General:  No weight loss, Fever, chills  HEENT: No recent headaches, no nasal bleeding, no visual changes, no sore throat  Neurologic: No dizziness, blackouts, seizures. No recent symptoms of stroke or mini- stroke. No recent episodes of slurred speech, or temporary blindness.  Cardiac: No recent episodes of chest pain/pressure, no shortness of breath at rest.  No shortness of breath with exertion.  Denies history of atrial fibrillation or irregular heartbeat  Vascular: No history of rest pain in feet.  No history of claudication.  No history of non-healing ulcer, No history of DVT   Pulmonary: No home oxygen, no productive cough, no  hemoptysis,  No asthma or wheezing  Musculoskeletal:  '[X]'$  Arthritis, '[X]'$  Low back pain,  '[X]'$  Joint pain  Hematologic:No history of hypercoagulable state.  No history of easy bleeding.  No history of anemia  Gastrointestinal: No hematochezia or melena,  No gastroesophageal reflux, no trouble swallowing  Urinary: '[ ]'$  chronic Kidney disease, '[ ]'$  on HD - '[ ]'$  MWF or '[ ]'$  TTHS, '[ ]'$  Burning with urination, '[ ]'$  Frequent urination, '[ ]'$   Difficulty urinating;   Skin: No rashes  Psychological: No history of anxiety,  No history of depression   Physical Examination  Vitals:   04/20/21 1309  BP: 101/68  Pulse: 87  Resp: 20  Temp: 98.7 F (37.1 C)  SpO2: 93%  Weight: 241 lb (109.3 kg)  Height: '5\' 3"'$  (1.6 m)    Body mass index is 42.69 kg/m.  General:  Alert and oriented, no acute distress HEENT: Normal Neck: No JVD Cardiac: Regular Rate and Rhythm Skin: No rash clusters of small blisterlike varicosities bilateral ankle region Extremity Pulses:  2+ dorsalis pedis, posterior tibial pulses bilaterally Musculoskeletal: No deformity or edema  Neurologic: Upper and lower extremity motor 5/5 and symmetric  DATA:  Patient had a venous reflux exam performed today.  She had no reflux in the right leg.  She did have a short segment of reflux in the calf portion of the greater saphenous vein on the left side but vein diameter was less than 2 mm.  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  She does not really have significant reflux.  She has had 1 bleeding episode from ankle varicosity at this point.   PLAN: Patient would benefit from bilateral ankle sclerotherapy to prevent bleeding episodes.  We will see if we can get her scheduled for this in the near future pending insurance approval.  I did again discussed wearing compression stockings and I think this is going to be difficult for her but she will try to participate with  this.   Ruta Hinds, MD [facility] Office: [phone]

## 2021-04-23 ENCOUNTER — Telehealth: Payer: Self-pay

## 2021-04-23 DIAGNOSIS — E1142 Type 2 diabetes mellitus with diabetic polyneuropathy: Secondary | ICD-10-CM

## 2021-04-23 NOTE — Telephone Encounter (Cosign Needed)
  Chronic Care Management   Note  04/23/2021 Name: Brandi Velazquez MRN: [id number] DOB: 05-May-1961   Patient is tolerating Farxiga 5 mg daily without side effects. She is not checking blood sugar currently but denies symptoms of low blood sugar. Encouraged patient to resume checking blood sugar daily. Pharmacist discussed goal of reducing additional diabetic medications (glimepiride and pioglitazone) last month during follow-up visit.   Patient is willing to increase Farxiga 10 mg daily if Dr. Tobie Poet agrees. Pharmacist advised patient to check blood sugar and be able to provide readings at upcoming visit with Dr. Tobie Poet. If 10 mg dose approved, pharmacist can coordinate updated prescription through [place] [employer] [place].   Sherre Poot, PharmD, [facility] Clinical Pharmacist [name] [phone] (office) [phone] (mobile)

## 2021-04-25 ENCOUNTER — Other Ambulatory Visit: Payer: Self-pay | Admitting: Family Medicine

## 2021-04-25 MED ORDER — DAPAGLIFLOZIN PROPANEDIOL 10 MG PO TABS: 10.0000 mg | ORAL_TABLET | Freq: Every day | ORAL | 1 refills | Status: DC

## 2021-04-25 NOTE — Addendum Note (Signed)
Addended by: Donette Larry B on: 04/25/2021 10:55 AM   Modules accepted: Orders

## 2021-04-25 NOTE — Progress Notes (Signed)
I spoke with Brandi Velazquez this morning and informed her that she is now able to increase her Wilder Glade dosage to 10 mg. She is aware that she will now be taking two tablets daily instead of one. She agreed to keep record of her BS for her next visit with PCP. A call will be made to [employer], patient assistance company for [place], to follow up on the new script sent by CPP.   Wilford Sports CPA, CMA

## 2021-04-27 ENCOUNTER — Telehealth: Payer: Self-pay

## 2021-04-27 NOTE — Chronic Care Management (AMB) (Signed)
04/27/2021- Called patient to inform her Ozempic and [name] patient assistance medication from [employer] patient assistance program has arrived to [employer] and ready for pick up. Patient aware.  Pattricia Boss, [place] Pharmacist Assistant [phone]

## 2021-04-27 NOTE — Telephone Encounter (Signed)
Patient assistance received today for ozempic and tresiba, medications placed in sample fridge. Patient has not been notified.

## 2021-04-28 ENCOUNTER — Telehealth: Payer: Self-pay

## 2021-04-28 NOTE — Chronic Care Management (AMB) (Signed)
Chronic Care Management Pharmacy Assistant   Name: Brandi Velazquez  MRN: [id number] DOB: Feb 02, 1961  Reason for Encounter: Medication Review/ Medication Coordination Call  Recent office visits:  None  Recent consult visits:  04/20/2021- Dr. Ruta Hinds (Vascular Surgery)- No medication changes noted.  04/20/2021- LE Venous Reflux Procedure   Hospital visits:  None in previous 6 months  Reviewed chart for medication changes ahead of medication coordination call.  No medication changes indicated.  BP Readings from Last 3 Encounters:  04/20/21 101/68  02/09/21 112/64  11/21/20 (!) 148/88    Lab Results  Component Value Date   HGBA1C 8.6 (H) 02/09/2021     Patient obtains medications through Adherence Packaging  30 Days   Last adherence delivery included:  Pravastatin 40 mg- 1 tablet daily (bedtime) Glimepiride 4 mg- 2 tablets daily (before breakfast) Pioglitazone 30 mg- 1 tablet daily  (before breakfast) Spironolactone 25 mg- 1 tablet daily (before breakfast) Metformin 1000 mg- 1 tablet twice daily (breakfast, evening meal) Ramipril 2.5 mg-1 tablet daily (breakfast)  Patient declined the following medications last month: None  Patient is due for next adherence delivery on: 05/09/2021. Called patient and reviewed medications and coordinated delivery.  This delivery to include: Pravastatin 40 mg- 1 tablet daily (bedtime) Glimepiride 4 mg- 2 tablets daily (before breakfast) Pioglitazone 30 mg- 1 tablet daily  (before breakfast) Spironolactone 25 mg- 1 tablet daily (before breakfast) Metformin 1000 mg- 1 tablet twice daily (breakfast, evening meal) Ramipril 2.5 mg-1 tablet daily (breakfast)  No short fill or acute fill needed.   Patient declined the following medications: None  Patient needs refills for: None.  Confirmed delivery date of 05/09/2021, advised patient that pharmacy will contact them the morning of delivery.   Medications: Outpatient Encounter  Medications as of 04/28/2021  Medication Sig   albuterol (VENTOLIN HFA) 108 (90 Base) MCG/ACT inhaler Inhale 2 puffs into the lungs every 6 (six) hours as needed for wheezing or shortness of breath.   dapagliflozin propanediol (FARXIGA) 10 MG TABS tablet Take 1 tablet (10 mg total) by mouth daily before breakfast.   Finerenone (KERENDIA) 10 MG TABS Take 10 mg by mouth daily.   glimepiride (AMARYL) 4 MG tablet TAKE TWO TABLETS BY MOUTH BEFORE BREAKFAST   glucose blood (RELION GLUCOSE TEST STRIPS) test strip Use as instructed   ibuprofen (ADVIL) 800 MG tablet Take 1 tablet (800 mg total) by mouth every 8 (eight) hours as needed.   insulin degludec (TRESIBA FLEXTOUCH) 200 UNIT/ML FlexTouch Pen Inject 30 Units into the skin daily.   metFORMIN (GLUCOPHAGE) 1000 MG tablet TAKE ONE TABLET BY MOUTH TWICE DAILY WITH A MEAL   Multiple Vitamin (MULTIVITAMIN) tablet Take 1 tablet by mouth daily.   pioglitazone (ACTOS) 30 MG tablet Take 1 tablet (30 mg total) by mouth daily.   pravastatin (PRAVACHOL) 40 MG tablet TAKE ONE TABLET BY MOUTH EVERYDAY AT BEDTIME   ramipril (ALTACE) 2.5 MG capsule Take 1 capsule (2.5 mg total) by mouth daily.   Semaglutide,0.25 or 0.'5MG'$ /DOS, (OZEMPIC, 0.25 OR 0.5 MG/DOSE,) 2 MG/1.5ML SOPN Inject 0.5 mg into the skin once a week. (Patient taking differently: Inject 0.5 mg into the skin once a week. Increasing to 0.5 mg weekly this week.)   spironolactone (ALDACTONE) 25 MG tablet Take 25 mg by mouth daily.   traMADol (ULTRAM) 50 MG tablet Take 1 - 2 tablets every 6 hours as needed for pain   Facility-Administered Encounter Medications as of 04/28/2021  Medication   ipratropium-albuterol (  DUONEB) 0.5-2.5 (3) MG/3ML nebulizer solution 3 mL    Care Gaps: DEXA SCAN- Overdue - never done  HIV Screening (Once) - Never Done  Hepatitis C Screening (Once)- Never Done  Zoster Vaccines- Shingrix (1 of 2)-   Never Done  PAP SMEAR-Modifier (Every 3 Years) Pneumococcal Vaccine 0-64 Years  old (2 - PCV)- Last completed: Oct 09, 2011  INFLUENZA VACCINE- Due  Star Rating Drugs: Pravastatin 40 mg- Last filled 03/06/2021 for a 30 day supply at [facility] Glimepiride 4 mg- Last filled 03/06/2021 for a 30 day supply at [facility] Pioglitazone 30 mg- Last filled 03/06/2021 for a 30 day supply at [place] [facility] Metformin 1000 mg- Last filled 03/06/2021 for a 30 day supply at [facility] Ramipril 2.5 mg-Last filled 03/06/2021 for a 30 day supply at [place], [place] Pharmacist Assistant [phone]

## 2021-05-17 NOTE — Progress Notes (Signed)
Subjective:  Patient ID: Brandi Velazquez, female    DOB: 08-Aug-1961  Age: 60 y.o. MRN: [id number]  Chief Complaint  Patient presents with   Diabetes   Hypertension   Hyperlipidemia   HPI DM- checking sugars every other day. Sugars 80-120. Has had hypoglycemia twice in 3 months. Checking feet daily. Eating healthy. Unable to exercise. Has some neuropathy. No nephropathy. Medications: Taking farxiga 10 mg, amaryl 4 mg bid, kerendia 10 mg qd, metformin 1000 mg bid, actos 30 mg daily, ozempic 1.0 mg weekly, tresiba 30 U daily. Hyperlipidemia: on pravastatin 40 mg HTN-Ramipril 2.5 mg daily, aldactone 25  mg daily Chronic Back Pain: ON tramadol. Sees pain clinic. Prevents her from exercising.   Current Outpatient Medications on File Prior to Visit  Medication Sig Dispense Refill   albuterol (VENTOLIN HFA) 108 (90 Base) MCG/ACT inhaler Inhale 2 puffs into the lungs every 6 (six) hours as needed for wheezing or shortness of breath. 8 g 2   dapagliflozin propanediol (FARXIGA) 10 MG TABS tablet Take 1 tablet (10 mg total) by mouth daily before breakfast. 90 tablet 1   Finerenone (KERENDIA) 10 MG TABS Take 10 mg by mouth daily. 30 tablet 2   glimepiride (AMARYL) 4 MG tablet TAKE TWO TABLETS BY MOUTH BEFORE BREAKFAST 90 tablet 1   glucose blood (RELION GLUCOSE TEST STRIPS) test strip Use as instructed 100 each 2   ibuprofen (ADVIL) 800 MG tablet Take 1 tablet (800 mg total) by mouth every 8 (eight) hours as needed. 30 tablet 0   insulin degludec (TRESIBA FLEXTOUCH) 200 UNIT/ML FlexTouch Pen Inject 30 Units into the skin daily. 15 mL 0   metFORMIN (GLUCOPHAGE) 1000 MG tablet TAKE ONE TABLET BY MOUTH TWICE DAILY WITH A MEAL 180 tablet 1   Multiple Vitamin (MULTIVITAMIN) tablet Take 1 tablet by mouth daily.     pioglitazone (ACTOS) 30 MG tablet Take 1 tablet (30 mg total) by mouth daily. 90 tablet 1   pravastatin (PRAVACHOL) 40 MG tablet TAKE ONE TABLET BY MOUTH EVERYDAY AT BEDTIME 90 tablet 1    ramipril (ALTACE) 2.5 MG capsule Take 1 capsule (2.5 mg total) by mouth daily. 90 capsule 1   Semaglutide,0.25 or 0.'5MG'$ /DOS, (OZEMPIC, 0.25 OR 0.5 MG/DOSE,) 2 MG/1.5ML SOPN Inject 0.5 mg into the skin once a week. (Patient taking differently: Inject 1 mg into the skin once a week. Increasing to 0.5 mg weekly this week.) 1.5 mL 2   traMADol (ULTRAM) 50 MG tablet Take 1 - 2 tablets every 6 hours as needed for pain     Current Facility-Administered Medications on File Prior to Visit  Medication Dose Route Frequency Provider Last Rate Last Admin   ipratropium-albuterol (DUONEB) 0.5-2.5 (3) MG/3ML nebulizer solution 3 mL  3 mL Nebulization Once Rip Harbour, NP       Past Medical History:  Diagnosis Date   Ankle pain    Chronic pain    back   COPD (chronic obstructive pulmonary disease) ([place])    Depression    Diabetes mellitus    GERD (gastroesophageal reflux disease)    Hyperlipidemia    Hypertension    Low back pain    Obstructive sleep apnea    Pneumonia 2010   Post-menopausal atrophic vaginitis    Superficial phlebitis    Past Surgical History:  Procedure Laterality Date   BACK SURGERY  1990   CESAREAN SECTION     CYST REMOVAL HAND     inner thigh   SPINE  SURGERY     lumbar laminectomy and diskectomy    Family History  Problem Relation Age of Onset   Diabetes Mother    Heart disease Mother    Cancer Father    Stroke Sister    Social History   Socioeconomic History   Marital status: Single    Spouse name: Not on file   Number of children: Not on file   Years of education: Not on file   Highest education level: Not on file  Occupational History   Not on file  Tobacco Use   Smoking status: Former    Types: Cigarettes    Quit date: 03/20/2009    Years since quitting: 12.1   Smokeless tobacco: Never  Vaping Use   Vaping Use: Never used  Substance and Sexual Activity   Alcohol use: No   Drug use: No   Sexual activity: Not on file  Other Topics Concern   Not  on file  Social History Narrative   Not on file   Social Determinants of Health   Financial Resource Strain: Not on file  Food Insecurity: Unknown   Worried About [place] in the Last Year: Not on file   [employer] of Food in the Last Year: Never true  Transportation Needs: No Transportation Needs   Lack of Transportation (Medical): No   Lack of Transportation (Non-Medical): No  Physical Activity: Not on file  Stress: Not on file  Social Connections: Not on file    Review of Systems  Constitutional:  Positive for fatigue. Negative for chills and fever.  HENT:  Negative for congestion, ear pain, rhinorrhea and sore throat.   Respiratory:  Negative for cough and shortness of breath.   Cardiovascular:  Negative for chest pain.  Gastrointestinal:  Positive for diarrhea and nausea. Negative for abdominal pain, constipation and vomiting.  Endocrine: Negative for polydipsia and polyphagia.  Genitourinary:  Negative for dysuria and urgency.  Musculoskeletal:  Positive for back pain. Negative for myalgias.       Painful knot on the back of her rt heel. She does not recall any injury, but the area seems to be getting larger.   Neurological:  Negative for dizziness, weakness, light-headedness and headaches.  Psychiatric/Behavioral:  Negative for dysphoric mood. The patient is not nervous/anxious.     Objective:  BP 124/70   Pulse 84   Temp (!) 97.4 F (36.3 C)   Resp 18   Ht '5\' 3"'$  (1.6 m)   Wt 240 lb 3.2 oz (109 kg)   BMI 42.55 kg/m   BP/Weight 05/18/2021 04/20/2021 0000000  Systolic BP [id number]  Diastolic BP 70 68 64  Wt. (Lbs) 240.2 241 255  BMI 42.55 42.69 45.17    Physical Exam Vitals reviewed.  Constitutional:      Appearance: Normal appearance. She is normal weight.  Neck:     Vascular: No carotid bruit.  Cardiovascular:     Rate and Rhythm: Normal rate and regular rhythm.     Pulses: Normal pulses.     Heart sounds: Normal heart sounds.  Pulmonary:      Effort: Pulmonary effort is normal. No respiratory distress.     Breath sounds: Normal breath sounds.  Abdominal:     General: Abdomen is flat. Bowel sounds are normal.     Palpations: Abdomen is soft.     Tenderness: There is no abdominal tenderness.  Musculoskeletal:        General: Tenderness (lumbar)  and deformity (3-4 cm nodule on the posterior rt heel in the region of the achilles tendon. tender. no fluctuant.) present.  Neurological:     Mental Status: She is alert and oriented to person, place, and time.  Psychiatric:        Mood and Affect: Mood normal.        Behavior: Behavior normal.    Diabetic Foot Exam - Simple   No data filed      Lab Results  Component Value Date   WBC 9.8 05/18/2021   HGB 15.3 05/18/2021   HCT 46.3 05/18/2021   PLT 251 05/18/2021   GLUCOSE 97 05/18/2021   CHOL 134 05/18/2021   TRIG 80 05/18/2021   HDL 46 05/18/2021   LDLCALC 72 05/18/2021   ALT 20 05/18/2021   AST 12 05/18/2021   NA 141 05/18/2021   K 4.8 05/18/2021   CL 102 05/18/2021   CREATININE 0.68 05/18/2021   BUN 17 05/18/2021   CO2 23 05/18/2021   HGBA1C 6.9 (H) 05/18/2021   MICROALBUR 30 05/18/2021      Assessment & Plan:   1. Diabetic polyneuropathy associated with type 2 diabetes mellitus (HCC) Control: good. Recommend check sugars fasting daily. Recommend check feet daily. Recommend annual eye exams. Medicines: Continue current medications. When ozempic 2 mg weekly is available, I would increase dose and at that time I would hold his glimeperide. Continue to work on eating a healthy diet and exercise.  Labs drawn today.    - CBC with Differential/Platelet - Hemoglobin A1c - POCT UA - Microalbumin  2. Mixed hyperlipidemia  The current medical regimen is effective;  continue present plan and medications. Recommend continue to work on eating healthy diet and exercise.  - Lipid panel  3. Essential hypertension The current medical regimen is effective;   continue present plan and medications. Recommend continue to work on eating healthy diet and exercise.  - Comprehensive metabolic panel  4. Need for influenza vaccination - Flu Vaccine MDCK QUAD PF  5. Achilles tendon disorder, right - Ambulatory referral to Podiatry   6. Morbid obesity bmi 40-44. Recommend continue to work on eating healthy diet and exercise.  Orders Placed This Encounter  Procedures   Flu Vaccine MDCK QUAD PF   CBC with Differential/Platelet   Comprehensive metabolic panel   Hemoglobin A1c   Lipid panel   Cardiovascular Risk Assessment   Ambulatory referral to Podiatry   POCT UA - Microalbumin    Follow-up: Return in about 3 months (around 08/17/2021) for fasting.  An After Visit Summary was printed and given to the patient.  [name], MD [facility] [phone]

## 2021-05-18 ENCOUNTER — Ambulatory Visit (INDEPENDENT_AMBULATORY_CARE_PROVIDER_SITE_OTHER): Payer: Medicare HMO | Admitting: Family Medicine

## 2021-05-18 ENCOUNTER — Other Ambulatory Visit: Payer: Self-pay

## 2021-05-18 VITALS — BP 124/70 | HR 84 | Temp 97.4°F | Resp 18 | Ht 63.0 in | Wt 240.2 lb

## 2021-05-18 DIAGNOSIS — M67971 Unspecified disorder of synovium and tendon, right ankle and foot: Secondary | ICD-10-CM | POA: Diagnosis not present

## 2021-05-18 DIAGNOSIS — Z23 Encounter for immunization: Secondary | ICD-10-CM | POA: Diagnosis not present

## 2021-05-18 DIAGNOSIS — I10 Essential (primary) hypertension: Secondary | ICD-10-CM | POA: Diagnosis not present

## 2021-05-18 DIAGNOSIS — E782 Mixed hyperlipidemia: Secondary | ICD-10-CM | POA: Diagnosis not present

## 2021-05-18 DIAGNOSIS — E1169 Type 2 diabetes mellitus with other specified complication: Secondary | ICD-10-CM | POA: Diagnosis not present

## 2021-05-18 DIAGNOSIS — E1142 Type 2 diabetes mellitus with diabetic polyneuropathy: Secondary | ICD-10-CM | POA: Diagnosis not present

## 2021-05-18 LAB — POCT UA - MICROALBUMIN: Microalbumin Ur, POC: 30 mg/L

## 2021-05-18 NOTE — Patient Instructions (Addendum)
Diabetic recommendations: Visits with PCP every 3-6 months depending on Hemoglobin A1C control (goal is < 7.0.) Dietary avoidance/limitation of sugar and carbohydrates, fat, and salt. Exercise (aerobic) 3-5 days per week.  Kidney Care:  Urine microalbumin (protein) should be checked at least annually. To treat or prevent nephropathy (leaking of protein in urine) - all patients should be on an ACE or an ARB Medicines. Heart Disease Prevention: All diabetics should be on a statin cholesterol medicine regardless of cholesterol levels. If cholesterol levels are high, recommend treat to goal of LDL cholesterol level of less than 70. Diabetic Eye Care: See an eye doctor at least annually. More if recommended by the eye doctor.  Foot Care: Check feet visually daily. Blood pressure (hypertension) control < 130/85 Vaccinations: annual flu shot, pneumovax 23, tetanus (tdap)   

## 2021-05-19 LAB — COMPREHENSIVE METABOLIC PANEL
ALT: 20 IU/L (ref 0–32)
AST: 12 IU/L (ref 0–40)
Albumin/Globulin Ratio: 1.9 (ref 1.2–2.2)
Albumin: 4.6 g/dL (ref 3.8–4.8)
Alkaline Phosphatase: 56 IU/L (ref 44–121)
BUN/Creatinine Ratio: 25 (ref 12–28)
BUN: 17 mg/dL (ref 8–27)
Bilirubin Total: 0.4 mg/dL (ref 0.0–1.2)
CO2: 23 mmol/L (ref 20–29)
Calcium: 9.6 mg/dL (ref 8.7–10.3)
Chloride: 102 mmol/L (ref 96–106)
Creatinine, Ser: 0.68 mg/dL (ref 0.57–1.00)
Globulin, Total: 2.4 g/dL (ref 1.5–4.5)
Glucose: 97 mg/dL (ref 65–99)
Potassium: 4.8 mmol/L (ref 3.5–5.2)
Sodium: 141 mmol/L (ref 134–144)
Total Protein: 7 g/dL (ref 6.0–8.5)
eGFR: 99 mL/min/{1.73_m2} (ref >59)

## 2021-05-19 LAB — CBC WITH DIFFERENTIAL/PLATELET
Basophils Absolute: 0 10*3/uL (ref 0.0–0.2)
Basos: 0 %
EOS (ABSOLUTE): 0.2 10*3/uL (ref 0.0–0.4)
Eos: 2 %
Hematocrit: 46.3 % (ref 34.0–46.6)
Hemoglobin: 15.3 g/dL (ref 11.1–15.9)
Immature Grans (Abs): 0 10*3/uL (ref 0.0–0.1)
Immature Granulocytes: 0 %
Lymphocytes Absolute: 2.8 10*3/uL (ref 0.7–3.1)
Lymphs: 29 %
MCH: 28.8 pg (ref 26.6–33.0)
MCHC: 33 g/dL (ref 31.5–35.7)
MCV: 87 fL (ref 79–97)
Monocytes Absolute: 0.8 10*3/uL (ref 0.1–0.9)
Monocytes: 8 %
Neutrophils Absolute: 5.9 10*3/uL (ref 1.4–7.0)
Neutrophils: 61 %
Platelets: 251 10*3/uL (ref 150–450)
RBC: 5.32 x10E6/uL — ABNORMAL HIGH (ref 3.77–5.28)
RDW: 14.1 % (ref 11.7–15.4)
WBC: 9.8 10*3/uL (ref 3.4–10.8)

## 2021-05-19 LAB — HEMOGLOBIN A1C
Est. average glucose Bld gHb Est-mCnc: 151 mg/dL
Hgb A1c MFr Bld: 6.9 % — ABNORMAL HIGH (ref 4.8–5.6)

## 2021-05-19 LAB — LIPID PANEL
Chol/HDL Ratio: 2.9 ratio (ref 0.0–4.4)
Cholesterol, Total: 134 mg/dL (ref 100–199)
HDL: 46 mg/dL (ref >39)
LDL Chol Calc (NIH): 72 mg/dL (ref 0–99)
Triglycerides: 80 mg/dL (ref 0–149)
VLDL Cholesterol Cal: 16 mg/dL (ref 5–40)

## 2021-05-19 LAB — CARDIOVASCULAR RISK ASSESSMENT

## 2021-05-22 ENCOUNTER — Other Ambulatory Visit: Payer: Self-pay | Admitting: Family Medicine

## 2021-05-22 ENCOUNTER — Encounter: Payer: Self-pay | Admitting: Family Medicine

## 2021-05-23 ENCOUNTER — Telehealth: Payer: Self-pay

## 2021-05-23 DIAGNOSIS — M67971 Unspecified disorder of synovium and tendon, right ankle and foot: Secondary | ICD-10-CM | POA: Insufficient documentation

## 2021-05-23 DIAGNOSIS — E1142 Type 2 diabetes mellitus with diabetic polyneuropathy: Secondary | ICD-10-CM | POA: Insufficient documentation

## 2021-05-23 NOTE — Assessment & Plan Note (Signed)
-   Ambulatory referral to Podiatry

## 2021-05-23 NOTE — Assessment & Plan Note (Signed)
The current medical regimen is effective;  continue present plan and medications. Recommend continue to work on eating healthy diet and exercise.  

## 2021-05-23 NOTE — Addendum Note (Signed)
Addended by: Donette Larry B on: 05/23/2021 11:10 AM   Modules accepted: Orders

## 2021-05-23 NOTE — Telephone Encounter (Signed)
-----   Message from [name], MD sent at 05/22/2021 11:57 PM EDT ----- Please clarify how much ozempic Aston was taking when she came in last week. I am confused as to if it was 0.5 mg weekly or 1 mg weekly.  Please clarify with her that I would like her to hold her glimeperide, decrease tresiba to 20 U daily and increase her ozempic to either 1 mg if on 0.5 or 2 mg if on 1 mg weekly.  Please ask me if you have any questions. Thank you.

## 2021-05-23 NOTE — Assessment & Plan Note (Signed)
Recommend continue to work on eating healthy diet and exercise.  

## 2021-05-23 NOTE — Telephone Encounter (Signed)
  Chronic Care Management   Note  05/23/2021 Name: Brandi Velazquez MRN: [id number] DOB: June 11, 1961  Brandi Velazquez is currently using Ozempic 1 mg weekly. We discussed goals of continue reducing insulin and discontinuing glimepiride with improved blood sugar. Brandi Velazquez has lost 15 pounds and is very pleased.   Reviewed patient's lab results and Dr. Alyse Low recommendation to increase Ozempic 2 mg weekly and decrease Tresiba to 20 units daily. Patient agrees to this and will begin once Ozempic 2 mg dose available through [facility].   Pharmacist requested refill from [facility]. Ozempic has been on backorder but should be available soon through [facility] Patient will continue Ozempic 1 mg and Tresiba 30 units until supply arrives in office.   Sherre Poot, PharmD, [facility] Clinical Pharmacist [name] [phone] (office) [phone] (mobile)

## 2021-05-23 NOTE — Progress Notes (Signed)
Patient aware of recommendations and will begin Ozempic 2 mg weekly and Tresiba 20 units daily once arrives from [facility]. Zuleimy advised to hold glimepiride and call office with any concerns.

## 2021-05-23 NOTE — Assessment & Plan Note (Signed)
Control: good. Recommend check sugars fasting daily. Recommend check feet daily. Recommend annual eye exams. Medicines: Continue current medications. When ozempic 2 mg weekly is available at the pharmacy, I would increase dose and at that time I would hold his glimeperide. Continue to work on eating a healthy diet and exercise.

## 2021-05-30 ENCOUNTER — Other Ambulatory Visit: Payer: Self-pay

## 2021-05-30 ENCOUNTER — Telehealth: Payer: Self-pay

## 2021-05-30 DIAGNOSIS — E1142 Type 2 diabetes mellitus with diabetic polyneuropathy: Secondary | ICD-10-CM

## 2021-05-30 MED ORDER — DAPAGLIFLOZIN PROPANEDIOL 10 MG PO TABS
10.0000 mg | ORAL_TABLET | Freq: Every day | ORAL | 1 refills | Status: DC
Start: 2021-05-30 — End: 2021-10-02

## 2021-05-30 NOTE — Chronic Care Management (AMB) (Signed)
Chronic Care Management Pharmacy Assistant   Name: Brandi Velazquez  MRN: [id number] DOB: 1960-10-17  Reason for Encounter: Medication Review   Recent office visits:  None  Recent consult visits:  None  Hospital visits:  None in previous 6 months  Reviewed chart for medication changes ahead of medication coordination call.  No OVs, Consults, or hospital visits since last care coordination call/Pharmacist visit.   Medication changes indicated: Ozempic changed from 1 mg to 2 mg into skin once a week,Tresiba decreased from 30 units to 20 units into skin once daily. Finerenone 10 mg and Glimepiride 4 mg discontinued.   Per Telephone Encounter 05/23/2021 with Donette Larry, Pharm D. - Reviewed patient's lab results and Dr. Alyse Low recommendation to increase Ozempic 2 mg weekly and decrease Tresiba to 20 units daily. Patient agrees to this and will begin once Ozempic 2 mg dose available through [facility].   BP Readings from Last 3 Encounters:  05/18/21 124/70  04/20/21 101/68  02/09/21 112/64    Lab Results  Component Value Date   HGBA1C 6.9 (H) 05/18/2021     Patient obtains medications through Adherence Packaging  30 Days   Last adherence delivery included:  Pravastatin 40 mg- 1 tablet daily (bedtime) Glimepiride 4 mg- 2 tablets daily (before breakfast) Pioglitazone 30 mg- 1 tablet daily  (before breakfast) Spironolactone 25 mg- 1 tablet daily (before breakfast) Metformin 1000 mg- 1 tablet twice daily (breakfast, evening meal) Ramipril 2.5 mg-1 tablet daily (breakfast)  Patient declined the following medication last month: None  Patient is due for next adherence delivery on: 06/07/2021. Called patient and reviewed medications and coordinated delivery.  This delivery to include: Pravastatin 40 mg- 1 tablet daily (bedtime) Glimepiride 4 mg- 2 tablets daily (before breakfast) Pioglitazone 30 mg- 1 tablet daily  (before breakfast) Spironolactone 25 mg- 1 tablet daily (before  breakfast) Metformin 1000 mg- 1 tablet twice daily (breakfast, evening meal) Ramipril 2.5 mg-1 tablet daily (breakfast)   No short fill or acute fill needed.  Patient declined the following medications: Farxiga 10 mg- 1 tablet daily (before breakfast)- Patient received 90 day supply of medication on 05/30/2021. Will be added to packs in December 2022.   Patient needs refills for: None.  Confirmed delivery date of 06/07/2021, advised patient that pharmacy will contact them the morning of delivery.  Medications: Outpatient Encounter Medications as of 05/30/2021  Medication Sig Note   albuterol (VENTOLIN HFA) 108 (90 Base) MCG/ACT inhaler Inhale 2 puffs into the lungs every 6 (six) hours as needed for wheezing or shortness of breath.    dapagliflozin propanediol (FARXIGA) 10 MG TABS tablet Take 1 tablet (10 mg total) by mouth daily before breakfast.    glucose blood (RELION GLUCOSE TEST STRIPS) test strip Use as instructed    ibuprofen (ADVIL) 800 MG tablet Take 1 tablet (800 mg total) by mouth every 8 (eight) hours as needed.    insulin degludec (TRESIBA FLEXTOUCH) 200 UNIT/ML FlexTouch Pen Inject 30 Units into the skin daily. (Patient taking differently: Inject 20 Units into the skin daily.) 05/23/2021: Per Dr. Alyse Low recommendation patient is to decrease to 20 units daily when beginning Ozempic 2 mg weekly   metFORMIN (GLUCOPHAGE) 1000 MG tablet TAKE ONE TABLET BY MOUTH TWICE DAILY WITH A MEAL    Multiple Vitamin (MULTIVITAMIN) tablet Take 1 tablet by mouth daily.    pioglitazone (ACTOS) 30 MG tablet Take 1 tablet (30 mg total) by mouth daily.    pravastatin (PRAVACHOL) 40 MG tablet  TAKE ONE TABLET BY MOUTH EVERYDAY AT BEDTIME    ramipril (ALTACE) 2.5 MG capsule Take 1 capsule (2.5 mg total) by mouth daily.    Semaglutide (OZEMPIC, 2 MG/DOSE, [place]) Inject 2 mg into the skin once a week.    spironolactone (ALDACTONE) 25 MG tablet TAKE ONE TABLET BY MOUTH BEFORE BREAKFAST    traMADol (ULTRAM) 50  MG tablet Take 1 - 2 tablets every 6 hours as needed for pain    Facility-Administered Encounter Medications as of 05/30/2021  Medication   ipratropium-albuterol (DUONEB) 0.5-2.5 (3) MG/3ML nebulizer solution 3 mL    Care Gaps: DEXA SCAN- Overdue - never done  HIV Screening (Once) - Never done Hepatitis C Screening (Once)- Never done Zoster Vaccines- Shingrix (1 of 2)- Never done PAP SMEAR-Modifier (Every 3 Years)- never done COVID-19 Vaccine (4 - Booster for Moderna series)- Last completed: Feb 09, 2021  Star Rating Drugs: Ozempic- Patient Assistance Actos 30 mg- Last filled 05/02/2021 for 30 DS at [place] Pravastatin 40 mg- Last filled 05/02/2021 for 30 DS at [place] Metformin 1000 mg- Last filled 05/02/2021 for 30 DS at [place] 10 mg- Last filled 05/30/2021 Glimepiride - Last filled 05/02/2021 for 30 DS at [facility]- Patient will discontinue once PAP arrives Ramipril 2.5 mg- Last filled 05/02/2021 for 30 DS at [place], [place] Pharmacist Assistant [phone]

## 2021-05-31 NOTE — Telephone Encounter (Signed)
Asked concierge to clarify Brandi Velazquez refill status

## 2021-06-01 ENCOUNTER — Ambulatory Visit (INDEPENDENT_AMBULATORY_CARE_PROVIDER_SITE_OTHER): Payer: Medicare HMO

## 2021-06-01 ENCOUNTER — Other Ambulatory Visit: Payer: Self-pay

## 2021-06-01 DIAGNOSIS — E1169 Type 2 diabetes mellitus with other specified complication: Secondary | ICD-10-CM

## 2021-06-01 DIAGNOSIS — E11628 Type 2 diabetes mellitus with other skin complications: Secondary | ICD-10-CM

## 2021-06-01 DIAGNOSIS — I10 Essential (primary) hypertension: Secondary | ICD-10-CM

## 2021-06-01 DIAGNOSIS — E782 Mixed hyperlipidemia: Secondary | ICD-10-CM

## 2021-06-01 NOTE — Patient Instructions (Signed)
Visit Information   Goals Addressed   None    Patient Care Plan: CCM Pharmacy Care Plan     Problem Identified: htn, hld, dm   Priority: High  Onset Date: 12/07/2020     Long-Range Goal: Disease Management   Start Date: 12/07/2020  Expected End Date: 12/08/2021  Recent Progress: On track  Priority: High  Note:   Current Barriers:  Unable to independently afford treatment regimen Unable to achieve control of blood sugar    Pharmacist Clinical Goal(s):  Patient will verbalize ability to afford treatment regimen achieve control of diabetes as evidenced by blood sugar through collaboration with PharmD and provider.   Interventions: 1:1 collaboration with [name], [name], MD regarding development and update of comprehensive plan of care as evidenced by provider attestation and co-signature Inter-disciplinary care team collaboration (see longitudinal plan of care) Comprehensive medication review performed; medication list updated in electronic medical record  Hypertension (BP goal <130/80) BP Readings from Last 3 Encounters:  05/18/21 124/70  04/20/21 101/68  02/09/21 112/64  -Controlled -Current treatment: ramipril 2.5 mg daily  Spironolactone 25 mg daily  -Medications previously tried: none reported  -Current home readings: well controlled per patient  -Current dietary habits: has had very little appetite since recovering from pneumonia -Current exercise habits: limited due to mobility -Denies hypotensive/hypertensive symptoms -Educated on BP goals and benefits of medications for prevention of heart attack, stroke and kidney damage; Daily salt intake goal < 2300 mg; Exercise goal of 150 minutes per week; -Counseled to monitor BP at home weekly, document, and provide log at future appointments -Counseled on diet and exercise extensively and encouraged to resume PT.  Recommended to continue current medication  Hyperlipidemia: (LDL goal < 70) Lab Results  Component Value  Date   CHOL 134 05/18/2021   CHOL 120 02/09/2021   CHOL 159 10/27/2020   Lab Results  Component Value Date   HDL 46 05/18/2021   HDL 47 02/09/2021   HDL 63 10/27/2020   Lab Results  Component Value Date   LDLCALC 72 05/18/2021   LDLCALC 58 02/09/2021   LDLCALC 82 10/27/2020   Lab Results  Component Value Date   TRIG 80 05/18/2021   TRIG 75 02/09/2021   TRIG 75 10/27/2020   Lab Results  Component Value Date   CHOLHDL 2.9 05/18/2021   CHOLHDL 2.6 02/09/2021   CHOLHDL 2.5 10/27/2020  No results found for: LDLDIRECT -Controlled -Current treatment: pravastatin 40 mg at bedtime  -Medications previously tried: none reported  -Current dietary patterns: limited appetite lately since pneumonia -Current exercise habits: limited due to mobility -Educated on Cholesterol goals;  Benefits of statin for ASCVD risk reduction; Importance of limiting foods high in cholesterol; Exercise goal of 150 minutes per week; -Counseled on diet and exercise extensively Recommended to continue current medication and consider change in statin after next blood work if LDL above goal.   Diabetes (A1c goal <7%) Lab Results  Component Value Date   HGBA1C 6.9 (H) 05/18/2021   HGBA1C 8.6 (H) 02/09/2021   HGBA1C 8.0 (H) 10/27/2020   Lab Results  Component Value Date   MICROALBUR 30 05/18/2021   LDLCALC 72 05/18/2021   CREATININE 0.68 05/18/2021   Lab Results  Component Value Date   NA 141 05/18/2021   K 4.8 05/18/2021   CREATININE 0.68 05/18/2021   GFRNONAA 91 10/27/2020   GFRAA 105 10/27/2020   GLUCOSE 97 05/18/2021   Lab Results  Component Value Date   WBC 9.8 05/18/2021  HGB 15.3 05/18/2021   HCT 46.3 05/18/2021   MCV 87 05/18/2021   PLT 251 05/18/2021  -Controlled -Current medications: Ozmepic 0.5 mg weekly for 4 weeks then 1 mg weekly if tolerating well Tresiba 30 units  Actos 30 mg daily  Glimepiride 4 mg daily with breakfast [employer] testing supplies  Metformin 1000 mg  bid  Farxiga 5 mg daily  -Medications previously tried: 70/30 insulin   -Current home glucose readings fasting glucose: unable to check currently  -Denies hypoglycemic/hyperglycemic symptoms -Current meal patterns:  Patient doesn't snack. Eats 2-3 meals daily.  -Current exercise: limited due to mobility - seeing vein specialist soon -Educated on A1c and blood sugar goals; Complications of diabetes including kidney damage, retinal damage, and cardiovascular disease; Exercise goal of 150 minutes per week; Benefits of weight loss; Benefits of routine self-monitoring of blood sugar; Carbohydrate counting and/or plate method -Counseled to check feet daily and get yearly eye exams -Counseled on diet and exercise extensively. Pharmacist will review upcoming blood work. Goal to stop glimepiride, pioglitazone and increase Ozempic to 1 mg weekly. Goal to maximize Farxiga for kidney protection. Unable to do so today due to back-order of Ozempic   Patient Goals/Self-Care Activities Patient will:  - take medications as prescribed focus on medication adherence by using pill box check glucose daily, document, and provide at future appointments target a minimum of 150 minutes of moderate intensity exercise weekly engage in dietary modifications by limiting carbohydrates and balancing meals using plate method.   Follow Up Plan: Telephone follow up appointment with care management team member scheduled for: March 2023      The patient verbalized understanding of instructions, educational materials, and care plan provided today and declined offer to receive copy of patient instructions, educational materials, and care plan.  The pharmacy team will reach out to the patient again over the next 90 days.   Lane Hacker, [facility]

## 2021-06-01 NOTE — Progress Notes (Signed)
Chronic Care Management Pharmacy Note  06/01/2021 Name:  Brandi Velazquez MRN:  [id number] DOB:  01-17-1961    Summary:  Pleasant 60 year old female presents for f/u CCM visit. Her father was in the [place] so she's travelled all across the country but [place] is her favourite. She has 1 daughter and 82 year old twin grandkids. They all live with her. Patient works part-time with mentally handicapped during the week and the daughter works 12 hours at the hospital on the weekend  Plan Recommendations:  Unable to get Ozempic due to back-order Most likely resolution will be to optimize therapy in Jan when we can get meds AWV needed, forwarded to nursing   Subjective: Brandi Velazquez is an 60 y.o. year old female who is a primary patient of [name], Kirsten, MD.  The CCM team was consulted for assistance with disease management and care coordination needs.    Engaged with patient by telephone for follow up visit in response to provider referral for pharmacy case management and/or care coordination services.   Consent to Services:  The patient was given information about Chronic Care Management services, agreed to services, and gave verbal consent prior to initiation of services.  Please see initial visit note for detailed documentation.   Patient Care Team: [name], MD as PCP - General (Family Medicine) Burnice Logan, [facility] (Inactive) as Pharmacist (Pharmacist)  Recent office visits: 11/21/2020 - cough - azithromycin, benzonotate and duoneb.  10/27/2020 -  Continue current medications.   Recent consult visits: None since last visit  Hospital visits: None in previous 6 months  Objective:  Lab Results  Component Value Date   CREATININE 0.68 05/18/2021   BUN 17 05/18/2021   GFRNONAA 91 10/27/2020   GFRAA 105 10/27/2020   NA 141 05/18/2021   K 4.8 05/18/2021   CALCIUM 9.6 05/18/2021   CO2 23 05/18/2021   GLUCOSE 97 05/18/2021    Lab Results  Component Value Date/Time   HGBA1C  6.9 (H) 05/18/2021 11:10 AM   HGBA1C 8.6 (H) 02/09/2021 01:18 PM   MICROALBUR 30 05/18/2021 10:31 AM   MICROALBUR 30 07/24/2020 11:45 PM    Last diabetic Eye exam:  Lab Results  Component Value Date/Time   HMDIABEYEEXA No Retinopathy 03/07/2021 12:00 AM    Last diabetic Foot exam: No results found for: HMDIABFOOTEX   Lab Results  Component Value Date   CHOL 134 05/18/2021   HDL 46 05/18/2021   LDLCALC 72 05/18/2021   TRIG 80 05/18/2021   CHOLHDL 2.9 05/18/2021    Hepatic Function Latest Ref Rng & Units 05/18/2021 02/09/2021 10/27/2020  Total Protein 6.0 - 8.5 g/dL 7.0 6.7 6.7  Albumin 3.8 - 4.8 g/dL 4.6 4.3 4.3  AST 0 - 40 IU/L 12 13 14   ALT 0 - 32 IU/L 20 21 20   Alk Phosphatase 44 - 121 IU/L 56 57 55  Total Bilirubin 0.0 - 1.2 mg/dL 0.4 0.5 0.3    No results found for: TSH, FREET4  CBC Latest Ref Rng & Units 05/18/2021 02/09/2021 10/27/2020  WBC 3.4 - 10.8 x10E3/uL 9.8 8.5 13.0(H)  Hemoglobin 11.1 - 15.9 g/dL 15.3 14.8 14.9  Hematocrit 34.0 - 46.6 % 46.3 44.8 45.6  Platelets 150 - 450 x10E3/uL 251 235 252    No results found for: VD25OH  Clinical ASCVD: No  The 10-year ASCVD risk score (Arnett DK, et al., 2019) is: 7.3%   Values used to calculate the score:     Age:  54 years     Sex: Female     Is Non-Hispanic African American: No     Diabetic: Yes     Tobacco smoker: No     Systolic Blood Pressure: 419 mmHg     Is BP treated: Yes     HDL Cholesterol: 46 mg/dL     Total Cholesterol: 134 mg/dL    Depression screen [facility] 2/9 07/18/2020 07/13/2016  Decreased Interest 0 0  Down, Depressed, Hopeless 0 0  PHQ - 2 Score 0 0     Social History   Tobacco Use  Smoking Status Former   Types: Cigarettes   Quit date: 03/20/2009   Years since quitting: 12.2  Smokeless Tobacco Never   BP Readings from Last 3 Encounters:  05/18/21 124/70  04/20/21 101/68  02/09/21 112/64   Pulse Readings from Last 3 Encounters:  05/18/21 84  04/20/21 87  02/09/21 84   Wt Readings  from Last 3 Encounters:  05/18/21 240 lb 3.2 oz (109 kg)  04/20/21 241 lb (109.3 kg)  02/09/21 255 lb (115.7 kg)   BMI Readings from Last 3 Encounters:  05/18/21 42.55 kg/m  04/20/21 42.69 kg/m  02/09/21 45.17 kg/m    Assessment/Interventions: Review of patient past medical history, allergies, medications, health status, including review of consultants reports, laboratory and other test data, was performed as part of comprehensive evaluation and provision of chronic care management services.   SDOH:  (Social Determinants of Health) assessments and interventions performed: Yes   CCM Care Plan  Allergies  Allergen Reactions   Bactrim [Sulfamethoxazole-Trimethoprim] Swelling   Morphine And Related     Medications Reviewed Today     Reviewed by Lane Hacker, [facility] (Pharmacist) on 06/01/21 at 1135  Med List Status: <None>   Medication Order Taking? Sig Documenting Provider Last Dose Status Informant  albuterol (VENTOLIN HFA) 108 (90 Base) MCG/ACT inhaler [id number] Yes Inhale 2 puffs into the lungs every 6 (six) hours as needed for wheezing or shortness of breath. [name], Kirsten, MD Taking Active   dapagliflozin propanediol (FARXIGA) 10 MG TABS tablet [id number] Yes Take 1 tablet (10 mg total) by mouth daily before breakfast. [name], Kirsten, MD Taking Active   glucose blood (RELION GLUCOSE TEST STRIPS) test strip [id number]  Use as instructed [name], [name], MD  Active   ibuprofen (ADVIL) 800 MG tablet [id number]  Take 1 tablet (800 mg total) by mouth every 8 (eight) hours as needed. [name], Kirsten, MD  Active   insulin degludec (TRESIBA FLEXTOUCH) 200 UNIT/ML FlexTouch Pen [id number] Yes Inject 30 Units into the skin daily.  Patient taking differently: Inject 20 Units into the skin daily.   [name], Kirsten, MD Taking Active            Med Note [place], [place] May 23, 2021 11:10 AM) Per Dr. Alyse Low recommendation patient is to decrease to 20 units daily when beginning Ozempic 2 mg weekly   ipratropium-albuterol (DUONEB) 0.5-2.5 (3) MG/3ML nebulizer solution 3 mL [id number]   Rip Harbour, NP  Active   metFORMIN (GLUCOPHAGE) 1000 MG tablet [id number] Yes TAKE ONE TABLET BY MOUTH TWICE DAILY WITH A MEAL Rip Harbour, NP Taking Active   Multiple Vitamin (MULTIVITAMIN) tablet [id number]  Take 1 tablet by mouth daily. [provider]  Active   pioglitazone (ACTOS) 30 MG tablet [id number] Yes Take 1 tablet (30 mg total) by mouth daily. [name], MD Taking Active   pravastatin (PRAVACHOL) 40 MG tablet [id number] Yes  TAKE ONE TABLET BY MOUTH EVERYDAY AT BEDTIME [name], Kirsten, MD Taking Active   ramipril (ALTACE) 2.5 MG capsule [id number] Yes Take 1 capsule (2.5 mg total) by mouth daily. [name], Kirsten, MD Taking Active   Semaglutide [facility], 2 MG/DOSE, [place]) [id number] No Inject 2 mg into the skin once a week.  Patient not taking: Reported on 06/01/2021   [provider] Not Taking Active   spironolactone (ALDACTONE) 25 MG tablet [id number] Yes TAKE ONE TABLET BY MOUTH BEFORE BREAKFAST [name], Kirsten, MD Taking Active   traMADol (ULTRAM) 50 MG tablet [id number]  Take 1 - 2 tablets every 6 hours as needed for pain [provider]  Active             Patient Active Problem List   Diagnosis Date Noted   Diabetic polyneuropathy associated with type 2 diabetes mellitus ([place]) 05/23/2021   Achilles tendon disorder, right 05/23/2021   Morbid obesity ([place]) 10/30/2020   Diabetes mellitus ([place]) 04/17/2020   Hyperlipidemia    Essential hypertension    GERD (gastroesophageal reflux disease)    Chronic midline low back pain without sciatica 01/10/2020   Back pain of lumbar region with sciatica 01/07/2020   Other acute recurrent sinusitis 10/29/2019   Cough 10/29/2019   Varicose veins of lower extremities with other complications 88/50/2774   Varicose veins of lower extremities with inflammation 06/14/2011    Immunization History  Administered Date(s)  Administered   Influenza Inj Mdck Quad Pf 07/18/2020, 05/18/2021   Moderna SARS-COV2 Booster Vaccination 02/09/2021   Moderna Sars-Covid-2 Vaccination 10/07/2019, 11/02/2019   Pneumococcal Polysaccharide-23 10/09/2011   Tdap 05/15/2018    Conditions to be addressed/monitored:  Hyperlipidemia and Diabetes  Care Plan : [place]  Updates made by Lane Hacker, [place] since 06/01/2021 12:00 AM     Problem: htn, hld, dm   Priority: High  Onset Date: 12/07/2020     Long-Range Goal: Disease Management   Start Date: 12/07/2020  Expected End Date: 12/08/2021  Recent Progress: On track  Priority: High  Note:   Current Barriers:  Unable to independently afford treatment regimen Unable to achieve control of blood sugar    Pharmacist Clinical Goal(s):  Patient will verbalize ability to afford treatment regimen achieve control of diabetes as evidenced by blood sugar through collaboration with PharmD and provider.   Interventions: 1:1 collaboration with [name], [name], MD regarding development and update of comprehensive plan of care as evidenced by provider attestation and co-signature Inter-disciplinary care team collaboration (see longitudinal plan of care) Comprehensive medication review performed; medication list updated in electronic medical record  Hypertension (BP goal <130/80) BP Readings from Last 3 Encounters:  05/18/21 124/70  04/20/21 101/68  02/09/21 112/64  -Controlled -Current treatment: ramipril 2.5 mg daily  Spironolactone 25 mg daily  -Medications previously tried: none reported  -Current home readings: well controlled per patient  -Current dietary habits: has had very little appetite since recovering from pneumonia -Current exercise habits: limited due to mobility -Denies hypotensive/hypertensive symptoms -Educated on BP goals and benefits of medications for prevention of heart attack, stroke and kidney damage; Daily salt intake goal < 2300  mg; Exercise goal of 150 minutes per week; -Counseled to monitor BP at home weekly, document, and provide log at future appointments -Counseled on diet and exercise extensively and encouraged to resume PT.  Recommended to continue current medication  Hyperlipidemia: (LDL goal < 70) Lab Results  Component Value Date   CHOL 134 05/18/2021   CHOL 120  02/09/2021   CHOL 159 10/27/2020   Lab Results  Component Value Date   HDL 46 05/18/2021   HDL 47 02/09/2021   HDL 63 10/27/2020   Lab Results  Component Value Date   LDLCALC 72 05/18/2021   LDLCALC 58 02/09/2021   LDLCALC 82 10/27/2020   Lab Results  Component Value Date   TRIG 80 05/18/2021   TRIG 75 02/09/2021   TRIG 75 10/27/2020   Lab Results  Component Value Date   CHOLHDL 2.9 05/18/2021   CHOLHDL 2.6 02/09/2021   CHOLHDL 2.5 10/27/2020  No results found for: LDLDIRECT -Controlled -Current treatment: pravastatin 40 mg at bedtime  -Medications previously tried: none reported  -Current dietary patterns: limited appetite lately since pneumonia -Current exercise habits: limited due to mobility -Educated on Cholesterol goals;  Benefits of statin for ASCVD risk reduction; Importance of limiting foods high in cholesterol; Exercise goal of 150 minutes per week; -Counseled on diet and exercise extensively Recommended to continue current medication and consider change in statin after next blood work if LDL above goal.   Diabetes (A1c goal <7%) Lab Results  Component Value Date   HGBA1C 6.9 (H) 05/18/2021   HGBA1C 8.6 (H) 02/09/2021   HGBA1C 8.0 (H) 10/27/2020   Lab Results  Component Value Date   MICROALBUR 30 05/18/2021   LDLCALC 72 05/18/2021   CREATININE 0.68 05/18/2021   Lab Results  Component Value Date   NA 141 05/18/2021   K 4.8 05/18/2021   CREATININE 0.68 05/18/2021   GFRNONAA 91 10/27/2020   GFRAA 105 10/27/2020   GLUCOSE 97 05/18/2021   Lab Results  Component Value Date   WBC 9.8 05/18/2021    HGB 15.3 05/18/2021   HCT 46.3 05/18/2021   MCV 87 05/18/2021   PLT 251 05/18/2021  -Controlled -Current medications: Ozmepic 0.5 mg weekly for 4 weeks then 1 mg weekly if tolerating well Tresiba 30 units  Actos 30 mg daily  Glimepiride 4 mg daily with breakfast [employer]ion testing supplies  Metformin 1000 mg bid  Farxiga 5 mg daily  -Medications previously tried: 70/30 insulin   -Current home glucose readings fasting glucose: unable to check currently  -Denies hypoglycemic/hyperglycemic symptoms -Current meal patterns:  Patient doesn't snack. Eats 2-3 meals daily.  -Current exercise: limited due to mobility - seeing vein specialist soon -Educated on A1c and blood sugar goals; Complications of diabetes including kidney damage, retinal damage, and cardiovascular disease; Exercise goal of 150 minutes per week; Benefits of weight loss; Benefits of routine self-monitoring of blood sugar; Carbohydrate counting and/or plate method -Counseled to check feet daily and get yearly eye exams -Counseled on diet and exercise extensively. Pharmacist will review upcoming blood work. Goal to stop glimepiride, pioglitazone and increase Ozempic to 1 mg weekly. Goal to maximize Farxiga for kidney protection. Unable to do so today due to back-order of Ozempic   Patient Goals/Self-Care Activities Patient will:  - take medications as prescribed focus on medication adherence by using pill box check glucose daily, document, and provide at future appointments target a minimum of 150 minutes of moderate intensity exercise weekly engage in dietary modifications by limiting carbohydrates and balancing meals using plate method.   Follow Up Plan: Telephone follow up appointment with care management team member scheduled for: March 2023      Medication Assistance:  [name] and Ozempicobtained through [employer] medication assistance program.  Enrollment ends 08/16/2021  Patient's preferred pharmacy  is:  [place], [place] - [address] [place] [address] Phone: [phone] Fax: [phone]  [facility] - [place], [place] - [address] [place] [address] Phone: [phone] Fax: [place], [place] [place] [address] Phone: [phone] Fax: [phone]  Uses pill box? Yes Pt endorses good compliance  We discussed: Benefits of medication synchronization, packaging and delivery as well as enhanced pharmacist oversight with [facility]. Patient decided to: Utilize [facility] pharmacy for medication synchronization, packaging and delivery  Care Plan and Follow Up Patient Decision:  Patient agrees to Care Plan and Follow-up.  Plan: Telephone follow up appointment with care management team member scheduled for:  March 2023

## 2021-06-06 ENCOUNTER — Other Ambulatory Visit: Payer: Self-pay

## 2021-06-06 ENCOUNTER — Ambulatory Visit (INDEPENDENT_AMBULATORY_CARE_PROVIDER_SITE_OTHER): Payer: Medicare HMO | Admitting: Nurse Practitioner

## 2021-06-06 ENCOUNTER — Encounter: Payer: Self-pay | Admitting: Nurse Practitioner

## 2021-06-06 VITALS — BP 110/62 | HR 73 | Temp 98.0°F | Ht 63.0 in | Wt 235.0 lb

## 2021-06-06 DIAGNOSIS — N950 Postmenopausal bleeding: Secondary | ICD-10-CM

## 2021-06-06 DIAGNOSIS — Z124 Encounter for screening for malignant neoplasm of cervix: Secondary | ICD-10-CM

## 2021-06-06 DIAGNOSIS — N941 Unspecified dyspareunia: Secondary | ICD-10-CM | POA: Diagnosis not present

## 2021-06-06 DIAGNOSIS — N952 Postmenopausal atrophic vaginitis: Secondary | ICD-10-CM

## 2021-06-06 MED ORDER — ESTRADIOL 0.1 MG/GM VA CREA: TOPICAL_CREAM | VAGINAL | 12 refills | Status: DC

## 2021-06-06 NOTE — Patient Instructions (Addendum)
Recommend vaginal moisturizer 2-3 times weekly and vaginal lubricant with intercourse Begin Estrace vaginal cream, apply pea size amount nightly to vaginal area for 2 weeks, then twice weekly Return Thursday, Sept 22nd, 2022 a for [facility] Annual Wellness exam    Atrophic Vaginitis Atrophic vaginitis is when the lining of the vagina becomes dry and thin. This is most common in women who have stopped having their periods (are in menopause). It usually starts when a woman is 65 to 61 years old. What are the causes? This condition is caused by a drop in a female hormone (estrogen). What increases the risk? You are more likely to develop this condition if: You take certain medicines. You have had your ovaries taken out. You are being treated for cancer. You have given birth or are breastfeeding. You are more than 61 years old. You smoke. What are the signs or symptoms? Pain during sex. A feeling of pressure during sex. Bleeding during sex. Burning or itching in the vagina. Burning pain when you pee (urinate). Fluid coming from your vagina. Some people do not have symptoms. How is this treated? Using a lubricant before sex. Using a moisturizer in the vagina. Using estrogen in the vagina. In some cases, you may not need treatment. Follow these instructions at home: Medicines Take all medicines only as told by your doctor. This includes medicines for dryness. Do not use herbal medicines unless your doctor says it is okay. General instructions Talk with your doctor about treatment. Do not douche. Do not use scented: Sprays. Tampons. Soaps. If sex hurts, try using lubricants right before you have sex. Contact a doctor if: You have fluid coming from the vagina that is not like normal. You have a bad smell coming from your vagina. You have new symptoms. Your symptoms do not get better when treated. Your symptoms get worse. Summary This condition happens when the lining of the  vagina becomes dry and thin. It is most common in women who no longer have periods. Treatment may include using medicines for dryness. Call a doctor if your symptoms do not get better. This information is not intended to replace advice given to you by your health care provider. Make sure you discuss any questions you have with your health care provider. Document Revised: 03/03/2020 Document Reviewed: 03/03/2020 Elsevier Patient Education  2022 [employer].  Return on Thursday, Sept 22nd, 2022 for Medicare annual wellness exam We will call you with pap smear results    Dyspareunia, Female Dyspareunia is pain that is associated with sexual activity. This can affect any part of the genitals or lower abdomen. There are many possible causes of this condition. In some cases, diagnosing the cause of dyspareunia can be difficult. This condition can be mild, moderate, or severe. Depending on the cause, dyspareunia may get better with treatment, but may return (recur) over time. What are the causes? The cause of this condition is not always known. However, problems that affect the vulva, vagina, uterus, and other organs may cause dyspareunia. Common causes of this condition include: Severe pain and tenderness of the vulva when it is touched (vulvodynia). Vaginal dryness. Giving birth. Infection. Skin changes or conditions. Side effects of medicines. Endometriosis. This is when tissue that is like the lining of the uterus grows on the outside of the uterus. Psychological conditions. These include depression, anxiety, or traumatic experiences. Allergic reaction. What increases the risk? The following factors may make you more likely to develop this condition: History of physical or  sexual trauma. Some medicines. No longer having a monthly period (menopause). Having recently given birth. Taking baths using soaps that have perfumes. These can cause irritation. Douching. What are the signs or  symptoms? The main symptom of this condition is pain in any part of your genitals or lower abdomen during or after sex. This may include: Irritation, burning, or stinging sensations in your vulva. Discomfort when your vulva or surrounding area is touched. Aching and throbbing pain that may be constant. Pain that gets worse when something is inserted into your vagina. How is this diagnosed? This condition may be diagnosed based on: Your symptoms, including where and when your pain occurs. Your medical history. A physical exam. A pelvic exam will most likely be done. Tests that include ultrasound, blood tests, and tests that check the body for infection. Imaging tests, such as X-ray, MRI, and CT scan. You may be referred to a health care provider who specializes in women's health (gynecologist). How is this treated? Treatment depends on the cause of your condition and your symptoms. In most cases, you may need to stop sexual activity until your symptoms go away or get better. Treatment may include: Lubricants, ointments, and creams. Physical therapy. Massage therapy. Hormonal therapy. Medicines to: Prevent or fight infection. Relieve pain. Help numb the area. Treat depression (antidepressants). Counseling, which may include sex therapy. Surgery. Follow these instructions at home: Lifestyle Wear cotton underwear. Use water-based lubricants as needed during sex. Avoid oil-based lubricants. Do not use any products that can cause irritation. This may include certain condoms, spermicides, lubricants, soaps, tampons, vaginal sprays, or douches. Always practice safe sex. Use a condom to prevent sexually transmitted infections (STIs). Talk freely with your partner about your condition. General instructions Take or apply over-the-counter and prescription medicines only as told by your health care provider. Urinate before you have sex. Consider joining a support group. Get the results of  any tests you have done. Ask your health care provider, or the department that is doing the procedure, when your results will be ready. Keep all follow-up visits as told by your health care provider. This is important. Contact a health care provider if: You have vaginal bleeding after having sex. You develop a lump at the opening of your vagina even if the lump is painless. You have: Abnormal discharge from your vagina. Vaginal dryness. Itchiness or irritation of your vulva or vagina. A new rash. Symptoms that get worse or do not improve with treatment. A fever. Pain when you urinate. Blood in your urine. Get help right away if: You have severe pain in your abdomen during or shortly after sex. You pass out after sex. Summary Dyspareunia is pain that is associated with sexual activity. This can affect any part of the genitals or lower abdomen. There are many causes of this condition. Treatment depends on the cause and your symptoms. In most cases, you may need to stop sexual activity until your symptoms improve. Take or apply over-the-counter and prescription medicines only as told by your health care provider. Contact a health care provider if your symptoms get worse or do not improve with treatment. Keep all follow-up visits as told by your health care provider. This is important. This information is not intended to replace advice given to you by your health care provider. Make sure you discuss any questions you have with your health care provider. Document Revised: 10/15/2019 Document Reviewed: 11/10/2018 Elsevier Patient Education  2022 [place]. Postmenopausal Bleeding Postmenopausal bleeding is any  bleeding that occurs after menopause. Menopause is a time in a woman's life when monthly periods stop. Any type of bleeding after menopause should be checked by your doctor. Treatment will depend on the cause. This kind of bleeding can be caused by: Taking hormones during  menopause. Low or high amounts of female hormones in the body. This can cause the lining of the womb (uterus) to become too thin or too thick. Cancer. Growths in the womb that are not cancer. Follow these instructions at home:  Watch for any changes in your symptoms. Let your doctor know about them. Avoid using tampons and douches as told by your doctor. Change your pads regularly. Get regular pelvic exams. This includes Pap tests. Take iron pills as told by your doctor. Take over-the-counter and prescription medicines only as told by your doctor. Keep all follow-up visits. Contact a doctor if: You have new bleeding from the vagina after menopause. You have pain in your belly (abdomen). Get help right away if: You have a fever or chills. You have very bad pain with bleeding. You have clumps of blood (blood clots) coming from your vagina. You have a lot of bleeding, and: You use more than 1 pad an hour. This kind of bleeding has never happened before. You have headaches. You feel dizzy or you feel like you are going to pass out (faint). Summary Any type of bleeding after menopause should be checked by your doctor. Avoid using tampons or douches. Get regular pelvic exams. This includes Pap tests. Contact a doctor if you have new bleeding or pain in your belly. Watch for any changes in your symptoms. Let your doctor know about them. This information is not intended to replace advice given to you by your health care provider. Make sure you discuss any questions you have with your health care provider. Document Revised: 02/18/2020 Document Reviewed: 02/18/2020 Elsevier Patient Education  [place].

## 2021-06-06 NOTE — Progress Notes (Deleted)
Subjective:  Patient ID: Brandi Velazquez, female    DOB: 1961-09-01  Age: 60 y.o. MRN: [id number]  Chief Complaint  Patient presents with   Post-menopausal bleeding    HPI   Current Outpatient Medications on File Prior to Visit  Medication Sig Dispense Refill   albuterol (VENTOLIN HFA) 108 (90 Base) MCG/ACT inhaler Inhale 2 puffs into the lungs every 6 (six) hours as needed for wheezing or shortness of breath. 8 g 2   dapagliflozin propanediol (FARXIGA) 10 MG TABS tablet Take 1 tablet (10 mg total) by mouth daily before breakfast. 90 tablet 1   glucose blood (RELION GLUCOSE TEST STRIPS) test strip Use as instructed 100 each 2   ibuprofen (ADVIL) 800 MG tablet Take 1 tablet (800 mg total) by mouth every 8 (eight) hours as needed. 30 tablet 0   insulin degludec (TRESIBA FLEXTOUCH) 200 UNIT/ML FlexTouch Pen Inject 30 Units into the skin daily. (Patient taking differently: Inject 20 Units into the skin daily.) 15 mL 0   metFORMIN (GLUCOPHAGE) 1000 MG tablet TAKE ONE TABLET BY MOUTH TWICE DAILY WITH A MEAL 180 tablet 1   Multiple Vitamin (MULTIVITAMIN) tablet Take 1 tablet by mouth daily.     pioglitazone (ACTOS) 30 MG tablet Take 1 tablet (30 mg total) by mouth daily. 90 tablet 1   pravastatin (PRAVACHOL) 40 MG tablet TAKE ONE TABLET BY MOUTH EVERYDAY AT BEDTIME 90 tablet 1   ramipril (ALTACE) 2.5 MG capsule Take 1 capsule (2.5 mg total) by mouth daily. 90 capsule 1   Semaglutide (OZEMPIC, 2 MG/DOSE, [place]) Inject 2 mg into the skin once a week. (Patient not taking: Reported on 06/01/2021)     spironolactone (ALDACTONE) 25 MG tablet TAKE ONE TABLET BY MOUTH BEFORE BREAKFAST 90 tablet 1   traMADol (ULTRAM) 50 MG tablet Take 1 - 2 tablets every 6 hours as needed for pain     Current Facility-Administered Medications on File Prior to Visit  Medication Dose Route Frequency Provider Last Rate Last Admin   ipratropium-albuterol (DUONEB) 0.5-2.5 (3) MG/3ML nebulizer solution 3 mL  3 mL Nebulization  Once Rip Harbour, NP       Past Medical History:  Diagnosis Date   Ankle pain    Chronic pain    back   COPD (chronic obstructive pulmonary disease) (HCC)    Depression    Diabetes mellitus    GERD (gastroesophageal reflux disease)    Hyperlipidemia    Hypertension    Low back pain    Obstructive sleep apnea    Pneumonia 2010   Post-menopausal atrophic vaginitis    Superficial phlebitis    Past Surgical History:  Procedure Laterality Date   BACK SURGERY  1990   CESAREAN SECTION     CYST REMOVAL HAND     inner thigh   SPINE SURGERY     lumbar laminectomy and diskectomy    Family History  Problem Relation Age of Onset   Diabetes Mother    Heart disease Mother    Cancer Father    Stroke Sister    Social History   Socioeconomic History   Marital status: Single    Spouse name: Not on file   Number of children: Not on file   Years of education: Not on file   Highest education level: Not on file  Occupational History   Not on file  Tobacco Use   Smoking status: Former    Types: Cigarettes    Quit date: 03/20/2009  Years since quitting: 12.2   Smokeless tobacco: Never  Vaping Use   Vaping Use: Never used  Substance and Sexual Activity   Alcohol use: No   Drug use: No   Sexual activity: Not on file  Other Topics Concern   Not on file  Social History Narrative   Not on file   Social Determinants of Health   Financial Resource Strain: Not on file  Food Insecurity: Unknown   Worried About [place] in the Last Year: Not on file   Ran Out of Food in the Last Year: Never true  Transportation Needs: No Transportation Needs   Lack of Transportation (Medical): No   Lack of Transportation (Non-Medical): No  Physical Activity: Not on file  Stress: Not on file  Social Connections: Not on file    Review of Systems  Constitutional:  Negative for appetite change, fatigue and fever.  HENT:  Negative for congestion, ear pain, sinus pressure and  sore throat.   Eyes:  Negative for pain.  Respiratory:  Negative for cough, chest tightness, shortness of breath and wheezing.   Cardiovascular:  Negative for chest pain and palpitations.  Gastrointestinal:  Negative for abdominal pain, constipation, diarrhea, nausea and vomiting.  Genitourinary:  Negative for dysuria and hematuria.  Musculoskeletal:  Negative for arthralgias, back pain, joint swelling and myalgias.  Skin:  Negative for rash.  Neurological:  Negative for dizziness, weakness and headaches.  Psychiatric/Behavioral:  Negative for dysphoric mood. The patient is not nervous/anxious.     Objective:  BP 110/62 (BP Location: Left Arm, Patient Position: Sitting)   Pulse 73   Temp 98 F (36.7 C) (Temporal)   Ht 5\' 3"  (1.6 m)   Wt 235 lb (106.6 kg)   SpO2 99%   BMI 41.63 kg/m   BP/Weight 06/06/2021 11/19/3297 10/21/2681  Systolic BP 419 622 297  Diastolic BP 62 70 68  Wt. (Lbs) 235 240.2 241  BMI 41.63 42.55 42.69    Physical Exam Constitutional:      Appearance: Normal appearance.  HENT:     Right Ear: Tympanic membrane, ear canal and external ear normal.     Left Ear: Tympanic membrane, ear canal and external ear normal.     Nose: Nose normal.     Mouth/Throat:     Mouth: Mucous membranes are moist.  Cardiovascular:     Rate and Rhythm: Normal rate and regular rhythm.     Pulses: Normal pulses.     Heart sounds: Normal heart sounds.  Pulmonary:     Effort: Pulmonary effort is normal.     Breath sounds: Normal breath sounds.  Abdominal:     Palpations: Abdomen is soft.  Musculoskeletal:        General: Normal range of motion.     Cervical back: Normal range of motion.  Skin:    General: Skin is warm and dry.  Neurological:     Mental Status: She is alert and oriented to person, place, and time.  Psychiatric:        Mood and Affect: Mood normal.        Behavior: Behavior normal.        Thought Content: Thought content normal.        Judgment: Judgment  normal.    Diabetic Foot Exam - Simple   No data filed      Lab Results  Component Value Date   WBC 9.8 05/18/2021   HGB 15.3 05/18/2021   HCT  46.3 05/18/2021   PLT 251 05/18/2021   GLUCOSE 97 05/18/2021   CHOL 134 05/18/2021   TRIG 80 05/18/2021   HDL 46 05/18/2021   LDLCALC 72 05/18/2021   ALT 20 05/18/2021   AST 12 05/18/2021   NA 141 05/18/2021   K 4.8 05/18/2021   CL 102 05/18/2021   CREATININE 0.68 05/18/2021   BUN 17 05/18/2021   CO2 23 05/18/2021   HGBA1C 6.9 (H) 05/18/2021   MICROALBUR 30 05/18/2021      Assessment & Plan:   Problem List Items Addressed This Visit   None .  No orders of the defined types were placed in this encounter.   No orders of the defined types were placed in this encounter.    Follow-up: Return in about 2 days (around 06/08/2021) for medicare annual wellness.  An After Visit Summary was printed and given to the patient.       I,Xayla Puzio M Ashlyn Cabler,acting as a Education administrator for [employer], NP.,have documented all relevant documentation on the behalf of Rip Harbour, NP,as directed by  Rip Harbour, NP while in the presence of Rip Harbour, NP.    Rip Harbour, NP [place] [phone]

## 2021-06-06 NOTE — Progress Notes (Signed)
Acute Office Visit  Subjective:    Patient ID: Brandi Velazquez, female    DOB: [birth year], 60 y.o.   MRN: [id number]  Chief Complaint  Patient presents with   Post-menopausal bleeding    HPI Brandi Velazquez is a 60 year old Caucasian female that presented originally for Medicare Annual Wellness exam. However, stated to staff nurse that she is experiencing post-menopausal bleeding and dyspareunia. Stated she will return for [place] visit. Rebel states she has been experiencing vaginal dryness and pain during intercourse. States she has post-menopausal bleeding after intercourse. Onset was several months ago. She cannot recall last pap smear. States she went through menopause around 60 years-old.   Past Medical History:  Diagnosis Date   Ankle pain    Chronic pain    back   COPD (chronic obstructive pulmonary disease) (HCC)    Depression    Diabetes mellitus    GERD (gastroesophageal reflux disease)    Hyperlipidemia    Hypertension    Low back pain    Obstructive sleep apnea    Pneumonia 2010   Post-menopausal atrophic vaginitis    Superficial phlebitis     Past Surgical History:  Procedure Laterality Date   BACK SURGERY  1990   CESAREAN SECTION     CYST REMOVAL HAND     inner thigh   SPINE SURGERY     lumbar laminectomy and diskectomy    Family History  Problem Relation Age of Onset   Diabetes Mother    Heart disease Mother    Cancer Father    Stroke Sister     Social History   Socioeconomic History   Marital status: Single    Spouse name: Not on file   Number of children: Not on file   Years of education: Not on file   Highest education level: Not on file  Occupational History   Not on file  Tobacco Use   Smoking status: Former    Types: Cigarettes    Quit date: 03/20/2009    Years since quitting: 12.2   Smokeless tobacco: Never  Vaping Use   Vaping Use: Never used  Substance and Sexual Activity   Alcohol use: No   Drug use: No   Sexual activity: Not on file   Other Topics Concern   Not on file  Social History Narrative   Not on file   Social Determinants of Health   Financial Resource Strain: Not on file  Food Insecurity: Unknown   Worried About Running Out of Food in the Last Year: Not on file   [employer] of Food in the Last Year: Never true  Transportation Needs: No Transportation Needs   Lack of Transportation (Medical): No   Lack of Transportation (Non-Medical): No  Physical Activity: Not on file  Stress: Not on file  Social Connections: Not on file  Intimate Partner Violence: Not on file    Outpatient Medications Prior to Visit  Medication Sig Dispense Refill   albuterol (VENTOLIN HFA) 108 (90 Base) MCG/ACT inhaler Inhale 2 puffs into the lungs every 6 (six) hours as needed for wheezing or shortness of breath. 8 g 2   dapagliflozin propanediol (FARXIGA) 10 MG TABS tablet Take 1 tablet (10 mg total) by mouth daily before breakfast. 90 tablet 1   glucose blood (RELION GLUCOSE TEST STRIPS) test strip Use as instructed 100 each 2   ibuprofen (ADVIL) 800 MG tablet Take 1 tablet (800 mg total) by mouth every 8 (eight) hours as needed. [place]  tablet 0   insulin degludec (TRESIBA FLEXTOUCH) 200 UNIT/ML FlexTouch Pen Inject 30 Units into the skin daily. (Patient taking differently: Inject 20 Units into the skin daily.) 15 mL 0   metFORMIN (GLUCOPHAGE) 1000 MG tablet TAKE ONE TABLET BY MOUTH TWICE DAILY WITH A MEAL 180 tablet 1   Multiple Vitamin (MULTIVITAMIN) tablet Take 1 tablet by mouth daily.     pioglitazone (ACTOS) 30 MG tablet Take 1 tablet (30 mg total) by mouth daily. 90 tablet 1   pravastatin (PRAVACHOL) 40 MG tablet TAKE ONE TABLET BY MOUTH EVERYDAY AT BEDTIME 90 tablet 1   ramipril (ALTACE) 2.5 MG capsule Take 1 capsule (2.5 mg total) by mouth daily. 90 capsule 1   Semaglutide (OZEMPIC, 2 MG/DOSE, [place]) Inject 2 mg into the skin once a week. (Patient not taking: Reported on 06/01/2021)     spironolactone (ALDACTONE) 25 MG tablet TAKE ONE  TABLET BY MOUTH BEFORE BREAKFAST 90 tablet 1   traMADol (ULTRAM) 50 MG tablet Take 1 - 2 tablets every 6 hours as needed for pain     Facility-Administered Medications Prior to Visit  Medication Dose Route Frequency Provider Last Rate Last Admin   ipratropium-albuterol (DUONEB) 0.5-2.5 (3) MG/3ML nebulizer solution 3 mL  3 mL Nebulization Once Rip Harbour, NP        Allergies  Allergen Reactions   Bactrim [Sulfamethoxazole-Trimethoprim] Swelling   Morphine And Related     Review of Systems  Genitourinary:  Positive for dyspareunia, vaginal bleeding (post-menopausal after intercourse) and vaginal pain (dryness). Negative for vaginal discharge.  All other systems reviewed and are negative.     Objective:    Physical Exam Vitals reviewed.  Constitutional:      Appearance: She is obese.  HENT:     Head: Normocephalic.  Cardiovascular:     Rate and Rhythm: Normal rate and regular rhythm.     Pulses: Normal pulses.     Heart sounds: Normal heart sounds.  Pulmonary:     Effort: Pulmonary effort is normal.     Breath sounds: Normal breath sounds.  Abdominal:     General: Bowel sounds are normal.     Palpations: Abdomen is soft.  Skin:    General: Skin is warm and dry.     Capillary Refill: Capillary refill takes less than 2 seconds.  Neurological:     General: No focal deficit present.     Mental Status: She is alert and oriented to person, place, and time.  Psychiatric:        Mood and Affect: Mood normal.        Behavior: Behavior normal.    BP 110/62 (BP Location: Left Arm, Patient Position: Sitting)   Pulse 73   Temp 98 F (36.7 C) (Temporal)   Ht 5' 3" (1.6 m)   Wt 235 lb (106.6 kg)   SpO2 99%   BMI 41.63 kg/m  Wt Readings from Last 3 Encounters:  06/06/21 235 lb (106.6 kg)  05/18/21 240 lb 3.2 oz (109 kg)  04/20/21 241 lb (109.3 kg)    Health Maintenance Due  Topic Date Due   DEXA SCAN  Never done   HIV Screening  Never done   Hepatitis C  Screening  Never done   Zoster Vaccines- Shingrix (1 of 2) Never done   PAP SMEAR-Modifier  Never done   COVID-19 Vaccine (4 - Booster for Moderna series) 05/04/2021     Lab Results  Component Value Date   WBC  9.8 05/18/2021   HGB 15.3 05/18/2021   HCT 46.3 05/18/2021   MCV 87 05/18/2021   PLT 251 05/18/2021   Lab Results  Component Value Date   NA 141 05/18/2021   K 4.8 05/18/2021   CO2 23 05/18/2021   GLUCOSE 97 05/18/2021   BUN 17 05/18/2021   CREATININE 0.68 05/18/2021   BILITOT 0.4 05/18/2021   ALKPHOS 56 05/18/2021   AST 12 05/18/2021   ALT 20 05/18/2021   PROT 7.0 05/18/2021   ALBUMIN 4.6 05/18/2021   CALCIUM 9.6 05/18/2021   EGFR 99 05/18/2021   Lab Results  Component Value Date   CHOL 134 05/18/2021   Lab Results  Component Value Date   HDL 46 05/18/2021   Lab Results  Component Value Date   LDLCALC 72 05/18/2021   Lab Results  Component Value Date   TRIG 80 05/18/2021   Lab Results  Component Value Date   CHOLHDL 2.9 05/18/2021   Lab Results  Component Value Date   HGBA1C 6.9 (H) 05/18/2021       Assessment & Plan:   1. Postmenopause atrophic vaginitis - estradiol (ESTRACE VAGINAL) 0.1 MG/GM vaginal cream; Apply pea-size amount to vaginal nightly for two weeks, then twice weekly  Dispense: 42.5 g; Refill: 12  2. Post-menopausal bleeding -Vaginal moisturizer 2-3 days a week  3. Dyspareunia, female -Vaginal lubricant as needed with intercourse  4. Encounter for Papanicolaou smear for cervical cancer screening - IGP, Aptima HPV, rfx 16/18,45    Recommend vaginal moisturizer 2-3 times weekly and vaginal lubricant with intercourse Begin Estrace vaginal cream, apply pea size amount nightly to vaginal area for 2 weeks, then twice weekly Return Thursday, Sept 22nd, 2022 a for Medicare Annual Wellness exam  Follow-up: 2 days for MAW visit   I, Rip Harbour, NP, have reviewed all documentation for this visit. The documentation on  06/06/21 for the exam, diagnosis, procedures, and orders are all accurate and complete.    Signed, Rip Harbour, NP 06/06/21 at 9:45 PM

## 2021-06-07 ENCOUNTER — Telehealth: Payer: Self-pay

## 2021-06-07 NOTE — Progress Notes (Signed)
    Chronic Care Management Pharmacy Assistant   Name: Brandi Velazquez  MRN: [id number] DOB: 1960/11/15   Reason for Encounter: Patient Assistance for Farxiga  Medications: Outpatient Encounter Medications as of 06/07/2021  Medication Sig Note   albuterol (VENTOLIN HFA) 108 (90 Base) MCG/ACT inhaler Inhale 2 puffs into the lungs every 6 (six) hours as needed for wheezing or shortness of breath.    dapagliflozin propanediol (FARXIGA) 10 MG TABS tablet Take 1 tablet (10 mg total) by mouth daily before breakfast.    estradiol (ESTRACE VAGINAL) 0.1 MG/GM vaginal cream Apply pea-size amount to vaginal nightly for two weeks, then twice weekly    glucose blood (RELION GLUCOSE TEST STRIPS) test strip Use as instructed    ibuprofen (ADVIL) 800 MG tablet Take 1 tablet (800 mg total) by mouth every 8 (eight) hours as needed.    insulin degludec (TRESIBA FLEXTOUCH) 200 UNIT/ML FlexTouch Pen Inject 30 Units into the skin daily. (Patient taking differently: Inject 20 Units into the skin daily.) 05/23/2021: Per Dr. Alyse Low recommendation patient is to decrease to 20 units daily when beginning Ozempic 2 mg weekly   metFORMIN (GLUCOPHAGE) 1000 MG tablet TAKE ONE TABLET BY MOUTH TWICE DAILY WITH A MEAL    Multiple Vitamin (MULTIVITAMIN) tablet Take 1 tablet by mouth daily.    pioglitazone (ACTOS) 30 MG tablet Take 1 tablet (30 mg total) by mouth daily.    pravastatin (PRAVACHOL) 40 MG tablet TAKE ONE TABLET BY MOUTH EVERYDAY AT BEDTIME    ramipril (ALTACE) 2.5 MG capsule Take 1 capsule (2.5 mg total) by mouth daily.    Semaglutide (OZEMPIC, 2 MG/DOSE, [name]) Inject 2 mg into the skin once a week. (Patient not taking: Reported on 06/01/2021)    spironolactone (ALDACTONE) 25 MG tablet TAKE ONE TABLET BY MOUTH BEFORE BREAKFAST    traMADol (ULTRAM) 50 MG tablet Take 1 - 2 tablets every 6 hours as needed for pain    Facility-Administered Encounter Medications as of 06/07/2021  Medication   ipratropium-albuterol (DUONEB)  0.5-2.5 (3) MG/3ML nebulizer solution 3 mL   I just spoke with pt and she stated she needs pt assistance for Farxiga due to cost and could not afford to pay for it. She was under the impression that she was already on it for this medication which I do not see documentation of this. I reached out and send a message to Dr. Tobie Poet office to start this process since they have been doing her pt assistance in the office. This needs to be added to her packs next delivery after approval.  Anthony

## 2021-06-08 ENCOUNTER — Ambulatory Visit (INDEPENDENT_AMBULATORY_CARE_PROVIDER_SITE_OTHER): Payer: Medicare HMO | Admitting: Nurse Practitioner

## 2021-06-08 DIAGNOSIS — E782 Mixed hyperlipidemia: Secondary | ICD-10-CM

## 2021-06-08 NOTE — Progress Notes (Deleted)
Subjective:  Patient ID: Brandi Velazquez, female    DOB: 07/17/1961  Age: 60 y.o. MRN: [id number]  No chief complaint on file.   HPI Encounter for general adult medical examination without abnormal findings  Physical ("At Risk" items are starred): Patient's last physical exam was 1 year ago .  Smoking: Life-long non-smoker ;  Physical Activity: Exercises at least 3 times per week ;  Alcohol/Drug Use: Is a non-drinker ; No illicit drug use ;  Patient is not afflicted from Stress Incontinence and Urge Incontinence  Safety: reviewed ; Patient wears a seat belt, has smoke detectors, has carbon monoxide detectors, practices appropriate gun safety, and wears sunscreen with extended sun exposure. Dental Care: biannual cleanings, brushes and flosses daily. Ophthalmology/Optometry: Annual visit.  Hearing loss: none Vision impairments: none  {dexa:315725}.  @MAMMOFINDINGS @   Fall Risk  10/27/2020  Falls in the past year? 0  Number falls in past yr: 0  Injury with Fall? 0  Risk for fall due to : No Fall Risks  Follow up Falls evaluation completed     Depression screen [facility] 2/9 07/18/2020 07/13/2016  Decreased Interest 0 0  Down, Depressed, Hopeless 0 0  PHQ - 2 Score 0 0       Functional Status Survey:     Social Hx   Social History   Socioeconomic History   Marital status: Single    Spouse name: Not on file   Number of children: Not on file   Years of education: Not on file   Highest education level: Not on file  Occupational History   Not on file  Tobacco Use   Smoking status: Former    Types: Cigarettes    Quit date: 03/20/2009    Years since quitting: 12.2   Smokeless tobacco: Never  Vaping Use   Vaping Use: Never used  Substance and Sexual Activity   Alcohol use: No   Drug use: No   Sexual activity: Not on file  Other Topics Concern   Not on file  Social History Narrative   Not on file   Social Determinants of Health   Financial Resource Strain: Not on  file  Food Insecurity: Unknown   Worried About Running Out of Food in the Last Year: Not on file   [employer] of Food in the Last Year: Never true  Transportation Needs: No Transportation Needs   Lack of Transportation (Medical): No   Lack of Transportation (Non-Medical): No  Physical Activity: Not on file  Stress: Not on file  Social Connections: Not on file   Past Medical History:  Diagnosis Date   Ankle pain    Chronic pain    back   COPD (chronic obstructive pulmonary disease) (HCC)    Depression    Diabetes mellitus    GERD (gastroesophageal reflux disease)    Hyperlipidemia    Hypertension    Low back pain    Obstructive sleep apnea    Pneumonia 2010   Post-menopausal atrophic vaginitis    Superficial phlebitis    Family History  Problem Relation Age of Onset   Diabetes Mother    Heart disease Mother    Cancer Father    Stroke Sister     Review of Systems  Constitutional:  Negative for appetite change, fatigue and fever.  HENT:  Negative for congestion, ear pain, sinus pressure and sore throat.   Eyes:  Negative for pain.  Respiratory:  Negative for cough, chest tightness, shortness of  breath and wheezing.   Cardiovascular:  Negative for chest pain and palpitations.  Gastrointestinal:  Negative for abdominal pain, constipation, diarrhea, nausea and vomiting.  Genitourinary:  Negative for dysuria and hematuria.  Musculoskeletal:  Negative for arthralgias, back pain, joint swelling and myalgias.  Skin:  Negative for rash.  Neurological:  Negative for dizziness, weakness and headaches.  Psychiatric/Behavioral:  Negative for dysphoric mood. The patient is not nervous/anxious.     Objective:  There were no vitals taken for this visit.  BP/Weight 06/06/2021 12/21/2701 5/0/0938  Systolic BP 182 993 716  Diastolic BP 62 70 68  Wt. (Lbs) 235 240.2 241  BMI 41.63 42.55 42.69    Physical Exam Vitals reviewed.  Constitutional:      Appearance: Normal appearance.   HENT:     Right Ear: Tympanic membrane, ear canal and external ear normal.     Left Ear: Tympanic membrane, ear canal and external ear normal.     Nose: Nose normal.     Mouth/Throat:     Mouth: Mucous membranes are moist.  Cardiovascular:     Rate and Rhythm: Normal rate and regular rhythm.     Pulses: Normal pulses.     Heart sounds: Normal heart sounds.  Pulmonary:     Effort: Pulmonary effort is normal.     Breath sounds: Normal breath sounds.  Abdominal:     Palpations: Abdomen is soft.  Musculoskeletal:        General: Normal range of motion.     Cervical back: Normal range of motion.  Skin:    General: Skin is warm and dry.  Neurological:     Mental Status: She is alert and oriented to person, place, and time.  Psychiatric:        Mood and Affect: Mood normal.        Behavior: Behavior normal.        Thought Content: Thought content normal.        Judgment: Judgment normal.    Lab Results  Component Value Date   WBC 9.8 05/18/2021   HGB 15.3 05/18/2021   HCT 46.3 05/18/2021   PLT 251 05/18/2021   GLUCOSE 97 05/18/2021   CHOL 134 05/18/2021   TRIG 80 05/18/2021   HDL 46 05/18/2021   LDLCALC 72 05/18/2021   ALT 20 05/18/2021   AST 12 05/18/2021   NA 141 05/18/2021   K 4.8 05/18/2021   CL 102 05/18/2021   CREATININE 0.68 05/18/2021   BUN 17 05/18/2021   CO2 23 05/18/2021   HGBA1C 6.9 (H) 05/18/2021   MICROALBUR 30 05/18/2021      Assessment & Plan:  There are no diagnoses linked to this encounter.   No orders of the defined types were placed in this encounter.   These are the goals we discussed:  Goals      Learn More About My Health     Timeframe:  Long-Range Goal Priority:  High Start Date:                             Expected End Date:                        Follow Up Date 12/27/2020   - tell my story and reason for my visit - repeat what I heard to make sure I understand - bring a list of my medicines to the visit -  speak up when I  don't understand    Why is this important?   The best way to learn about your health and care is by talking to the doctor and nurse.  They will answer your questions and give you information in the way that you like best.    Notes:      Monitor and Manage My Blood Sugar-Diabetes Type 2     Timeframe:  Long-Range Goal Priority:  High Start Date:                             Expected End Date:                       Follow Up Date 12/27/2020    - check blood sugar at prescribed times - take the blood sugar log to all doctor visits    Why is this important?   Checking your blood sugar at home helps to keep it from getting very high or very low.  Writing the results in a diary or log helps the doctor know how to care for you.  Your blood sugar log should have the time, date and the results.  Also, write down the amount of insulin or other medicine that you take.  Other information, like what you ate, exercise done and how you were feeling, will also be helpful.     Notes:      Set My Target A1C-Diabetes Type 2     Timeframe:  Long-Range Goal Priority:  High Start Date:                             Expected End Date:                       Follow Up Date 12/27/2020    - set target A1C    Why is this important?   Your target A1C is decided together by you and your doctor.  It is based on several things like your age and other health issues.    Notes:          This is a list of the screening recommended for you and due dates:  Health Maintenance  Topic Date Due   DEXA scan (bone density measurement)  Never done   HIV Screening  Never done   Hepatitis C Screening: USPSTF Recommendation to screen - Ages 46-79 yo.  Never done   Zoster (Shingles) Vaccine (1 of 2) Never done   Pap Smear  Never done   COVID-19 Vaccine (4 - Booster for Moderna series) 05/04/2021   Hemoglobin A1C  11/15/2021   Complete foot exam   02/09/2022   Eye exam for diabetics  03/07/2022   Mammogram   10/27/2022   Colon Cancer Screening  01/02/2027   Tetanus Vaccine  05/15/2028   Flu Shot  Completed   HPV Vaccine  Aged Out     AN INDIVIDUALIZED CARE PLAN: was established or reinforced today.   SELF MANAGEMENT: The patient and I together assessed ways to personally work towards obtaining the recommended goals  Support needs The patient and/or family needs were assessed and services were offered and not necessary at this time.    Follow-up: No follow-ups on file.       I,Concetta Guion M Rozelle Caudle,acting as a Education administrator for [employer], NP.,have documented  all relevant documentation on the behalf of Rip Harbour, NP,as directed by  Rip Harbour, NP while in the presence of Rip Harbour, NP.    Rip Harbour, NP [place] [phone]

## 2021-06-10 LAB — IGP, APTIMA HPV, RFX 16/18,45
HPV Aptima: POSITIVE — AB
HPV Genotype 16: NEGATIVE
HPV Genotype 18,45: NEGATIVE
PAP Smear Comment: 0

## 2021-06-13 ENCOUNTER — Encounter: Payer: Self-pay | Admitting: Sports Medicine

## 2021-06-13 ENCOUNTER — Other Ambulatory Visit: Payer: Self-pay

## 2021-06-13 ENCOUNTER — Ambulatory Visit: Payer: Medicare HMO | Admitting: Sports Medicine

## 2021-06-13 ENCOUNTER — Ambulatory Visit (INDEPENDENT_AMBULATORY_CARE_PROVIDER_SITE_OTHER): Payer: Medicare HMO

## 2021-06-13 DIAGNOSIS — M7731 Calcaneal spur, right foot: Secondary | ICD-10-CM | POA: Diagnosis not present

## 2021-06-13 DIAGNOSIS — M7661 Achilles tendinitis, right leg: Secondary | ICD-10-CM

## 2021-06-13 DIAGNOSIS — M7660 Achilles tendinitis, unspecified leg: Secondary | ICD-10-CM

## 2021-06-13 DIAGNOSIS — E0800 Diabetes mellitus due to underlying condition with hyperosmolarity without nonketotic hyperglycemic-hyperosmolar coma (NKHHC): Secondary | ICD-10-CM

## 2021-06-13 MED ORDER — MELOXICAM 15 MG PO TABS: 15.0000 mg | ORAL_TABLET | Freq: Every day | ORAL | 0 refills | Status: DC

## 2021-06-13 NOTE — Progress Notes (Signed)
Subjective: Brandi Velazquez is a 60 y.o. female patient who presents to office for evaluation of Right heel pain. Patient complains of progressive pain especially over the last 2 to 3 months at the back of the heel states that there is a burning pain with swelling and a hard knot that is in the area.  Patient reports that pain is worse with direct pressure has been wearing open sandals but slowly little by little bit more activity the more painful it is patient denies any injury or trauma and reports that she has not tried any treatment besides the open back shoes.  States that she saw her PCP who referred her here because they did not want to do anything to touch the area. Patient denies any other pedal complaints.   Patient Active Problem List   Diagnosis Date Noted   Diabetic polyneuropathy associated with type 2 diabetes mellitus ([place]) 05/23/2021   Achilles tendon disorder, right 05/23/2021   Morbid obesity ([place]) 10/30/2020   Diabetes mellitus ([place]) 04/17/2020   Hyperlipidemia    Essential hypertension    GERD (gastroesophageal reflux disease)    Chronic midline low back pain without sciatica 01/10/2020   Back pain of lumbar region with sciatica 01/07/2020   Other acute recurrent sinusitis 10/29/2019   Cough 10/29/2019   Varicose veins of lower extremities with other complications 16/06/9603   Varicose veins of lower extremities with inflammation 06/14/2011    Current Outpatient Medications on File Prior to Visit  Medication Sig Dispense Refill   albuterol (VENTOLIN HFA) 108 (90 Base) MCG/ACT inhaler Inhale 2 puffs into the lungs every 6 (six) hours as needed for wheezing or shortness of breath. 8 g 2   dapagliflozin propanediol (FARXIGA) 10 MG TABS tablet Take 1 tablet (10 mg total) by mouth daily before breakfast. 90 tablet 1   estradiol (ESTRACE VAGINAL) 0.1 MG/GM vaginal cream Apply pea-size amount to vaginal nightly for two weeks, then twice weekly 42.5 g 12   glucose blood (RELION  GLUCOSE TEST STRIPS) test strip Use as instructed 100 each 2   ibuprofen (ADVIL) 800 MG tablet Take 1 tablet (800 mg total) by mouth every 8 (eight) hours as needed. 30 tablet 0   insulin degludec (TRESIBA FLEXTOUCH) 200 UNIT/ML FlexTouch Pen Inject 30 Units into the skin daily. (Patient taking differently: Inject 20 Units into the skin daily.) 15 mL 0   metFORMIN (GLUCOPHAGE) 1000 MG tablet TAKE ONE TABLET BY MOUTH TWICE DAILY WITH A MEAL 180 tablet 1   Multiple Vitamin (MULTIVITAMIN) tablet Take 1 tablet by mouth daily.     pioglitazone (ACTOS) 30 MG tablet Take 1 tablet (30 mg total) by mouth daily. 90 tablet 1   pravastatin (PRAVACHOL) 40 MG tablet TAKE ONE TABLET BY MOUTH EVERYDAY AT BEDTIME 90 tablet 1   ramipril (ALTACE) 2.5 MG capsule Take 1 capsule (2.5 mg total) by mouth daily. 90 capsule 1   Semaglutide (OZEMPIC, 2 MG/DOSE, [place]) Inject 2 mg into the skin once a week. (Patient not taking: Reported on 06/01/2021)     spironolactone (ALDACTONE) 25 MG tablet TAKE ONE TABLET BY MOUTH BEFORE BREAKFAST 90 tablet 1   traMADol (ULTRAM) 50 MG tablet Take 1 - 2 tablets every 6 hours as needed for pain     Current Facility-Administered Medications on File Prior to Visit  Medication Dose Route Frequency Provider Last Rate Last Admin   ipratropium-albuterol (DUONEB) 0.5-2.5 (3) MG/3ML nebulizer solution 3 mL  3 mL Nebulization Once Rip Harbour,  NP        Allergies  Allergen Reactions   Bactrim [Sulfamethoxazole-Trimethoprim] Swelling   Morphine And Related     Objective:  General: Alert and oriented x3 in no acute distress  Dermatology: No open lesions bilateral lower extremities, no webspace macerations, no ecchymosis bilateral, all nails x 10 are well manicured.  Previous nail hemorrhage is slowly growing out on the right hallux.  Vascular: Dorsalis Pedis and Posterior Tibial pedal pulses 1/4, Capillary Fill Time 3 seconds, + pedal hair growth bilateral, no edema bilateral lower  extremities, Temperature gradient within normal limits.  Neurology: Johney Maine sensation intact via light touch bilateral.   Musculoskeletal: Mild tenderness with palpation at insertion of the Achilles on Right, there is calcaneal exostosis with mild soft tissue present and decreased ankle rom with knee extending  vs flexed resembling gastroc equnius bilateral, The achilles tendon feels intact with no nodularity or palpable dell, Thompson sign negative, Subtalar and midtarsal joint range of motion is within normal limits, there is no 1st ray hypermobility or forefoot deformity noted bilateral.   Gait: Antalgic gait  Xrays  Right foot   Impression: Normal osseous mineralization. Joint spaces preserved. No fracture/dislocation/boney destruction. Calcaneal spur present posterior and inferior. Kager's triangle intact with no obliteration. No soft tissue abnormalities or radiopaque foreign bodies.   Assessment and Plan: Problem List Items Addressed This Visit       Endocrine   Diabetes mellitus ([place])   Other Visit Diagnoses     Achilles tendon pain    -  Primary   Relevant Orders   DG Foot Complete Right   Tendonitis, Achilles, right       Heel spur, right           -Complete examination performed -Xrays reviewed -Discussed treatement options -Dispensed heel lifts for patient to use as directed in shoes -Rx Meloxicam to take as directed -Recommend daily icing  -Recommend use of night splint and gentle stretching -Gave achilles sleeve to use as directed on right  -No improvement will consider CAM boot/MRI/PT/EPAT -Patient to return to office in 3-4 weeks or sooner if condition worsens.  Landis Martins, DPM

## 2021-06-16 DIAGNOSIS — E782 Mixed hyperlipidemia: Secondary | ICD-10-CM

## 2021-06-16 DIAGNOSIS — E1169 Type 2 diabetes mellitus with other specified complication: Secondary | ICD-10-CM

## 2021-06-16 DIAGNOSIS — Z794 Long term (current) use of insulin: Secondary | ICD-10-CM | POA: Diagnosis not present

## 2021-06-16 DIAGNOSIS — I10 Essential (primary) hypertension: Secondary | ICD-10-CM

## 2021-06-16 DIAGNOSIS — E11628 Type 2 diabetes mellitus with other skin complications: Secondary | ICD-10-CM

## 2021-06-20 ENCOUNTER — Ambulatory Visit: Payer: Medicare HMO

## 2021-06-21 ENCOUNTER — Telehealth: Payer: Self-pay

## 2021-06-21 NOTE — Progress Notes (Signed)
    Chronic Care Management Pharmacy Assistant   Name: Brandi Velazquez  MRN: [id number] DOB: 01/01/1961   Reason for Encounter: Patient Assistance documentation: Ozempic/[employer]), Farxiga([employer])   Pt needed to make sure they are going to send her Ozempic. Last time she talked to them they stated they are on back order. She stated she has plenty of her [name] and she has a month left on her [place]. All these are approved through 12/22.    Here is the status: Last shipment was 04/27/21. New early enrollment 07/01/21 ([place]) [place]- I spoke with a representative and the last shipment pt received was for the 0.25 mg dose and they received the dose increase form in September for 2 mg but did not process it due to an overlook but did process it today and released today for fulfillment and takes 10-14 business days to be sent to the office. I spoke to pt and informed her of all this and she was appreciative and will wait for her shipment.     Medications: Outpatient Encounter Medications as of 06/21/2021  Medication Sig Note   albuterol (VENTOLIN HFA) 108 (90 Base) MCG/ACT inhaler Inhale 2 puffs into the lungs every 6 (six) hours as needed for wheezing or shortness of breath.    dapagliflozin propanediol (FARXIGA) 10 MG TABS tablet Take 1 tablet (10 mg total) by mouth daily before breakfast.    estradiol (ESTRACE VAGINAL) 0.1 MG/GM vaginal cream Apply pea-size amount to vaginal nightly for two weeks, then twice weekly    glucose blood (RELION GLUCOSE TEST STRIPS) test strip Use as instructed    ibuprofen (ADVIL) 800 MG tablet Take 1 tablet (800 mg total) by mouth every 8 (eight) hours as needed.    insulin degludec (TRESIBA FLEXTOUCH) 200 UNIT/ML FlexTouch Pen Inject 30 Units into the skin daily. (Patient taking differently: Inject 20 Units into the skin daily.) 05/23/2021: Per Dr. Alyse Low recommendation patient is to decrease to 20 units daily when beginning Ozempic 2 mg  weekly   meloxicam (MOBIC) 15 MG tablet Take 1 tablet (15 mg total) by mouth daily.    metFORMIN (GLUCOPHAGE) 1000 MG tablet TAKE ONE TABLET BY MOUTH TWICE DAILY WITH A MEAL    Multiple Vitamin (MULTIVITAMIN) tablet Take 1 tablet by mouth daily.    pioglitazone (ACTOS) 30 MG tablet Take 1 tablet (30 mg total) by mouth daily.    pravastatin (PRAVACHOL) 40 MG tablet TAKE ONE TABLET BY MOUTH EVERYDAY AT BEDTIME    ramipril (ALTACE) 2.5 MG capsule Take 1 capsule (2.5 mg total) by mouth daily.    Semaglutide (OZEMPIC, 2 MG/DOSE, [place]) Inject 2 mg into the skin once a week. (Patient not taking: Reported on 06/01/2021)    spironolactone (ALDACTONE) 25 MG tablet TAKE ONE TABLET BY MOUTH BEFORE BREAKFAST    traMADol (ULTRAM) 50 MG tablet Take 1 - 2 tablets every 6 hours as needed for pain    Facility-Administered Encounter Medications as of 06/21/2021  Medication   ipratropium-albuterol (DUONEB) 0.5-2.5 (3) MG/3ML nebulizer solution 3 mL     [name], [place] Pharmacist Assistant  [phone]

## 2021-06-26 ENCOUNTER — Telehealth: Payer: Self-pay

## 2021-06-26 NOTE — Chronic Care Management (AMB) (Signed)
Chronic Care Management Pharmacy Assistant   Name: Brandi Velazquez  MRN: [id number] DOB: 13-Sep-1961   Reason for Encounter: Medication Coordination for [place]   Recent office visits:  06/06/21 Jerrell Belfast NP. Seen for postmenopause symptoms. Started Estradiol 0.1 mg/gm. Use nightly for two weeks then twice weekly.  Recent consult visits:  06/13/21 (Podiatry) Cannon Kettle, Lady Saucier DPM. Seen for Achilles Tendon Pain. Started Meloxicam 15 mg daily. Follow up in 4 weeks  Hospital visits:  None since 06/01/21  Medications: Outpatient Encounter Medications as of 06/26/2021  Medication Sig Note   albuterol (VENTOLIN HFA) 108 (90 Base) MCG/ACT inhaler Inhale 2 puffs into the lungs every 6 (six) hours as needed for wheezing or shortness of breath.    dapagliflozin propanediol (FARXIGA) 10 MG TABS tablet Take 1 tablet (10 mg total) by mouth daily before breakfast.    estradiol (ESTRACE VAGINAL) 0.1 MG/GM vaginal cream Apply pea-size amount to vaginal nightly for two weeks, then twice weekly    glucose blood (RELION GLUCOSE TEST STRIPS) test strip Use as instructed    ibuprofen (ADVIL) 800 MG tablet Take 1 tablet (800 mg total) by mouth every 8 (eight) hours as needed.    insulin degludec (TRESIBA FLEXTOUCH) 200 UNIT/ML FlexTouch Pen Inject 30 Units into the skin daily. (Patient taking differently: Inject 20 Units into the skin daily.) 05/23/2021: Per Dr. Alyse Low recommendation patient is to decrease to 20 units daily when beginning Ozempic 2 mg weekly   meloxicam (MOBIC) 15 MG tablet Take 1 tablet (15 mg total) by mouth daily.    metFORMIN (GLUCOPHAGE) 1000 MG tablet TAKE ONE TABLET BY MOUTH TWICE DAILY WITH A MEAL    Multiple Vitamin (MULTIVITAMIN) tablet Take 1 tablet by mouth daily.    pioglitazone (ACTOS) 30 MG tablet Take 1 tablet (30 mg total) by mouth daily.    pravastatin (PRAVACHOL) 40 MG tablet TAKE ONE TABLET BY MOUTH EVERYDAY AT BEDTIME    ramipril (ALTACE) 2.5 MG capsule Take 1 capsule  (2.5 mg total) by mouth daily.    Semaglutide (OZEMPIC, 2 MG/DOSE, [place]) Inject 2 mg into the skin once a week. (Patient not taking: Reported on 06/01/2021)    spironolactone (ALDACTONE) 25 MG tablet TAKE ONE TABLET BY MOUTH BEFORE BREAKFAST    traMADol (ULTRAM) 50 MG tablet Take 1 - 2 tablets every 6 hours as needed for pain    Facility-Administered Encounter Medications as of 06/26/2021  Medication   ipratropium-albuterol (DUONEB) 0.5-2.5 (3) MG/3ML nebulizer solution 3 mL   Reviewed chart for medication changes ahead of medication coordination call.  No OVs, Consults, or hospital visits since last care coordination call/Pharmacist visit. (If appropriate, list visit date, provider name)  No medication changes indicated OR if recent visit, treatment plan here.  BP Readings from Last 3 Encounters:  06/06/21 110/62  05/18/21 124/70  04/20/21 101/68    Lab Results  Component Value Date   HGBA1C 6.9 (H) 05/18/2021     Patient obtains medications through Adherence Packaging  90 Days   Last adherence delivery included:  Pravastatin 40 mg- 1 tablet daily (bedtime) Glimepiride 4 mg- 2 tablets daily (before breakfast) Pioglitazone 30 mg- 1 tablet daily  (before breakfast) Spironolactone 25 mg- 1 tablet daily (before breakfast) Metformin 1000 mg- 1 tablet twice daily (breakfast, evening meal) Ramipril 2.5 mg-1 tablet daily (breakfast  Patient gets Patient Assistance for the following: Farxiga 10mg  Ozempic 2 mg Tresiba 200 units  Patient declined the following medications last month: None   Patient  is due for next adherence delivery on: 07/06/21.  Called patient and reviewed medications and coordinated delivery.  This delivery to include: Pravastatin 40 mg- 1 tablet daily (bedtime) Glimepiride 4 mg- 2 tablets daily (before breakfast) Pioglitazone 30 mg- 1 tablet daily  (before breakfast) Spironolactone 25 mg- 1 tablet daily (before breakfast) Metformin 1000 mg- 1 tablet twice  daily (breakfast, evening meal) Ramipril 2.5 mg-1 tablet daily (breakfast Meloxicam 15 mg 1 tablet at breakfast   Per Dr. Alyse Low notes she is supposed to hold the Glimepiride once she received the increase to her Ozempic. Pt has not received that yet so we will include the Glimepiride on this order and just hold once she gets her Ozempic. Pt understood.  Patient needs refills for: Meloxicam 15 mg Glimepiride 4 mg Pioglitazone 30 mg  Confirmed delivery date of 07/06/21, advised patient that pharmacy will contact them the morning of delivery.    [name], [place] Pharmacist Assistant  [phone]

## 2021-06-29 ENCOUNTER — Other Ambulatory Visit: Payer: Self-pay | Admitting: Family Medicine

## 2021-06-30 ENCOUNTER — Telehealth: Payer: Self-pay

## 2021-06-30 NOTE — Chronic Care Management (AMB) (Signed)
    Chronic Care Management Pharmacy Assistant   Name: Brandi Velazquez  MRN: [id number] DOB: 11/11/1960   Reason for Encounter: Check on Farxiga through [facility]  I spoke to pt today and she stated that she only has enough Wilder [place]ade to get her to November and not until the end of the year. I called [place] to check on this and I was on hold for 25 minutes. I could not get anyone to answer. I will check back next week.  Update: I called pt to inform her that I have been unsuccessful in reaching [facility] and was on hold for 30 minutes. I gave the pt the numbers to reach out to them. Pt understood.   Medications: Outpatient Encounter Medications as of 06/30/2021  Medication Sig Note   albuterol (VENTOLIN HFA) 108 (90 Base) MCG/ACT inhaler Inhale 2 puffs into the lungs every 6 (six) hours as needed for wheezing or shortness of breath.    dapagliflozin propanediol (FARXIGA) 10 MG TABS tablet Take 1 tablet (10 mg total) by mouth daily before breakfast.    estradiol (ESTRACE VAGINAL) 0.1 MG/GM vaginal cream Apply pea-size amount to vaginal nightly for two weeks, then twice weekly    glucose blood (RELION GLUCOSE TEST STRIPS) test strip Use as instructed    ibuprofen (ADVIL) 800 MG tablet Take 1 tablet (800 mg total) by mouth every 8 (eight) hours as needed.    insulin degludec (TRESIBA FLEXTOUCH) 200 UNIT/ML FlexTouch Pen Inject 30 Units into the skin daily. (Patient taking differently: Inject 20 Units into the skin daily.) 05/23/2021: Per Dr. Alyse Low recommendation patient is to decrease to 20 units daily when beginning Ozempic 2 mg weekly   meloxicam (MOBIC) 15 MG tablet Take 1 tablet (15 mg total) by mouth daily.    metFORMIN (GLUCOPHAGE) 1000 MG tablet TAKE ONE TABLET BY MOUTH TWICE DAILY WITH A MEAL    Multiple Vitamin (MULTIVITAMIN) tablet Take 1 tablet by mouth daily.    pioglitazone (ACTOS) 30 MG tablet TAKE ONE TABLET BY MOUTH BEFORE BREAKFAST    pravastatin (PRAVACHOL) 40 MG tablet TAKE ONE TABLET BY MOUTH  EVERYDAY AT BEDTIME    ramipril (ALTACE) 2.5 MG capsule Take 1 capsule (2.5 mg total) by mouth daily.    Semaglutide (OZEMPIC, 2 MG/DOSE, [place]) Inject 2 mg into the skin once a week. (Patient not taking: Reported on 06/01/2021)    spironolactone (ALDACTONE) 25 MG tablet TAKE ONE TABLET BY MOUTH BEFORE BREAKFAST    traMADol (ULTRAM) 50 MG tablet Take 1 - 2 tablets every 6 hours as needed for pain    Facility-Administered Encounter Medications as of 06/30/2021  Medication   ipratropium-albuterol (DUONEB) 0.5-2.5 (3) MG/3ML nebulizer solution 3 mL   [name], [facility] Pharmacist Assistant  [phone]

## 2021-07-03 ENCOUNTER — Other Ambulatory Visit: Payer: Self-pay | Admitting: Family Medicine

## 2021-07-03 MED ORDER — GLIMEPIRIDE 4 MG PO TABS: 4.0000 mg | ORAL_TABLET | Freq: Every day | ORAL | 1 refills | Status: DC

## 2021-07-04 ENCOUNTER — Telehealth: Payer: Self-pay

## 2021-07-04 NOTE — Telephone Encounter (Signed)
Patient called for Brandi Velazquez to report that her patient assistance expires 08/16/21.  Please call her back.

## 2021-07-05 ENCOUNTER — Telehealth: Payer: Self-pay

## 2021-07-05 NOTE — Telephone Encounter (Signed)
Coordinated with [facility] to verify Glimepiride was delivered on 07/04/21

## 2021-07-12 ENCOUNTER — Ambulatory Visit: Payer: Medicare HMO | Admitting: Sports Medicine

## 2021-07-14 ENCOUNTER — Telehealth: Payer: Self-pay

## 2021-07-14 NOTE — Telephone Encounter (Signed)
Patient informed that patient assistance for ozempic is available for pick up at our office.

## 2021-07-18 ENCOUNTER — Ambulatory Visit (INDEPENDENT_AMBULATORY_CARE_PROVIDER_SITE_OTHER): Payer: Medicare HMO | Admitting: Nurse Practitioner

## 2021-07-18 ENCOUNTER — Encounter: Payer: Self-pay | Admitting: Nurse Practitioner

## 2021-07-18 VITALS — BP 138/68 | Temp 97.0°F | Ht 63.0 in | Wt 233.0 lb

## 2021-07-18 DIAGNOSIS — Z8701 Personal history of pneumonia (recurrent): Secondary | ICD-10-CM | POA: Diagnosis not present

## 2021-07-18 DIAGNOSIS — R062 Wheezing: Secondary | ICD-10-CM | POA: Diagnosis not present

## 2021-07-18 DIAGNOSIS — J018 Other acute sinusitis: Secondary | ICD-10-CM

## 2021-07-18 DIAGNOSIS — R051 Acute cough: Secondary | ICD-10-CM | POA: Diagnosis not present

## 2021-07-18 LAB — POCT INFLUENZA A/B
Influenza A, POC: NEGATIVE
Influenza B, POC: NEGATIVE

## 2021-07-18 LAB — POCT RAPID STREP A (OFFICE): Rapid Strep A Screen: NEGATIVE

## 2021-07-18 MED ORDER — ALBUTEROL SULFATE HFA 108 (90 BASE) MCG/ACT IN AERS: 2.0000 | INHALATION_SPRAY | Freq: Four times a day (QID) | RESPIRATORY_TRACT | 2 refills | Status: DC | PRN

## 2021-07-18 MED ORDER — TRIAMCINOLONE ACETONIDE 40 MG/ML IJ SUSP: 60.0000 mg | Freq: Once | INTRAMUSCULAR | Status: DC

## 2021-07-18 MED ORDER — AZITHROMYCIN 250 MG PO TABS: ORAL_TABLET | ORAL | 0 refills | Status: AC

## 2021-07-18 MED ORDER — TRIAMCINOLONE ACETONIDE 40 MG/ML IJ SUSP
60.0000 mg | Freq: Once | INTRAMUSCULAR | Status: AC
  Administered 2021-07-18: 60 mg via INTRAMUSCULAR

## 2021-07-18 MED ORDER — BENZONATATE 100 MG PO CAPS
200.0000 mg | ORAL_CAPSULE | Freq: Three times a day (TID) | ORAL | 0 refills | Status: DC | PRN
Start: 2021-07-18 — End: 2021-08-22

## 2021-07-18 NOTE — Progress Notes (Signed)
Acute Office Visit  Subjective:    Patient ID: Brandi Velazquez, female    DOB: Sep 11, 1961, 60 y.o.   MRN: [id number]  Chief Complaint  Patient presents with   URI    HPI: Patient is in today for Upper respiratory symptoms She complains of congestion, cough described as productive, wheezing, chest tightness, and nasal congestion.with no fever, chills, night sweats or weight loss. Onset of symptoms was  3 weeks ago and worsening.Treatment has included  Mucinex and pushing fluids. Past history is significant for pneumonia. Patient is former smoker.Denies known COPD or asthma.  Past Medical History:  Diagnosis Date   Ankle pain    Chronic pain    back   COPD (chronic obstructive pulmonary disease) (HCC)    Depression    Diabetes mellitus    GERD (gastroesophageal reflux disease)    Hyperlipidemia    Hypertension    Low back pain    Obstructive sleep apnea    Pneumonia 2010   Post-menopausal atrophic vaginitis    Superficial phlebitis     Past Surgical History:  Procedure Laterality Date   BACK SURGERY  1990   CESAREAN SECTION     CYST REMOVAL HAND     inner thigh   SPINE SURGERY     lumbar laminectomy and diskectomy    Family History  Problem Relation Age of Onset   Diabetes Mother    Heart disease Mother    Cancer Father    Stroke Sister     Social History   Socioeconomic History   Marital status: Single    Spouse name: Not on file   Number of children: Not on file   Years of education: Not on file   Highest education level: Not on file  Occupational History   Not on file  Tobacco Use   Smoking status: Former    Types: Cigarettes    Quit date: 03/20/2009    Years since quitting: 12.3   Smokeless tobacco: Never  Vaping Use   Vaping Use: Never used  Substance and Sexual Activity   Alcohol use: No   Drug use: No   Sexual activity: Not on file  Other Topics Concern   Not on file  Social History Narrative   Not on file   Social Determinants of  Health   Financial Resource Strain: Not on file  Food Insecurity: Unknown   Worried About Running Out of Food in the Last Year: Not on file   [employer] of Food in the Last Year: Never true  Transportation Needs: No Transportation Needs   Lack of Transportation (Medical): No   Lack of Transportation (Non-Medical): No  Physical Activity: Not on file  Stress: Not on file  Social Connections: Not on file  Intimate Partner Violence: Not on file    Outpatient Medications Prior to Visit  Medication Sig Dispense Refill   albuterol (VENTOLIN HFA) 108 (90 Base) MCG/ACT inhaler Inhale 2 puffs into the lungs every 6 (six) hours as needed for wheezing or shortness of breath. 8 g 2   dapagliflozin propanediol (FARXIGA) 10 MG TABS tablet Take 1 tablet (10 mg total) by mouth daily before breakfast. 90 tablet 1   estradiol (ESTRACE VAGINAL) 0.1 MG/GM vaginal cream Apply pea-size amount to vaginal nightly for two weeks, then twice weekly 42.5 g 12   glimepiride (AMARYL) 4 MG tablet Take 1 tablet (4 mg total) by mouth daily before breakfast. 30 tablet 1   glucose blood (RELION GLUCOSE  TEST STRIPS) test strip Use as instructed 100 each 2   ibuprofen (ADVIL) 800 MG tablet Take 1 tablet (800 mg total) by mouth every 8 (eight) hours as needed. 30 tablet 0   insulin degludec (TRESIBA FLEXTOUCH) 200 UNIT/ML FlexTouch Pen Inject 30 Units into the skin daily. (Patient taking differently: Inject 20 Units into the skin daily.) 15 mL 0   meloxicam (MOBIC) 15 MG tablet Take 1 tablet (15 mg total) by mouth daily. 30 tablet 0   metFORMIN (GLUCOPHAGE) 1000 MG tablet TAKE ONE TABLET BY MOUTH TWICE DAILY WITH A MEAL 180 tablet 1   Multiple Vitamin (MULTIVITAMIN) tablet Take 1 tablet by mouth daily.     pioglitazone (ACTOS) 30 MG tablet TAKE ONE TABLET BY MOUTH BEFORE BREAKFAST 90 tablet 1   pravastatin (PRAVACHOL) 40 MG tablet TAKE ONE TABLET BY MOUTH EVERYDAY AT BEDTIME 90 tablet 1   ramipril (ALTACE) 2.5 MG capsule Take 1  capsule (2.5 mg total) by mouth daily. 90 capsule 1   Semaglutide (OZEMPIC, 2 MG/DOSE, [place]) Inject 2 mg into the skin once a week. (Patient not taking: Reported on 06/01/2021)     spironolactone (ALDACTONE) 25 MG tablet TAKE ONE TABLET BY MOUTH BEFORE BREAKFAST 90 tablet 1   traMADol (ULTRAM) 50 MG tablet Take 1 - 2 tablets every 6 hours as needed for pain     Facility-Administered Medications Prior to Visit  Medication Dose Route Frequency Provider Last Rate Last Admin   ipratropium-albuterol (DUONEB) 0.5-2.5 (3) MG/3ML nebulizer solution 3 mL  3 mL Nebulization Once Rip Harbour, NP        Allergies  Allergen Reactions   Bactrim [Sulfamethoxazole-Trimethoprim] Swelling   Morphine And Related     Review of Systems  Constitutional:  Negative for chills, fatigue and fever.  HENT:  Positive for congestion, postnasal drip and rhinorrhea. Negative for ear pain, sinus pressure, sinus pain and sore throat.   Eyes: Negative.   Respiratory:  Positive for cough. Negative for shortness of breath.   Cardiovascular:  Negative for chest pain.  Gastrointestinal:  Negative for diarrhea and nausea.  Genitourinary: Negative.   Musculoskeletal: Negative.   Skin: Negative.   Allergic/Immunologic: Positive for environmental allergies.  Neurological:  Positive for headaches. Negative for dizziness.  Hematological: Negative.   Psychiatric/Behavioral: Negative.        Objective:    Physical Exam Vitals reviewed.  Constitutional:      Appearance: Normal appearance.  HENT:     Right Ear: Tympanic membrane normal.     Left Ear: Tympanic membrane normal.     Nose: Congestion and rhinorrhea present.     Mouth/Throat:     Mouth: Mucous membranes are moist.     Pharynx: Posterior oropharyngeal erythema present.  Cardiovascular:     Rate and Rhythm: Normal rate and regular rhythm.     Pulses: Normal pulses.     Heart sounds: Normal heart sounds.  Pulmonary:     Effort: Pulmonary effort is  normal.     Breath sounds: Wheezing and rhonchi present.  Abdominal:     General: Bowel sounds are normal.     Palpations: Abdomen is soft.  Skin:    General: Skin is warm and dry.     Capillary Refill: Capillary refill takes less than 2 seconds.  Neurological:     General: No focal deficit present.     Mental Status: She is alert and oriented to person, place, and time.  Psychiatric:  Mood and Affect: Mood normal.        Behavior: Behavior normal.    Temp (!) 97 F (36.1 C)   Ht 5' 3"  (1.6 m)   Wt 233 lb (105.7 kg)   BMI 41.27 kg/m  Wt Readings from Last 3 Encounters:  07/18/21 233 lb (105.7 kg)  06/06/21 235 lb (106.6 kg)  05/18/21 240 lb 3.2 oz (109 kg)    Health Maintenance Due  Topic Date Due   DEXA SCAN  Never done   HIV Screening  Never done   Hepatitis C Screening  Never done   Zoster Vaccines- Shingrix (1 of 2) Never done   Pneumococcal Vaccine 31-71 Years old (2 - PCV) 10/08/2012   COVID-19 Vaccine (3 - Moderna risk series) 03/09/2021    Lab Results  Component Value Date   WBC 9.8 05/18/2021   HGB 15.3 05/18/2021   HCT 46.3 05/18/2021   MCV 87 05/18/2021   PLT 251 05/18/2021   Lab Results  Component Value Date   NA 141 05/18/2021   K 4.8 05/18/2021   CO2 23 05/18/2021   GLUCOSE 97 05/18/2021   BUN 17 05/18/2021   CREATININE 0.68 05/18/2021   BILITOT 0.4 05/18/2021   ALKPHOS 56 05/18/2021   AST 12 05/18/2021   ALT 20 05/18/2021   PROT 7.0 05/18/2021   ALBUMIN 4.6 05/18/2021   CALCIUM 9.6 05/18/2021   EGFR 99 05/18/2021   Lab Results  Component Value Date   CHOL 134 05/18/2021   Lab Results  Component Value Date   HDL 46 05/18/2021   Lab Results  Component Value Date   LDLCALC 72 05/18/2021   Lab Results  Component Value Date   TRIG 80 05/18/2021   Lab Results  Component Value Date   CHOLHDL 2.9 05/18/2021   Lab Results  Component Value Date   HGBA1C 6.9 (H) 05/18/2021       Assessment & Plan:   1. Acute  non-recurrent sinusitis of other sinus - azithromycin (ZITHROMAX) 250 MG tablet; Take 2 tablets on day 1, then 1 tablet daily on days 2 through 5  Dispense: 6 tablet; Refill: 0 - albuterol (VENTOLIN HFA) 108 (90 Base) MCG/ACT inhaler; Inhale 2 puffs into the lungs every 6 (six) hours as needed for wheezing or shortness of breath.  Dispense: 8 g; Refill: 2 - triamcinolone acetonide (KENALOG-40) injection 60 mg  2. Acute cough - POCT rapid strep A - POCT Influenza A/B - benzonatate (TESSALON) 100 MG capsule; Take 2 capsules (200 mg total) by mouth 3 (three) times daily as needed for cough.  Dispense: 30 capsule; Refill: 0 - DG Chest 2 View; Future  3. Wheezing - albuterol (VENTOLIN HFA) 108 (90 Base) MCG/ACT inhaler; Inhale 2 puffs into the lungs every 6 (six) hours as needed for wheezing or shortness of breath.  Dispense: 8 g; Refill: 2 - DG Chest 2 View; Future  4. History of pneumonia - DG Chest 2 View; Future    Take Z-pack as prescribed Take Tessalon perles up to three times daily for cough  Use Albuterol inhaler as needed for shortness of breath or wheezing every 4-6 hours Seek emergency medical care for severe shortness of breath Obtain outpatient chest x-ray if symptoms fail to improve in 48 hours Follow-up as needed  Orders Placed This Encounter  Procedures   POCT rapid strep A   POCT Influenza A/B     Follow-up: PRN  An After Visit Summary was printed and given to  the patient.  I, Rip Harbour, NP, have reviewed all documentation for this visit. The documentation on 07/18/21 for the exam, diagnosis, procedures, and orders are all accurate and complete.   Signed, Rip Harbour, NP [place] [phone]

## 2021-07-18 NOTE — Patient Instructions (Addendum)
Take Z-pack as prescribed Take Tessalon perles up to three times daily for cough  Use Albuterol inhaler as needed for shortness of breath or wheezing every 4-6 hours Seek emergency medical care for severe shortness of breath Obtain outpatient chest x-ray if symptoms fail to improve in 48 hours Follow-up as needed  Sinusitis, Adult Sinusitis is soreness and swelling (inflammation) of your sinuses. Sinuses are hollow spaces in the bones around your face. They are located: Around your eyes. In the middle of your forehead. Behind your nose. In your cheekbones. Your sinuses and nasal passages are lined with a fluid called mucus. Mucus drains out of your sinuses. Swelling can trap mucus in your sinuses. This lets germs (bacteria, virus, or fungus) grow, which leads to infection. Most of the time, this condition is caused by a virus. What are the causes? This condition is caused by: Allergies. Asthma. Germs. Things that block your nose or sinuses. Growths in the nose (nasal polyps). Chemicals or irritants in the air. Fungus (rare). What increases the risk? You are more likely to develop this condition if: You have a weak body defense system (immune system). You do a lot of swimming or diving. You use nasal sprays too much. You smoke. What are the signs or symptoms? The main symptoms of this condition are pain and a feeling of pressure around the sinuses. Other symptoms include: Stuffy nose (congestion). Runny nose (drainage). Swelling and warmth in the sinuses. Headache. Toothache. A cough that may get worse at night. Mucus that collects in the throat or the back of the nose (postnasal drip). Being unable to smell and taste. Being very tired (fatigue). A fever. Sore throat. Bad breath. How is this diagnosed? This condition is diagnosed based on: Your symptoms. Your medical history. A physical exam. Tests to find out if your condition is short-term (acute) or long-term  (chronic). Your doctor may: Check your nose for growths (polyps). Check your sinuses using a tool that has a light (endoscope). Check for allergies or germs. Do imaging tests, such as an MRI or CT scan. How is this treated? Treatment for this condition depends on the cause and whether it is short-term or long-term. If caused by a virus, your symptoms should go away on their own within 10 days. You may be given medicines to relieve symptoms. They include: Medicines that shrink swollen tissue in the nose. Medicines that treat allergies (antihistamines). A spray that treats swelling of the nostrils.  Rinses that help get rid of thick mucus in your nose (nasal saline washes). If caused by bacteria, your doctor may wait to see if you will get better without treatment. You may be given antibiotic medicine if you have: A very bad infection. A weak body defense system. If caused by growths in the nose, you may need to have surgery. Follow these instructions at home: Medicines Take, use, or apply over-the-counter and prescription medicines only as told by your doctor. These may include nasal sprays. If you were prescribed an antibiotic medicine, take it as told by your doctor. Do not stop taking the antibiotic even if you start to feel better. Hydrate and humidify  Drink enough water to keep your pee (urine) pale yellow. Use a cool mist humidifier to keep the humidity level in your home above 50%. Breathe in steam for 10-15 minutes, 3-4 times a day, or as told by your doctor. You can do this in the bathroom while a hot shower is running. Try not to spend time  in cool or dry air. Rest Rest as much as you can. Sleep with your head raised (elevated). Make sure you get enough sleep each night. General instructions  Put a warm, moist washcloth on your face 3-4 times a day, or as often as told by your doctor. This will help with discomfort. Wash your hands often with soap and water. If there is no  soap and water, use hand sanitizer. Do not smoke. Avoid being around people who are smoking (secondhand smoke). Keep all follow-up visits as told by your doctor. This is important. Contact a doctor if: You have a fever. Your symptoms get worse. Your symptoms do not get better within 10 days. Get help right away if: You have a very bad headache. You cannot stop throwing up (vomiting). You have very bad pain or swelling around your face or eyes. You have trouble seeing. You feel confused. Your neck is stiff. You have trouble breathing. Summary Sinusitis is swelling of your sinuses. Sinuses are hollow spaces in the bones around your face. This condition is caused by tissues in your nose that become inflamed or swollen. This traps germs. These can lead to infection. If you were prescribed an antibiotic medicine, take it as told by your doctor. Do not stop taking it even if you start to feel better. Keep all follow-up visits as told by your doctor. This is important. This information is not intended to replace advice given to you by your health care provider. Make sure you discuss any questions you have with your health care provider. Document Revised: 02/03/2018 Document Reviewed: 02/03/2018 Elsevier Patient Education  2022 [employer].

## 2021-07-25 ENCOUNTER — Telehealth: Payer: Self-pay

## 2021-07-25 NOTE — Chronic Care Management (AMB) (Signed)
Chronic Care Management Pharmacy Assistant   Name: SAHIAN KERNEY  MRN: [id number] DOB: 06-17-1961   Reason for Encounter: Medication Coordination for [facility]     Recent office visits:  07/18/21 Jerrell Belfast NP. Seen for sinusitis. Started on Azithromycin 250 mg for 5 days. Started on Benzonatate 200 mg 3 times daily prn.   Recent consult visits:  None   Hospital visits:  None   Medications: Outpatient Encounter Medications as of 07/25/2021  Medication Sig Note   albuterol (VENTOLIN HFA) 108 (90 Base) MCG/ACT inhaler Inhale 2 puffs into the lungs every 6 (six) hours as needed for wheezing or shortness of breath.    benzonatate (TESSALON) 100 MG capsule Take 2 capsules (200 mg total) by mouth 3 (three) times daily as needed for cough.    dapagliflozin propanediol (FARXIGA) 10 MG TABS tablet Take 1 tablet (10 mg total) by mouth daily before breakfast.    estradiol (ESTRACE VAGINAL) 0.1 MG/GM vaginal cream Apply pea-size amount to vaginal nightly for two weeks, then twice weekly    glimepiride (AMARYL) 4 MG tablet Take 1 tablet (4 mg total) by mouth daily before breakfast.    glucose blood (RELION GLUCOSE TEST STRIPS) test strip Use as instructed    ibuprofen (ADVIL) 800 MG tablet Take 1 tablet (800 mg total) by mouth every 8 (eight) hours as needed.    insulin degludec (TRESIBA FLEXTOUCH) 200 UNIT/ML FlexTouch Pen Inject 30 Units into the skin daily. (Patient taking differently: Inject 20 Units into the skin daily.) 05/23/2021: Per Dr. Alyse Low recommendation patient is to decrease to 20 units daily when beginning Ozempic 2 mg weekly   meloxicam (MOBIC) 15 MG tablet Take 1 tablet (15 mg total) by mouth daily.    metFORMIN (GLUCOPHAGE) 1000 MG tablet TAKE ONE TABLET BY MOUTH TWICE DAILY WITH A MEAL    Multiple Vitamin (MULTIVITAMIN) tablet Take 1 tablet by mouth daily.    pioglitazone (ACTOS) 30 MG tablet TAKE ONE TABLET BY MOUTH BEFORE BREAKFAST    pravastatin (PRAVACHOL) 40 MG tablet  TAKE ONE TABLET BY MOUTH EVERYDAY AT BEDTIME    ramipril (ALTACE) 2.5 MG capsule Take 1 capsule (2.5 mg total) by mouth daily.    Semaglutide (OZEMPIC, 2 MG/DOSE, [place]) Inject 2 mg into the skin once a week. (Patient not taking: Reported on 06/01/2021)    spironolactone (ALDACTONE) 25 MG tablet TAKE ONE TABLET BY MOUTH BEFORE BREAKFAST    traMADol (ULTRAM) 50 MG tablet Take 1 - 2 tablets every 6 hours as needed for pain    Facility-Administered Encounter Medications as of 07/25/2021  Medication   ipratropium-albuterol (DUONEB) 0.5-2.5 (3) MG/3ML nebulizer solution 3 mL    Reviewed chart for medication changes ahead of medication coordination call.  No OVs, Consults, or hospital visits since last care coordination call/Pharmacist visit. (If appropriate, list visit date, provider name)  No medication changes indicated OR if recent visit, treatment plan here.  BP Readings from Last 3 Encounters:  07/18/21 138/68  06/06/21 110/62  05/18/21 124/70    Lab Results  Component Value Date   HGBA1C 6.9 (H) 05/18/2021     Patient obtains medications through Vials with safety caps  Last adherence delivery included:  Pravastatin 40 mg- 1 tablet daily (bedtime) Glimepiride 4 mg- 2 tablets daily (before breakfast) Pioglitazone 30 mg- 1 tablet daily  (before breakfast) Spironolactone 25 mg- 1 tablet daily (before breakfast) Metformin 1000 mg- 1 tablet twice daily (breakfast, evening meal) Ramipril 2.5 mg-1 tablet daily (breakfast  Meloxicam 15 mg 1 tablet at breakfast   Patient gets Patient Assistance for the following: Farxiga 10mg  Ozempic 2 mg Tresiba 200 units   Patient is due for next adherence delivery on: 08/04/21 . Called patient and reviewed medications and coordinated delivery.  This delivery to include: Meloxicam 15 mg 1 tablet at breakfast   Patient declined the following medications  Glimepiride 4 mg pt was told to hold this medication once she gets her Ozempic 2 mg and pt  has started this so she does not need the Glimepiride.  Pravastatin 40 mg- 1 tablet daily (bedtime) Has enough for next delivery Glimepiride 4 mg- 2 tablets daily (before breakfast) Has enough for next delivery Pioglitazone 30 mg- 1 tablet daily  (before breakfast) Has enough for next delivery Spironolactone 25 mg- 1 tablet daily (before breakfast) Has enough for next delivery Metformin 1000 mg- 1 tablet twice daily (breakfast, evening meal) Has enough for next delivery Ramipril 2.5 mg-1 tablet daily (breakfast Has enough for next delivery  Patient needs refills for  Meloxicam   Confirmed delivery date of 08/04/21, advised patient that pharmacy will contact them the morning of delivery.  [name], [facility] Pharmacist Assistant  [phone]

## 2021-07-26 NOTE — Telephone Encounter (Signed)
Compliant on meds, f/u in Jan when due for next refill

## 2021-07-28 ENCOUNTER — Other Ambulatory Visit: Payer: Self-pay | Admitting: Sports Medicine

## 2021-08-22 ENCOUNTER — Other Ambulatory Visit: Payer: Self-pay

## 2021-08-22 ENCOUNTER — Ambulatory Visit (INDEPENDENT_AMBULATORY_CARE_PROVIDER_SITE_OTHER): Payer: Medicare HMO

## 2021-08-22 ENCOUNTER — Ambulatory Visit (INDEPENDENT_AMBULATORY_CARE_PROVIDER_SITE_OTHER): Payer: Medicare HMO | Admitting: Family Medicine

## 2021-08-22 VITALS — BP 120/70 | HR 68 | Temp 97.2°F | Resp 16 | Ht 63.0 in | Wt 231.0 lb

## 2021-08-22 DIAGNOSIS — G8929 Other chronic pain: Secondary | ICD-10-CM

## 2021-08-22 DIAGNOSIS — Z23 Encounter for immunization: Secondary | ICD-10-CM

## 2021-08-22 DIAGNOSIS — E1142 Type 2 diabetes mellitus with diabetic polyneuropathy: Secondary | ICD-10-CM | POA: Diagnosis not present

## 2021-08-22 DIAGNOSIS — Z6841 Body Mass Index (BMI) 40.0 and over, adult: Secondary | ICD-10-CM | POA: Diagnosis not present

## 2021-08-22 DIAGNOSIS — E1159 Type 2 diabetes mellitus with other circulatory complications: Secondary | ICD-10-CM | POA: Diagnosis not present

## 2021-08-22 DIAGNOSIS — E11628 Type 2 diabetes mellitus with other skin complications: Secondary | ICD-10-CM

## 2021-08-22 DIAGNOSIS — E782 Mixed hyperlipidemia: Secondary | ICD-10-CM

## 2021-08-22 DIAGNOSIS — Z794 Long term (current) use of insulin: Secondary | ICD-10-CM | POA: Diagnosis not present

## 2021-08-22 DIAGNOSIS — I152 Hypertension secondary to endocrine disorders: Secondary | ICD-10-CM | POA: Diagnosis not present

## 2021-08-22 DIAGNOSIS — M545 Low back pain, unspecified: Secondary | ICD-10-CM | POA: Diagnosis not present

## 2021-08-22 DIAGNOSIS — I10 Essential (primary) hypertension: Secondary | ICD-10-CM

## 2021-08-22 MED ORDER — DEXCOM G6 SENSOR MISC
3 refills | Status: DC
Start: 2021-08-22 — End: 2022-08-23

## 2021-08-22 MED ORDER — DEXCOM G6 TRANSMITTER MISC: 1.0000 | 3 refills | Status: DC

## 2021-08-22 MED ORDER — DEXCOM G6 RECEIVER DEVI: 1.0000 | Freq: Three times a day (TID) | 3 refills | Status: DC

## 2021-08-22 NOTE — Progress Notes (Signed)
Subjective:  Patient ID: Brandi Velazquez, female    DOB: 1960/09/30  Age: 60 y.o. MRN: [id number]  Chief Complaint  Patient presents with   Diabetes   Hypertension   Hyperlipidemia   Gastroesophageal Reflux    HPI Patient is a 60 year old white female with past medical history of diabetes complicated by neuropathy, COPD, sleep apnea, hyperlipidemia, and hypertension who presents for chronic follow-up.  Her diabetes has been well controlled.  She does not check her sugars.  Her last A1c was 6.9.  Patient checks her feet daily.  She eats healthy.  Her last eye exam was March 07, 2021.  Her diabetes medications include farxiga 10 mg once daily, metformin 1000 mg twice daily.  Tresiba 30 units once daily, Actos 30 mg daily, Ozempic 2 mg once weekly subcu.  She is on ramipril 2.5 mg for nephropathy.  Hyperlipidemia: Currently on pravastatin 40 mg once daily.  Patient has a problem with chronic peripheral edema.  She is on spironolactone 25 mg once daily.  Her edema has been fairly well controlled more recently.  Patient is chronic back pain as well as heel pain related to heel spurs.  She is currently on meloxicam 15 mg once daily.  She is not taking ibuprofen or tramadol.  Current Outpatient Medications on File Prior to Visit  Medication Sig Dispense Refill   albuterol (VENTOLIN HFA) 108 (90 Base) MCG/ACT inhaler Inhale 2 puffs into the lungs every 6 (six) hours as needed for wheezing or shortness of breath. 8 g 2   dapagliflozin propanediol (FARXIGA) 10 MG TABS tablet Take 1 tablet (10 mg total) by mouth daily before breakfast. 90 tablet 1   estradiol (ESTRACE VAGINAL) 0.1 MG/GM vaginal cream Apply pea-size amount to vaginal nightly for two weeks, then twice weekly 42.5 g 12   glucose blood (RELION GLUCOSE TEST STRIPS) test strip Use as instructed 100 each 2   ibuprofen (ADVIL) 800 MG tablet Take 1 tablet (800 mg total) by mouth every 8 (eight) hours as needed. 30 tablet 0   insulin degludec  (TRESIBA FLEXTOUCH) 200 UNIT/ML FlexTouch Pen Inject 30 Units into the skin daily. (Patient taking differently: Inject 20 Units into the skin daily.) 15 mL 0   meloxicam (MOBIC) 15 MG tablet TAKE ONE TABLET BY MOUTH every morning 30 tablet 1   metFORMIN (GLUCOPHAGE) 1000 MG tablet TAKE ONE TABLET BY MOUTH TWICE DAILY WITH A MEAL 180 tablet 1   Multiple Vitamin (MULTIVITAMIN) tablet Take 1 tablet by mouth daily.     pioglitazone (ACTOS) 30 MG tablet TAKE ONE TABLET BY MOUTH BEFORE BREAKFAST 90 tablet 1   pravastatin (PRAVACHOL) 40 MG tablet TAKE ONE TABLET BY MOUTH EVERYDAY AT BEDTIME 90 tablet 1   ramipril (ALTACE) 2.5 MG capsule Take 1 capsule (2.5 mg total) by mouth daily. 90 capsule 1   Semaglutide (OZEMPIC, 2 MG/DOSE, [place]) Inject 2 mg into the skin once a week. (Patient not taking: Reported on 06/01/2021)     spironolactone (ALDACTONE) 25 MG tablet TAKE ONE TABLET BY MOUTH BEFORE BREAKFAST 90 tablet 1   traMADol (ULTRAM) 50 MG tablet Take 1 - 2 tablets every 6 hours as needed for pain     Current Facility-Administered Medications on File Prior to Visit  Medication Dose Route Frequency Provider Last Rate Last Admin   ipratropium-albuterol (DUONEB) 0.5-2.5 (3) MG/3ML nebulizer solution 3 mL  3 mL Nebulization Once Rip Harbour, NP       Past Medical History:  Diagnosis  Date   Ankle pain    Chronic pain    back   COPD (chronic obstructive pulmonary disease) (HCC)    Depression    Diabetes mellitus    GERD (gastroesophageal reflux disease)    Hyperlipidemia    Hypertension    Low back pain    Obstructive sleep apnea    Pneumonia 2010   Post-menopausal atrophic vaginitis    Superficial phlebitis    Past Surgical History:  Procedure Laterality Date   BACK SURGERY  1990   CESAREAN SECTION     CYST REMOVAL HAND     inner thigh   SPINE SURGERY     lumbar laminectomy and diskectomy    Family History  Problem Relation Age of Onset   Diabetes Mother    Heart disease Mother     Cancer Father    Stroke Sister    Social History   Socioeconomic History   Marital status: Single    Spouse name: Not on file   Number of children: Not on file   Years of education: Not on file   Highest education level: Not on file  Occupational History   Not on file  Tobacco Use   Smoking status: Former    Types: Cigarettes    Quit date: 03/20/2009    Years since quitting: 12.4   Smokeless tobacco: Never  Vaping Use   Vaping Use: Never used  Substance and Sexual Activity   Alcohol use: No   Drug use: No   Sexual activity: Not on file  Other Topics Concern   Not on file  Social History Narrative   Not on file   Social Determinants of Health   Financial Resource Strain: Not on file  Food Insecurity: Unknown   Worried About Running Out of Food in the Last Year: Not on file   [employer] of Food in the Last Year: Never true  Transportation Needs: No Transportation Needs   Lack of Transportation (Medical): No   Lack of Transportation (Non-Medical): No  Physical Activity: Not on file  Stress: Not on file  Social Connections: Not on file    Review of Systems  Constitutional:  Negative for chills, fatigue and fever.  HENT:  Negative for congestion, rhinorrhea and sore throat.   Respiratory:  Negative for cough and shortness of breath.   Cardiovascular:  Negative for chest pain.  Gastrointestinal:  Negative for abdominal pain, constipation, diarrhea, nausea and vomiting.  Endocrine: Negative for polydipsia, polyphagia and polyuria.  Genitourinary:  Negative for dysuria and urgency.  Musculoskeletal:  Positive for back pain. Negative for myalgias.  Neurological:  Negative for dizziness, weakness, light-headedness and headaches.  Psychiatric/Behavioral:  Negative for dysphoric mood. The patient is not nervous/anxious.     Objective:  BP 120/70   Pulse 68   Temp (!) 97.2 F (36.2 C)   Resp 16   Ht 5\' 3"  (1.6 m)   Wt 231 lb (104.8 kg)   BMI 40.92 kg/m   BP/Weight  08/22/2021 07/18/2021 2/75/1700  Systolic BP 174 944 967  Diastolic BP 70 68 62  Wt. (Lbs) 231 233 235  BMI 40.92 41.27 41.63    Physical Exam Vitals reviewed.  Constitutional:      Appearance: Normal appearance. She is obese.  Neck:     Vascular: No carotid bruit.  Cardiovascular:     Rate and Rhythm: Normal rate and regular rhythm.     Pulses: Normal pulses.     Heart sounds:  Normal heart sounds.  Pulmonary:     Effort: Pulmonary effort is normal. No respiratory distress.     Breath sounds: Normal breath sounds.  Abdominal:     General: Abdomen is flat. Bowel sounds are normal.     Palpations: Abdomen is soft.     Tenderness: There is no abdominal tenderness.  Skin:    Comments: Stasis dermatitis bilateral lower extremities.  Neurological:     Mental Status: She is alert and oriented to person, place, and time.  Psychiatric:        Mood and Affect: Mood normal.        Behavior: Behavior normal.    Diabetic Foot Exam - Simple   Simple Foot Form Diabetic Foot exam was performed with the following findings: Yes 08/22/2021 11:22 AM  Visual Inspection No deformities, no ulcerations, no other skin breakdown bilaterally: Yes Sensation Testing Intact to touch and monofilament testing bilaterally: Yes Pulse Check Posterior Tibialis and Dorsalis pulse intact bilaterally: Yes Comments      Lab Results  Component Value Date   WBC 9.7 08/22/2021   HGB 14.9 08/22/2021   HCT 45.1 08/22/2021   PLT 218 08/22/2021   GLUCOSE 100 (H) 08/22/2021   CHOL 139 08/22/2021   TRIG 70 08/22/2021   HDL 53 08/22/2021   LDLCALC 72 08/22/2021   ALT 15 08/22/2021   AST 12 08/22/2021   NA 142 08/22/2021   K 4.8 08/22/2021   CL 107 (H) 08/22/2021   CREATININE 0.67 08/22/2021   BUN 18 08/22/2021   CO2 26 08/22/2021   HGBA1C 6.8 (H) 08/22/2021   MICROALBUR 30 05/18/2021      Assessment & Plan:   Problem List Items Addressed This Visit       Cardiovascular and Mediastinum    Hypertension associated with diabetes ([place])    Well controlled.  No changes to medicines.  Continue to work on eating a healthy diet and exercise.  Labs drawn today.         Endocrine   Diabetic polyneuropathy associated with type 2 diabetes mellitus (HCC)    Control: good Recommend CGM as is on numerous medications and insulin.  Recommend check feet daily. Recommend annual eye exams. Medicines: no changes Continue to work on eating a healthy diet and exercise.  Labs drawn today.         Relevant Medications   Continuous Blood Gluc Receiver (DEXCOM G6 RECEIVER) DEVI   Continuous Blood Gluc Sensor (DEXCOM G6 SENSOR) MISC   Continuous Blood Gluc Transmit (DEXCOM G6 TRANSMITTER) MISC   Other Relevant Orders   Hemoglobin A1c (Completed)   Cardiovascular Risk Assessment (Completed)     Other   Chronic midline low back pain without sciatica    Continue meloxicam.      Morbid obesity with BMI of 40.0-44.9, adult (HCC)    Recommend continue to work on eating healthy diet and exercise.          Hyperlipidemia - Primary    Well controlled.  No changes to medicines.  Continue to work on eating a healthy diet and exercise.  Labs drawn today.       Relevant Orders   CBC with Differential/Platelet (Completed)   Comprehensive metabolic panel (Completed)   Lipid panel (Completed)  .  Meds ordered this encounter  Medications   Continuous Blood Gluc Receiver ([place]) DEVI    Sig: 1 each by Does not apply route in the morning, at noon, and at bedtime.  Dispense:  1 each    Refill:  3   Continuous Blood Gluc Sensor (DEXCOM G6 SENSOR) MISC    Sig: Check sugars qac and qhs.    Dispense:  9 each    Refill:  3   Continuous Blood Gluc Transmit (DEXCOM G6 TRANSMITTER) MISC    Sig: 1 each by Does not apply route every 3 (three) months.    Dispense:  1 each    Refill:  3    Orders Placed This Encounter  Procedures   CBC with Differential/Platelet    Comprehensive metabolic panel   Hemoglobin A1c   Lipid panel   Cardiovascular Risk Assessment     Follow-up: No follow-ups on file.  An After Visit Summary was printed and given to the patient.  [name], MD [facility] [phone]

## 2021-08-22 NOTE — Patient Instructions (Signed)
Dexcom prescription sent to [facility]. They should be calling you.

## 2021-08-23 LAB — CBC WITH DIFFERENTIAL/PLATELET
Basophils Absolute: 0 10*3/uL (ref 0.0–0.2)
Basos: 0 %
EOS (ABSOLUTE): 0.2 10*3/uL (ref 0.0–0.4)
Eos: 2 %
Hematocrit: 45.1 % (ref 34.0–46.6)
Hemoglobin: 14.9 g/dL (ref 11.1–15.9)
Immature Grans (Abs): 0 10*3/uL (ref 0.0–0.1)
Immature Granulocytes: 0 %
Lymphocytes Absolute: 3 10*3/uL (ref 0.7–3.1)
Lymphs: 31 %
MCH: 28.8 pg (ref 26.6–33.0)
MCHC: 33 g/dL (ref 31.5–35.7)
MCV: 87 fL (ref 79–97)
Monocytes Absolute: 0.8 10*3/uL (ref 0.1–0.9)
Monocytes: 8 %
Neutrophils Absolute: 5.6 10*3/uL (ref 1.4–7.0)
Neutrophils: 59 %
Platelets: 218 10*3/uL (ref 150–450)
RBC: 5.17 x10E6/uL (ref 3.77–5.28)
RDW: 14.1 % (ref 11.7–15.4)
WBC: 9.7 10*3/uL (ref 3.4–10.8)

## 2021-08-23 LAB — COMPREHENSIVE METABOLIC PANEL
ALT: 15 IU/L (ref 0–32)
AST: 12 IU/L (ref 0–40)
Albumin/Globulin Ratio: 1.8 (ref 1.2–2.2)
Albumin: 4.3 g/dL (ref 3.8–4.8)
Alkaline Phosphatase: 55 IU/L (ref 44–121)
BUN/Creatinine Ratio: 27 (ref 12–28)
BUN: 18 mg/dL (ref 8–27)
Bilirubin Total: 0.4 mg/dL (ref 0.0–1.2)
CO2: 26 mmol/L (ref 20–29)
Calcium: 9.5 mg/dL (ref 8.7–10.3)
Chloride: 107 mmol/L — ABNORMAL HIGH (ref 96–106)
Creatinine, Ser: 0.67 mg/dL (ref 0.57–1.00)
Globulin, Total: 2.4 g/dL (ref 1.5–4.5)
Glucose: 100 mg/dL — ABNORMAL HIGH (ref 70–99)
Potassium: 4.8 mmol/L (ref 3.5–5.2)
Sodium: 142 mmol/L (ref 134–144)
Total Protein: 6.7 g/dL (ref 6.0–8.5)
eGFR: 99 mL/min/{1.73_m2} (ref >59)

## 2021-08-23 LAB — LIPID PANEL
Chol/HDL Ratio: 2.6 ratio (ref 0.0–4.4)
Cholesterol, Total: 139 mg/dL (ref 100–199)
HDL: 53 mg/dL (ref >39)
LDL Chol Calc (NIH): 72 mg/dL (ref 0–99)
Triglycerides: 70 mg/dL (ref 0–149)
VLDL Cholesterol Cal: 14 mg/dL (ref 5–40)

## 2021-08-23 LAB — HEMOGLOBIN A1C
Est. average glucose Bld gHb Est-mCnc: 148 mg/dL
Hgb A1c MFr Bld: 6.8 % — ABNORMAL HIGH (ref 4.8–5.6)

## 2021-08-23 LAB — CARDIOVASCULAR RISK ASSESSMENT

## 2021-08-25 ENCOUNTER — Telehealth: Payer: Self-pay

## 2021-08-25 NOTE — Progress Notes (Signed)
    Chronic Care Management Pharmacy Assistant   Name: Brandi Velazquez  MRN: [id number] DOB: June 01, 1961   Reason for Encounter: No fill report  08/25/21 Updated no fill report that pt is not currently taking Glimepiride 4 Mg due to taking an increased dosage of Ozempic according to her chart notes.     Medications: Outpatient Encounter Medications as of 08/25/2021  Medication Sig Note   albuterol (VENTOLIN HFA) 108 (90 Base) MCG/ACT inhaler Inhale 2 puffs into the lungs every 6 (six) hours as needed for wheezing or shortness of breath.    Continuous Blood Gluc Receiver ([place]) DEVI 1 each by Does not apply route in the morning, at noon, and at bedtime.    Continuous Blood Gluc Sensor (DEXCOM G6 SENSOR) MISC Check sugars qac and qhs.    Continuous Blood Gluc Transmit (DEXCOM G6 TRANSMITTER) MISC 1 each by Does not apply route every 3 (three) months.    dapagliflozin propanediol (FARXIGA) 10 MG TABS tablet Take 1 tablet (10 mg total) by mouth daily before breakfast.    estradiol (ESTRACE VAGINAL) 0.1 MG/GM vaginal cream Apply pea-size amount to vaginal nightly for two weeks, then twice weekly    glucose blood (RELION GLUCOSE TEST STRIPS) test strip Use as instructed    ibuprofen (ADVIL) 800 MG tablet Take 1 tablet (800 mg total) by mouth every 8 (eight) hours as needed.    insulin degludec (TRESIBA FLEXTOUCH) 200 UNIT/ML FlexTouch Pen Inject 30 Units into the skin daily. (Patient taking differently: Inject 20 Units into the skin daily.) 05/23/2021: Per Dr. Alyse Low recommendation patient is to decrease to 20 units daily when beginning Ozempic 2 mg weekly   meloxicam (MOBIC) 15 MG tablet TAKE ONE TABLET BY MOUTH every morning    metFORMIN (GLUCOPHAGE) 1000 MG tablet TAKE ONE TABLET BY MOUTH TWICE DAILY WITH A MEAL    Multiple Vitamin (MULTIVITAMIN) tablet Take 1 tablet by mouth daily.    pioglitazone (ACTOS) 30 MG tablet TAKE ONE TABLET BY MOUTH BEFORE BREAKFAST    pravastatin  (PRAVACHOL) 40 MG tablet TAKE ONE TABLET BY MOUTH EVERYDAY AT BEDTIME    ramipril (ALTACE) 2.5 MG capsule Take 1 capsule (2.5 mg total) by mouth daily.    Semaglutide (OZEMPIC, 2 MG/DOSE, [place]) Inject 2 mg into the skin once a week. (Patient not taking: Reported on 06/01/2021)    spironolactone (ALDACTONE) 25 MG tablet TAKE ONE TABLET BY MOUTH BEFORE BREAKFAST    traMADol (ULTRAM) 50 MG tablet Take 1 - 2 tablets every 6 hours as needed for pain    Facility-Administered Encounter Medications as of 08/25/2021  Medication   ipratropium-albuterol (DUONEB) 0.5-2.5 (3) MG/3ML nebulizer solution 3 mL   [name], [place] Pharmacist Assistant  [phone]

## 2021-08-26 NOTE — Assessment & Plan Note (Signed)
Well controlled.  ?No changes to medicines.  ?Continue to work on eating a healthy diet and exercise.  ?Labs drawn today.  ?

## 2021-08-26 NOTE — Assessment & Plan Note (Signed)
Control:  Controlled. Recommend check sugars fasting daily. Recommend check feet daily. Recommend annual eye exams. Medicines: No changes in the medication. Continue to work on eating a healthy diet and exercise.  Labs drawn today.

## 2021-08-28 NOTE — Telephone Encounter (Signed)
Sulfonylurea no longer on meds list, will improve compliance for 2023

## 2021-09-02 ENCOUNTER — Encounter: Payer: Self-pay | Admitting: Family Medicine

## 2021-09-02 NOTE — Assessment & Plan Note (Signed)
Continue meloxicam

## 2021-09-02 NOTE — Assessment & Plan Note (Signed)
Control: good Recommend CGM as is on numerous medications and insulin.  Recommend check feet daily. Recommend annual eye exams. Medicines: no changes Continue to work on eating a healthy diet and exercise.  Labs drawn today.

## 2021-09-02 NOTE — Assessment & Plan Note (Signed)
Recommend continue to work on eating healthy diet and exercise.  

## 2021-09-22 ENCOUNTER — Telehealth: Payer: Self-pay

## 2021-09-22 NOTE — Progress Notes (Signed)
Chronic Care Management Pharmacy Assistant   Name: TRANG BOUSE  MRN: [id number] DOB: [birth year]   Reason for Encounter: Medication Coordination for [facility]    Recent office visits:  08/22/21 [name],Kirsten MD. Seen for Routine Visit. D/C Benzonatate 200 mg 3 times daily prn. D/C Glimepiride 4 mg daily.   Recent consult visits:  None   Hospital visits:  None   Medications: Outpatient Encounter Medications as of 09/22/2021  Medication Sig Note   albuterol (VENTOLIN HFA) 108 (90 Base) MCG/ACT inhaler Inhale 2 puffs into the lungs every 6 (six) hours as needed for wheezing or shortness of breath.    Continuous Blood Gluc Receiver ([place]) DEVI 1 each by Does not apply route in the morning, at noon, and at bedtime.    Continuous Blood Gluc Sensor (DEXCOM G6 SENSOR) MISC Check sugars qac and qhs.    Continuous Blood Gluc Transmit (DEXCOM G6 TRANSMITTER) MISC 1 each by Does not apply route every 3 (three) months.    dapagliflozin propanediol (FARXIGA) 10 MG TABS tablet Take 1 tablet (10 mg total) by mouth daily before breakfast.    estradiol (ESTRACE VAGINAL) 0.1 MG/GM vaginal cream Apply pea-size amount to vaginal nightly for two weeks, then twice weekly    glucose blood (RELION GLUCOSE TEST STRIPS) test strip Use as instructed    ibuprofen (ADVIL) 800 MG tablet Take 1 tablet (800 mg total) by mouth every 8 (eight) hours as needed.    insulin degludec (TRESIBA FLEXTOUCH) 200 UNIT/ML FlexTouch Pen Inject 30 Units into the skin daily. (Patient taking differently: Inject 20 Units into the skin daily.) 05/23/2021: Per Dr. Alyse Low recommendation patient is to decrease to 20 units daily when beginning Ozempic 2 mg weekly   meloxicam (MOBIC) 15 MG tablet TAKE ONE TABLET BY MOUTH every morning    metFORMIN (GLUCOPHAGE) 1000 MG tablet TAKE ONE TABLET BY MOUTH TWICE DAILY WITH A MEAL    Multiple Vitamin (MULTIVITAMIN) tablet Take 1 tablet by mouth daily.    pioglitazone (ACTOS) 30 MG tablet  TAKE ONE TABLET BY MOUTH BEFORE BREAKFAST    pravastatin (PRAVACHOL) 40 MG tablet TAKE ONE TABLET BY MOUTH EVERYDAY AT BEDTIME    ramipril (ALTACE) 2.5 MG capsule Take 1 capsule (2.5 mg total) by mouth daily.    Semaglutide (OZEMPIC, 2 MG/DOSE, [place]) Inject 2 mg into the skin once a week. (Patient not taking: Reported on 06/01/2021)    spironolactone (ALDACTONE) 25 MG tablet TAKE ONE TABLET BY MOUTH BEFORE BREAKFAST    traMADol (ULTRAM) 50 MG tablet Take 1 - 2 tablets every 6 hours as needed for pain    Facility-Administered Encounter Medications as of 09/22/2021  Medication   ipratropium-albuterol (DUONEB) 0.5-2.5 (3) MG/3ML nebulizer solution 3 mL   Reviewed chart for medication changes ahead of medication coordination call.  No Consults, or hospital visits since last care coordination call/Pharmacist visit.  BP Readings from Last 3 Encounters:  08/22/21 120/70  07/18/21 138/68  06/06/21 110/62    Lab Results  Component Value Date   HGBA1C 6.8 (H) 08/22/2021     Patient obtains medications through Adherence Packaging  90 Days   Last adherence delivery included:  Meloxicam 15 mg 1 tablet at breakfast   Patient declined (meds) last delivery due  Glimepiride 4 mg pt was told to hold this medication once she gets her Ozempic 2 mg and pt has started this so she does not need the Glimepiride.  Pravastatin 40 mg- 1 tablet daily (bedtime)  Has enough for next delivery Pioglitazone 30 mg- 1 tablet daily  (before breakfast) Has enough for next delivery Spironolactone 25 mg- 1 tablet daily (before breakfast) Has enough for next delivery Metformin 1000 mg- 1 tablet twice daily (breakfast, evening meal) Has enough for next delivery Ramipril 2.5 mg-1 tablet daily (breakfast Has enough for next delivery  Patient is due for next adherence delivery on: 10/04/21. Called patient and reviewed medications and coordinated delivery.  This delivery to include: Pravastatin 40 mg- 1 tablet daily  (bedtime) Pioglitazone 30 mg- 1 tablet daily  (before breakfast) Spironolactone 25 mg- 1 tablet daily (before breakfast) Metformin 1000 mg- 1 tablet twice daily (breakfast, evening meal) Ramipril 2.5 mg-1 tablet daily (breakfast) Meloxicam 15 mg 1 tablet at (breakfast)  Patient declined the following medications  Patient gets Patient Assistance for the following: Farxiga 10mg  Ozempic 2 mg Tresiba 200 units Alubterol 108 Inhaler Has plenty of supply. Only uses PRN last fill 07/18/21 25ds Duoneb inhaler Has plenty of supply. Only uses PRN   Patient needs refills for  Spironolactone 25 mg Metformin 1000 mg Ramipril 2.5 mg Meloxicam 15 mg Pravastatin 40 mg  Confirmed delivery date of 10/04/21, advised patient that pharmacy will contact them the morning of delivery.  [name], [place] Pharmacist Assistant  [phone]

## 2021-09-25 NOTE — Telephone Encounter (Signed)
Compliant on meds 

## 2021-09-27 ENCOUNTER — Telehealth: Payer: Self-pay

## 2021-09-27 ENCOUNTER — Ambulatory Visit (INDEPENDENT_AMBULATORY_CARE_PROVIDER_SITE_OTHER): Payer: Medicare HMO | Admitting: Family Medicine

## 2021-09-27 ENCOUNTER — Encounter: Payer: Self-pay | Admitting: Family Medicine

## 2021-09-27 VITALS — BP 110/60 | HR 81 | Temp 98.5°F | Resp 15 | Ht 63.0 in | Wt 223.0 lb

## 2021-09-27 DIAGNOSIS — J208 Acute bronchitis due to other specified organisms: Secondary | ICD-10-CM

## 2021-09-27 DIAGNOSIS — R0602 Shortness of breath: Secondary | ICD-10-CM | POA: Diagnosis not present

## 2021-09-27 DIAGNOSIS — R079 Chest pain, unspecified: Secondary | ICD-10-CM | POA: Diagnosis not present

## 2021-09-27 DIAGNOSIS — R059 Cough, unspecified: Secondary | ICD-10-CM | POA: Diagnosis not present

## 2021-09-27 DIAGNOSIS — R0600 Dyspnea, unspecified: Secondary | ICD-10-CM | POA: Diagnosis not present

## 2021-09-27 MED ORDER — TRIAMCINOLONE ACETONIDE 40 MG/ML IJ SUSP
80.0000 mg | Freq: Once | INTRAMUSCULAR | Status: AC
  Administered 2021-09-27: 80 mg via INTRAMUSCULAR

## 2021-09-27 MED ORDER — CEFTRIAXONE SODIUM 1 G IJ SOLR
1.0000 g | Freq: Once | INTRAMUSCULAR | Status: AC
  Administered 2021-09-27: 1 g via INTRAMUSCULAR

## 2021-09-27 MED ORDER — CEFDINIR 300 MG PO CAPS: 300.0000 mg | ORAL_CAPSULE | Freq: Two times a day (BID) | ORAL | 0 refills | Status: DC

## 2021-09-27 MED ORDER — AZITHROMYCIN 250 MG PO TABS: ORAL_TABLET | ORAL | 0 refills | Status: AC

## 2021-09-27 NOTE — Progress Notes (Signed)
Acute Office Visit  Subjective:    Patient ID: Brandi Velazquez, female    DOB: October 14, 1960, 61 y.o.   MRN: [id number]  Chief Complaint  Patient presents with   Cough   Sinusitis    HPI: Patient is in today for coughing, fatigue, sinus pain and pressure, ear fullness and thoracic pain on left sided since she got the covid booster on 08/22/2021. She mentioned that has been progressive since the day after. She denied chest pain, SOB, fever, chills. This has been going on for over 4 weeks. Pt has tried otc meds. Helps while she takes them, but symptoms return. She was feeling better, but than developed the pain in her thoracic back pain.   Past Medical History:  Diagnosis Date   Ankle pain    Chronic pain    back   COPD (chronic obstructive pulmonary disease) (HCC)    Depression    Diabetes mellitus    GERD (gastroesophageal reflux disease)    Hyperlipidemia    Hypertension    Low back pain    Obstructive sleep apnea    Pneumonia 2010   Post-menopausal atrophic vaginitis    Superficial phlebitis     Past Surgical History:  Procedure Laterality Date   BACK SURGERY  1990   CESAREAN SECTION     CYST REMOVAL HAND     inner thigh   SPINE SURGERY     lumbar laminectomy and diskectomy    Family History  Problem Relation Age of Onset   Diabetes Mother    Heart disease Mother    Cancer Father    Stroke Sister     Social History   Socioeconomic History   Marital status: Single    Spouse name: Not on file   Number of children: Not on file   Years of education: Not on file   Highest education level: Not on file  Occupational History   Not on file  Tobacco Use   Smoking status: Former    Types: Cigarettes    Quit date: 03/20/2009    Years since quitting: 12.5   Smokeless tobacco: Never  Vaping Use   Vaping Use: Never used  Substance and Sexual Activity   Alcohol use: No   Drug use: No   Sexual activity: Not on file  Other Topics Concern   Not on file  Social  History Narrative   Not on file   Social Determinants of Health   Financial Resource Strain: Not on file  Food Insecurity: Unknown   Worried About Running Out of Food in the Last Year: Not on file   [employer] of Food in the Last Year: Never true  Transportation Needs: No Transportation Needs   Lack of Transportation (Medical): No   Lack of Transportation (Non-Medical): No  Physical Activity: Not on file  Stress: Not on file  Social Connections: Not on file  Intimate Partner Violence: Not on file    Outpatient Medications Prior to Visit  Medication Sig Dispense Refill   albuterol (VENTOLIN HFA) 108 (90 Base) MCG/ACT inhaler Inhale 2 puffs into the lungs every 6 (six) hours as needed for wheezing or shortness of breath. 8 g 2   Continuous Blood Gluc Receiver (DEXCOM G6 RECEIVER) DEVI 1 each by Does not apply route in the morning, at noon, and at bedtime. 1 each 3   Continuous Blood Gluc Sensor (DEXCOM G6 SENSOR) MISC Check sugars qac and qhs. 9 each 3   Continuous Blood Gluc  Transmit (DEXCOM G6 TRANSMITTER) MISC 1 each by Does not apply route every 3 (three) months. 1 each 3   dapagliflozin propanediol (FARXIGA) 10 MG TABS tablet Take 1 tablet (10 mg total) by mouth daily before breakfast. 90 tablet 1   estradiol (ESTRACE VAGINAL) 0.1 MG/GM vaginal cream Apply pea-size amount to vaginal nightly for two weeks, then twice weekly 42.5 g 12   glucose blood (RELION GLUCOSE TEST STRIPS) test strip Use as instructed 100 each 2   ibuprofen (ADVIL) 800 MG tablet Take 1 tablet (800 mg total) by mouth every 8 (eight) hours as needed. 30 tablet 0   insulin degludec (TRESIBA FLEXTOUCH) 200 UNIT/ML FlexTouch Pen Inject 30 Units into the skin daily. (Patient taking differently: Inject 20 Units into the skin daily.) 15 mL 0   meloxicam (MOBIC) 15 MG tablet TAKE ONE TABLET BY MOUTH every morning 30 tablet 1   metFORMIN (GLUCOPHAGE) 1000 MG tablet TAKE ONE TABLET BY MOUTH TWICE DAILY WITH A MEAL 180 tablet 1    Multiple Vitamin (MULTIVITAMIN) tablet Take 1 tablet by mouth daily.     pioglitazone (ACTOS) 30 MG tablet TAKE ONE TABLET BY MOUTH BEFORE BREAKFAST 90 tablet 1   pravastatin (PRAVACHOL) 40 MG tablet TAKE ONE TABLET BY MOUTH EVERYDAY AT BEDTIME 90 tablet 1   ramipril (ALTACE) 2.5 MG capsule Take 1 capsule (2.5 mg total) by mouth daily. 90 capsule 1   Semaglutide (OZEMPIC, 2 MG/DOSE, [place]) Inject 2 mg into the skin once a week.     spironolactone (ALDACTONE) 25 MG tablet TAKE ONE TABLET BY MOUTH BEFORE BREAKFAST 90 tablet 1   traMADol (ULTRAM) 50 MG tablet Take 1 - 2 tablets every 6 hours as needed for pain     Facility-Administered Medications Prior to Visit  Medication Dose Route Frequency Provider Last Rate Last Admin   ipratropium-albuterol (DUONEB) 0.5-2.5 (3) MG/3ML nebulizer solution 3 mL  3 mL Nebulization Once Rip Harbour, NP        Allergies  Allergen Reactions   Bactrim [Sulfamethoxazole-Trimethoprim] Swelling   Morphine And Related     Review of Systems  Constitutional:  Positive for fatigue. Negative for chills and fever.  HENT:  Positive for congestion, sinus pressure and sinus pain. Negative for ear pain and sore throat.   Respiratory:  Positive for cough. Negative for shortness of breath.   Cardiovascular:  Negative for chest pain and palpitations.  Gastrointestinal:  Positive for vomiting (coughing). Negative for abdominal pain, constipation, diarrhea and nausea.  Endocrine: Negative for polydipsia, polyphagia and polyuria.  Genitourinary:  Negative for difficulty urinating and dysuria.  Musculoskeletal:  Negative for arthralgias, back pain and myalgias.  Skin:  Negative for rash.  Neurological:  Negative for headaches.  Psychiatric/Behavioral:  Negative for dysphoric mood. The patient is not nervous/anxious.       Objective:    Physical Exam Vitals reviewed.  Constitutional:      Appearance: Normal appearance. She is normal weight.  HENT:     Right Ear:  Tympanic membrane, ear canal and external ear normal.     Left Ear: Tympanic membrane, ear canal and external ear normal.     Nose: Nose normal.     Mouth/Throat:     Pharynx: Oropharynx is clear.  Neck:     Vascular: No carotid bruit.  Cardiovascular:     Rate and Rhythm: Normal rate and regular rhythm.     Pulses: Normal pulses.     Heart sounds: Normal heart sounds. No  murmur heard. Pulmonary:     Effort: Pulmonary effort is normal. No respiratory distress.     Breath sounds: Wheezing (diffuse. Coughing with inspiration.) present.  Neurological:     Mental Status: She is alert and oriented to person, place, and time.  Psychiatric:        Mood and Affect: Mood normal.        Behavior: Behavior normal.    BP 110/60    Pulse 81    Temp 98.5 F (36.9 C)    Resp 15    Ht 5' 3"  (1.6 m)    Wt 223 lb (101.2 kg)    SpO2 98%    BMI 39.50 kg/m  Wt Readings from Last 3 Encounters:  09/27/21 223 lb (101.2 kg)  08/22/21 231 lb (104.8 kg)  07/18/21 233 lb (105.7 kg)    Health Maintenance Due  Topic Date Due   DEXA SCAN  Never done   HIV Screening  Never done   COVID-19 Vaccine (3 - Moderna risk series) 08/22/2021    There are no preventive care reminders to display for this patient.   No results found for: TSH Lab Results  Component Value Date   WBC 9.7 08/22/2021   HGB 14.9 08/22/2021   HCT 45.1 08/22/2021   MCV 87 08/22/2021   PLT 218 08/22/2021   Lab Results  Component Value Date   NA 142 08/22/2021   K 4.8 08/22/2021   CO2 26 08/22/2021   GLUCOSE 100 (H) 08/22/2021   BUN 18 08/22/2021   CREATININE 0.67 08/22/2021   BILITOT 0.4 08/22/2021   ALKPHOS 55 08/22/2021   AST 12 08/22/2021   ALT 15 08/22/2021   PROT 6.7 08/22/2021   ALBUMIN 4.3 08/22/2021   CALCIUM 9.5 08/22/2021   EGFR 99 08/22/2021   Lab Results  Component Value Date   CHOL 139 08/22/2021   Lab Results  Component Value Date   HDL 53 08/22/2021   Lab Results  Component Value Date   LDLCALC  72 08/22/2021   Lab Results  Component Value Date   TRIG 70 08/22/2021   Lab Results  Component Value Date   CHOLHDL 2.6 08/22/2021   Lab Results  Component Value Date   HGBA1C 6.8 (H) 08/22/2021       Assessment & Plan:   Problem List Items Addressed This Visit   None Visit Diagnoses     Shortness of breath    -  Primary   Acute bronchitis due to other specified organisms       Relevant Medications   triamcinolone acetonide (KENALOG-40) injection 80 mg (Start on 09/27/2021 11:30 AM)   cefTRIAXone (ROCEPHIN) injection 1 g (Start on 09/27/2021 11:30 AM)   cefdinir (OMNICEF) 300 MG capsule   azithromycin (ZITHROMAX) 250 MG tablet   Other Relevant Orders   DG Chest 2 View      Meds ordered this encounter  Medications   triamcinolone acetonide (KENALOG-40) injection 80 mg   cefTRIAXone (ROCEPHIN) injection 1 g   cefdinir (OMNICEF) 300 MG capsule    Sig: Take 1 capsule (300 mg total) by mouth 2 (two) times daily.    Dispense:  20 capsule    Refill:  0   azithromycin (ZITHROMAX) 250 MG tablet    Sig: Take 2 tablets on day 1, then 1 tablet daily on days 2 through 5    Dispense:  6 tablet    Refill:  0    Orders Placed This Encounter  Procedures   DG Chest 2 View     Follow-up: Return if symptoms worsen or fail to improve.  An After Visit Summary was printed and given to the patient.  [name], MD [facility] [phone]

## 2021-09-27 NOTE — Patient Instructions (Signed)
Given rocephin and kenalog shots.  2.   Sent cefdinir and zpack for antibiotics.  3.   Recommend use albuterol inhaler 2 puffs four times a day for 2 days, then change to 2 puffs four times a day as needed.  4.  Get chest xray done.

## 2021-09-27 NOTE — Chronic Care Management (AMB) (Signed)
Chronic Care Management Pharmacy Assistant   Name: Brandi Velazquez  MRN: [id number] DOB: Nov 19, 1960   Reason for Encounter: Patient Assistance Coordination  09/27/2021- Patient has questions regarding Ozempic and Farxiga PAP. Called patient to inquire if she returned 2023 paperwork to PCP office to be faxed to [employer]. Patient stated she returned application last month. Patient aware that there is a delay in processing and shipping with [employer]. Patient aware that she was automatically re-enrolled with [employer] for 2023 for her Wilder Glade and I will request a new prescription to be sent to [facility] to have medication shipped out to patient. Patient will stop by PCP office to pick up free 30 day trial coupon and prescription to take to her local pharmacy. I will reach back out to patient regarding Ozempic and Tresiba application status. Patient has 2 weeks left on Ozempic and about 1 month on Tresiba.    09/28/2021- Received message from Rhae Hammock, LPN, application was mailed off on 08/24/2021. Will follow up with [employer] for status. [employer], per [employer] application received but information was missing from healthcare provider page. Rhae Hammock or I will update and get faxed back to [employer].   0/16/0109- Reviewed application again before faxing, patient signature is missing on one page and medication attachment was not sent, called patient to inform. Patient aware, she will stop by the office next week to sign missing area and I will have medication and allergy list available to fax back to [employer].   Medications: Outpatient Encounter Medications as of 09/27/2021  Medication Sig Note   albuterol (VENTOLIN HFA) 108 (90 Base) MCG/ACT inhaler Inhale 2 puffs into the lungs every 6 (six) hours as needed for wheezing or shortness of breath.    azithromycin (ZITHROMAX) 250 MG tablet Take 2 tablets on day 1, then 1 tablet daily on days 2 through 5     cefdinir (OMNICEF) 300 MG capsule Take 1 capsule (300 mg total) by mouth 2 (two) times daily.    Continuous Blood Gluc Receiver ([place]) DEVI 1 each by Does not apply route in the morning, at noon, and at bedtime.    Continuous Blood Gluc Sensor (DEXCOM G6 SENSOR) MISC Check sugars qac and qhs.    Continuous Blood Gluc Transmit (DEXCOM G6 TRANSMITTER) MISC 1 each by Does not apply route every 3 (three) months.    dapagliflozin propanediol (FARXIGA) 10 MG TABS tablet Take 1 tablet (10 mg total) by mouth daily before breakfast.    estradiol (ESTRACE VAGINAL) 0.1 MG/GM vaginal cream Apply pea-size amount to vaginal nightly for two weeks, then twice weekly    glucose blood (RELION GLUCOSE TEST STRIPS) test strip Use as instructed    ibuprofen (ADVIL) 800 MG tablet Take 1 tablet (800 mg total) by mouth every 8 (eight) hours as needed.    insulin degludec (TRESIBA FLEXTOUCH) 200 UNIT/ML FlexTouch Pen Inject 30 Units into the skin daily. (Patient taking differently: Inject 20 Units into the skin daily.) 05/23/2021: Per Dr. Alyse Low recommendation patient is to decrease to 20 units daily when beginning Ozempic 2 mg weekly   meloxicam (MOBIC) 15 MG tablet TAKE ONE TABLET BY MOUTH every morning    metFORMIN (GLUCOPHAGE) 1000 MG tablet TAKE ONE TABLET BY MOUTH TWICE DAILY WITH A MEAL    Multiple Vitamin (MULTIVITAMIN) tablet Take 1 tablet by mouth daily.    pioglitazone (ACTOS) 30 MG tablet TAKE ONE TABLET BY MOUTH BEFORE BREAKFAST  pravastatin (PRAVACHOL) 40 MG tablet TAKE ONE TABLET BY MOUTH EVERYDAY AT BEDTIME    ramipril (ALTACE) 2.5 MG capsule Take 1 capsule (2.5 mg total) by mouth daily.    Semaglutide (OZEMPIC, 2 MG/DOSE, [place]) Inject 2 mg into the skin once a week.    spironolactone (ALDACTONE) 25 MG tablet TAKE ONE TABLET BY MOUTH BEFORE BREAKFAST    traMADol (ULTRAM) 50 MG tablet Take 1 - 2 tablets every 6 hours as needed for pain    Facility-Administered Encounter Medications as of  09/27/2021  Medication   ipratropium-albuterol (DUONEB) 0.5-2.5 (3) MG/3ML nebulizer solution 3 mL   Pattricia Boss, [place] Pharmacist Assistant [phone]

## 2021-09-28 ENCOUNTER — Other Ambulatory Visit: Payer: Self-pay | Admitting: Family Medicine

## 2021-09-28 ENCOUNTER — Other Ambulatory Visit: Payer: Self-pay | Admitting: Nurse Practitioner

## 2021-09-28 ENCOUNTER — Other Ambulatory Visit: Payer: Self-pay | Admitting: Podiatry

## 2021-09-28 DIAGNOSIS — E782 Mixed hyperlipidemia: Secondary | ICD-10-CM

## 2021-10-02 ENCOUNTER — Other Ambulatory Visit: Payer: Self-pay

## 2021-10-02 DIAGNOSIS — E1142 Type 2 diabetes mellitus with diabetic polyneuropathy: Secondary | ICD-10-CM

## 2021-10-02 MED ORDER — DAPAGLIFLOZIN PROPANEDIOL 10 MG PO TABS: 10.0000 mg | ORAL_TABLET | Freq: Every day | ORAL | 3 refills | Status: DC

## 2021-10-03 ENCOUNTER — Other Ambulatory Visit: Payer: Self-pay

## 2021-10-03 DIAGNOSIS — J018 Other acute sinusitis: Secondary | ICD-10-CM

## 2021-10-03 DIAGNOSIS — R062 Wheezing: Secondary | ICD-10-CM

## 2021-10-03 DIAGNOSIS — J208 Acute bronchitis due to other specified organisms: Secondary | ICD-10-CM

## 2021-10-03 MED ORDER — ALBUTEROL SULFATE HFA 108 (90 BASE) MCG/ACT IN AERS: 2.0000 | INHALATION_SPRAY | Freq: Four times a day (QID) | RESPIRATORY_TRACT | 2 refills | Status: DC | PRN

## 2021-10-06 ENCOUNTER — Telehealth: Payer: Self-pay

## 2021-10-06 NOTE — Progress Notes (Signed)
° ° °  Chronic Care Management Pharmacy Assistant   Name: Brandi Velazquez  MRN: [id number] DOB: 09-04-1961  Reason for Encounter: Patient Assistance Documentation   10/06/21- Call [facility] to see if they have a voucher for Trebia/Ozempic for pt until the process her PAP but was on hold for 15 minutes with no answer.   [id number]- I called [employer] again and was on hold again forever. This call has been unsuccessful .  Medications: Outpatient Encounter Medications as of 10/06/2021  Medication Sig Note   albuterol (VENTOLIN HFA) 108 (90 Base) MCG/ACT inhaler Inhale 2 puffs into the lungs every 6 (six) hours as needed for wheezing or shortness of breath.    cefdinir (OMNICEF) 300 MG capsule Take 1 capsule (300 mg total) by mouth 2 (two) times daily.    Continuous Blood Gluc Receiver ([place]) DEVI 1 each by Does not apply route in the morning, at noon, and at bedtime.    Continuous Blood Gluc Sensor (DEXCOM G6 SENSOR) MISC Check sugars qac and qhs.    Continuous Blood Gluc Transmit (DEXCOM G6 TRANSMITTER) MISC 1 each by Does not apply route every 3 (three) months.    dapagliflozin propanediol (FARXIGA) 10 MG TABS tablet Take 1 tablet (10 mg total) by mouth daily before breakfast.    estradiol (ESTRACE VAGINAL) 0.1 MG/GM vaginal cream Apply pea-size amount to vaginal nightly for two weeks, then twice weekly    glucose blood (RELION GLUCOSE TEST STRIPS) test strip Use as instructed    ibuprofen (ADVIL) 800 MG tablet Take 1 tablet (800 mg total) by mouth every 8 (eight) hours as needed.    insulin degludec (TRESIBA FLEXTOUCH) 200 UNIT/ML FlexTouch Pen Inject 30 Units into the skin daily. (Patient taking differently: Inject 20 Units into the skin daily.) 05/23/2021: Per Dr. Alyse Low recommendation patient is to decrease to 20 units daily when beginning Ozempic 2 mg weekly   meloxicam (MOBIC) 15 MG tablet TAKE ONE TABLET BY MOUTH every morning    metFORMIN (GLUCOPHAGE) 1000 MG tablet TAKE ONE TABLET BY  MOUTH TWICE DAILY with A meal    Multiple Vitamin (MULTIVITAMIN) tablet Take 1 tablet by mouth daily.    pioglitazone (ACTOS) 30 MG tablet TAKE ONE TABLET BY MOUTH BEFORE BREAKFAST    pravastatin (PRAVACHOL) 40 MG tablet TAKE ONE TABLET BY MOUTH EVERYDAY AT BEDTIME    ramipril (ALTACE) 2.5 MG capsule TAKE ONE CAPSULE BY MOUTH EVERY MORNING    Semaglutide (OZEMPIC, 2 MG/DOSE, [place]) Inject 2 mg into the skin once a week.    spironolactone (ALDACTONE) 25 MG tablet TAKE ONE TABLET BY MOUTH BEFORE BREAKFAST    traMADol (ULTRAM) 50 MG tablet Take 1 - 2 tablets every 6 hours as needed for pain    Facility-Administered Encounter Medications as of 10/06/2021  Medication   ipratropium-albuterol (DUONEB) 0.5-2.5 (3) MG/3ML nebulizer solution 3 mL   [name], [place] Pharmacist Assistant  [phone]

## 2021-10-09 DIAGNOSIS — J208 Acute bronchitis due to other specified organisms: Secondary | ICD-10-CM | POA: Insufficient documentation

## 2021-10-09 DIAGNOSIS — R0602 Shortness of breath: Secondary | ICD-10-CM | POA: Insufficient documentation

## 2021-10-09 DIAGNOSIS — R0609 Other forms of dyspnea: Secondary | ICD-10-CM | POA: Insufficient documentation

## 2021-10-09 NOTE — Assessment & Plan Note (Signed)
1. Given rocephin and kenalog shots.  2.   Sent cefdinir and zpack for antibiotics.  3.   Recommend use albuterol inhaler 2 puffs four times a day for 2 days, then change to 2 puffs four times a day as needed.  4.  Get chest xray done.

## 2021-10-10 ENCOUNTER — Telehealth: Payer: Self-pay

## 2021-10-10 NOTE — Telephone Encounter (Signed)
Patient calling as she is unsure what she is to do about medications. She cannot afford ozempic, $500 out of pocket. Last dose Sunday, does not have anymore.   Royce Macadamia, [place] 10/10/21 2:24 PM

## 2021-10-17 NOTE — Telephone Encounter (Signed)
Patient never picked up coupon from 09/27/21. Gave patient number to [facility] [phone]) to call on status of PAP as well. She will call front desk to see if coupon still there

## 2021-10-18 ENCOUNTER — Telehealth: Payer: Self-pay

## 2021-10-18 NOTE — Telephone Encounter (Signed)
Patient came in to office stating that she was supposed to come by and pick up a Ozempic coupon since she has still not received her patient assistance yet. Patient also stated that she still has not received her Wilder Glade or her tresiba as well for patient assistance. Patient has not had any Ozempic for injection in 1 week. Per Dr. Tobie Poet patient is to restart Glimeperide at the same dose she was previously on and patient was also given sample of Ozempic 0.25mg -0.5mg  injection which patient was instructed to do 0.5mg  injection x1 weekly along with her glimeperide. Patient agreed to treatment and also spoke with [facility] pharmacist who has also instructed the patient to contact the Patient Assistance phone number that has been given to her as well, and patient claimed that she has called the number multiple times and put on hold for 2 hours at a time and never spoke with anyone. [place]

## 2021-10-18 NOTE — Telephone Encounter (Signed)
Msg from Ellard Artis, I have a patient currently here at the office and her patient assistance still has not gotten back to her yet, I know there has been paperwork issues and its had to be refaxed, but she stated that you guys mentioned a coupon for free 30days for her, she has medicare and I havent seen a coupon here so I was going to inquire what we need to do?  Per note yesterday and discussion with patient, she was to call before coming in. Spoke with [place] who said with shortage of Ozempic, there is no coupon available now. Patient needs to call PAP as well to check on status, not to wait for them to call her (Per note). Spoke with Ander Purpura about this as well

## 2021-11-08 ENCOUNTER — Other Ambulatory Visit: Payer: Self-pay

## 2021-11-08 ENCOUNTER — Telehealth: Payer: Self-pay

## 2021-11-08 MED ORDER — GLIMEPIRIDE 4 MG PO TABS: ORAL_TABLET | ORAL | 0 refills | Status: DC

## 2021-11-08 NOTE — Telephone Encounter (Signed)
Medication Refill - Medication: [place]   Patient called and stated she has not received patient assisting for her ozempic and wanted to know if we have samples.  She currently taking the 0.5 of ozempic and glimepiride 4 mg 2 tablets at breakfast until she received her 2 mg Ozempic, she is on her last shot of the 0.5 of ozempic and needs another sample but we only have 2 mg. Please advise what you would like patient to do until she receives her patient assisting.   Has the patient contacted their pharmacy? No. (Agent: If no, request that the patient contact the pharmacy for the refill. If patient does not wish to contact the pharmacy document the reason why and proceed with request.) (Agent: If yes, when and what did the pharmacy advise?)  Preferred Pharmacy (with phone number or street name): N/A Has the patient been seen for an appointment in the last year OR does the patient have an upcoming appointment? Yes.    Agent: Please be advised that RX refills may take up to 3 business days. We ask that you follow-up with your pharmacy.

## 2021-11-09 NOTE — Telephone Encounter (Signed)
Patient Called on 11/08/20, sample is in frig with her name on it.

## 2021-11-13 ENCOUNTER — Telehealth: Payer: Self-pay

## 2021-11-13 NOTE — Chronic Care Management (AMB) (Signed)
° ° °  Chronic Care Management Pharmacy Assistant   Name: Brandi Velazquez  MRN: [id number] DOB: 1961/05/25   Reason for Encounter: Patient assistance.   Medications: Outpatient Encounter Medications as of 11/13/2021  Medication Sig Note   albuterol (VENTOLIN HFA) 108 (90 Base) MCG/ACT inhaler Inhale 2 puffs into the lungs every 6 (six) hours as needed for wheezing or shortness of breath.    cefdinir (OMNICEF) 300 MG capsule Take 1 capsule (300 mg total) by mouth 2 (two) times daily.    Continuous Blood Gluc Receiver ([place]) DEVI 1 each by Does not apply route in the morning, at noon, and at bedtime.    Continuous Blood Gluc Sensor (DEXCOM G6 SENSOR) MISC Check sugars qac and qhs.    Continuous Blood Gluc Transmit (DEXCOM G6 TRANSMITTER) MISC 1 each by Does not apply route every 3 (three) months.    dapagliflozin propanediol (FARXIGA) 10 MG TABS tablet Take 1 tablet (10 mg total) by mouth daily before breakfast.    estradiol (ESTRACE VAGINAL) 0.1 MG/GM vaginal cream Apply pea-size amount to vaginal nightly for two weeks, then twice weekly    glimepiride (AMARYL) 4 MG tablet TAKE TWO TABLETS BY MOUTH BEFORE BREAKFAST    glucose blood (RELION GLUCOSE TEST STRIPS) test strip Use as instructed    ibuprofen (ADVIL) 800 MG tablet Take 1 tablet (800 mg total) by mouth every 8 (eight) hours as needed.    insulin degludec (TRESIBA FLEXTOUCH) 200 UNIT/ML FlexTouch Pen Inject 30 Units into the skin daily. (Patient taking differently: Inject 20 Units into the skin daily.) 05/23/2021: Per Dr. Alyse Low recommendation patient is to decrease to 20 units daily when beginning Ozempic 2 mg weekly   meloxicam (MOBIC) 15 MG tablet TAKE ONE TABLET BY MOUTH every morning    metFORMIN (GLUCOPHAGE) 1000 MG tablet TAKE ONE TABLET BY MOUTH TWICE DAILY with A meal    Multiple Vitamin (MULTIVITAMIN) tablet Take 1 tablet by mouth daily.    pioglitazone (ACTOS) 30 MG tablet TAKE ONE TABLET BY MOUTH BEFORE BREAKFAST     pravastatin (PRAVACHOL) 40 MG tablet TAKE ONE TABLET BY MOUTH EVERYDAY AT BEDTIME    ramipril (ALTACE) 2.5 MG capsule TAKE ONE CAPSULE BY MOUTH EVERY MORNING    Semaglutide (OZEMPIC, 2 MG/DOSE, [place]) Inject 2 mg into the skin once a week.    spironolactone (ALDACTONE) 25 MG tablet TAKE ONE TABLET BY MOUTH BEFORE BREAKFAST    traMADol (ULTRAM) 50 MG tablet Take 1 - 2 tablets every 6 hours as needed for pain    Facility-Administered Encounter Medications as of 11/13/2021  Medication   ipratropium-albuterol (DUONEB) 0.5-2.5 (3) MG/3ML nebulizer solution 3 mL   11-13-2021: Informed patient that income verification is needed to process patient assistance applications. Patient stated she will drop information off once she files her taxes.  [place] Pharmacist Assistant [phone]

## 2021-11-21 NOTE — Progress Notes (Unsigned)
Subjective:  Patient ID: Brandi Velazquez, female    DOB: 09-11-1961  Age: 61 y.o. MRN: [id number]  Chief Complaint  Patient presents with   Diabetes   Hyperlipidemia   Hypertension    HPI: Diabetes:  Complications: Glucose checking: Does not check sugars daily. Hypoglycemia: some, but not checked it before she ate something.  Happened twice in the last couple weeks. Most recent A1C: 6.8 Current medications: Metformin 1000 mg take 1 tablet BID, Pioglitizone 30 mg take 1 tablet daily, Farxiga 10 mg take 1 tablet daily, Glimepiride 4 mg take 2 tablets daily, Tresiba inject 30 units daily. Ozempic 0.5 mg once weekly. ON ramipril 2.5 mg one daily Last Eye Exam: 03/07/2021 Foot checks: Yes daily.  Hyperlipidemia: Current medications: Pravastatin 40 mg take 1 tablet daily  Chronic Peripheral Edema: Current medications: Spironolactone 25 mg take 1 tablet daily.  Diet: healthy Exercise: walks some.    Patient has been having a chronic cough for approximately 8 weeks.  She has been treated with 2 antibiotics with no improvement.  She has an albuterol inhaler which she has been using as needed but does not seem to be helping.  Chest x-ray was done and was normal.  Patient does not have a history of COPD however does have a history of smoking.  Patient also has dyspnea with exertion. Current Outpatient Medications on File Prior to Visit  Medication Sig Dispense Refill   albuterol (VENTOLIN HFA) 108 (90 Base) MCG/ACT inhaler Inhale 2 puffs into the lungs every 6 (six) hours as needed for wheezing or shortness of breath. 8 g 2   Continuous Blood Gluc Receiver (DEXCOM G6 RECEIVER) DEVI 1 each by Does not apply route in the morning, at noon, and at bedtime. 1 each 3   Continuous Blood Gluc Sensor (DEXCOM G6 SENSOR) MISC Check sugars qac and qhs. 9 each 3   Continuous Blood Gluc Transmit (DEXCOM G6 TRANSMITTER) MISC 1 each by Does not apply route every 3 (three) months. 1 each 3   dapagliflozin  propanediol (FARXIGA) 10 MG TABS tablet Take 1 tablet (10 mg total) by mouth daily before breakfast. 90 tablet 3   estradiol (ESTRACE VAGINAL) 0.1 MG/GM vaginal cream Apply pea-size amount to vaginal nightly for two weeks, then twice weekly 42.5 g 12   glimepiride (AMARYL) 4 MG tablet TAKE TWO TABLETS BY MOUTH BEFORE BREAKFAST 90 tablet 0   glucose blood (RELION GLUCOSE TEST STRIPS) test strip Use as instructed 100 each 2   ibuprofen (ADVIL) 800 MG tablet Take 1 tablet (800 mg total) by mouth every 8 (eight) hours as needed. 30 tablet 0   insulin degludec (TRESIBA FLEXTOUCH) 200 UNIT/ML FlexTouch Pen Inject 30 Units into the skin daily. (Patient taking differently: Inject 20 Units into the skin daily.) 15 mL 0   meloxicam (MOBIC) 15 MG tablet TAKE ONE TABLET BY MOUTH every morning 30 tablet 1   metFORMIN (GLUCOPHAGE) 1000 MG tablet TAKE ONE TABLET BY MOUTH TWICE DAILY with A meal 180 tablet 1   Multiple Vitamin (MULTIVITAMIN) tablet Take 1 tablet by mouth daily.     pioglitazone (ACTOS) 30 MG tablet TAKE ONE TABLET BY MOUTH BEFORE BREAKFAST 90 tablet 1   pravastatin (PRAVACHOL) 40 MG tablet TAKE ONE TABLET BY MOUTH EVERYDAY AT BEDTIME 90 tablet 1   ramipril (ALTACE) 2.5 MG capsule TAKE ONE CAPSULE BY MOUTH EVERY MORNING 90 capsule 1   Semaglutide (OZEMPIC, 2 MG/DOSE, [place]) Inject 2 mg into the skin once a week.  spironolactone (ALDACTONE) 25 MG tablet TAKE ONE TABLET BY MOUTH BEFORE BREAKFAST 90 tablet 1   traMADol (ULTRAM) 50 MG tablet Take 1 - 2 tablets every 6 hours as needed for pain     No current facility-administered medications on file prior to visit.   Past Medical History:  Diagnosis Date   Ankle pain    Chronic pain    back   COPD (chronic obstructive pulmonary disease) (HCC)    Depression    Diabetes mellitus    GERD (gastroesophageal reflux disease)    Hyperlipidemia    Hypertension    Low back pain    Obstructive sleep apnea    Pneumonia 2010   Post-menopausal atrophic  vaginitis    Superficial phlebitis    Past Surgical History:  Procedure Laterality Date   BACK SURGERY  1990   CESAREAN SECTION     CYST REMOVAL HAND     inner thigh   SPINE SURGERY     lumbar laminectomy and diskectomy    Family History  Problem Relation Age of Onset   Diabetes Mother    Heart disease Mother    Cancer Father    Stroke Sister    Social History   Socioeconomic History   Marital status: Single    Spouse name: Not on file   Number of children: Not on file   Years of education: Not on file   Highest education level: Not on file  Occupational History   Not on file  Tobacco Use   Smoking status: Former    Types: Cigarettes    Quit date: 03/20/2009    Years since quitting: 12.6   Smokeless tobacco: Never  Vaping Use   Vaping Use: Never used  Substance and Sexual Activity   Alcohol use: No   Drug use: No   Sexual activity: Not on file  Other Topics Concern   Not on file  Social History Narrative   Not on file   Social Determinants of Health   Financial Resource Strain: Not on file  Food Insecurity: Not on file  Transportation Needs: Not on file  Physical Activity: Not on file  Stress: Not on file  Social Connections: Not on file    Review of Systems  Constitutional:  Negative for appetite change, fatigue and fever.  HENT:  Positive for postnasal drip. Negative for congestion, ear pain, sinus pressure and sore throat.   Respiratory:  Positive for cough. Negative for chest tightness, shortness of breath and wheezing.   Cardiovascular:  Negative for chest pain and palpitations.  Gastrointestinal:  Negative for abdominal pain, constipation, diarrhea, nausea and vomiting.  Genitourinary:  Negative for dysuria and hematuria.  Musculoskeletal:  Negative for arthralgias, back pain, joint swelling and myalgias.  Skin:  Negative for rash.  Neurological:  Negative for dizziness, weakness and headaches.  Psychiatric/Behavioral:  Negative for dysphoric  mood. The patient is not nervous/anxious.     Objective:  BP 132/74 (BP Location: Left Arm, Patient Position: Sitting)    Pulse 81    Temp 97.6 F (36.4 C) (Temporal)    Ht '5\' 3"'$  (1.6 m)    Wt 222 lb (100.7 kg)    SpO2 98%    BMI 39.33 kg/m   BP/Weight 11/22/2021 09/27/2021 63/04/9372  Systolic BP 428 768 115  Diastolic BP 74 60 70  Wt. (Lbs) 222 223 231  BMI 39.33 39.5 40.92    Physical Exam Vitals reviewed.  Constitutional:      Appearance:  Normal appearance. She is normal weight.  HENT:     Nose: Nose normal.  Neck:     Vascular: No carotid bruit.  Cardiovascular:     Rate and Rhythm: Normal rate and regular rhythm.     Heart sounds: Normal heart sounds.  Pulmonary:     Effort: Pulmonary effort is normal. No respiratory distress.     Breath sounds: Wheezing and rhonchi present.  Abdominal:     General: Abdomen is flat. Bowel sounds are normal.     Palpations: Abdomen is soft.     Tenderness: There is no abdominal tenderness.  Neurological:     Mental Status: She is alert and oriented to person, place, and time.  Psychiatric:        Mood and Affect: Mood normal.        Behavior: Behavior normal.    Diabetic Foot Exam - Simple   Simple Foot Form Diabetic Foot exam was performed with the following findings: Yes 11/22/2021 10:59 AM  Visual Inspection No deformities, no ulcerations, no other skin breakdown bilaterally: Yes Sensation Testing Intact to touch and monofilament testing bilaterally: Yes Pulse Check Posterior Tibialis and Dorsalis pulse intact bilaterally: Yes Comments      Lab Results  Component Value Date   WBC 9.7 08/22/2021   HGB 14.9 08/22/2021   HCT 45.1 08/22/2021   PLT 218 08/22/2021   GLUCOSE 100 (H) 08/22/2021   CHOL 139 08/22/2021   TRIG 70 08/22/2021   HDL 53 08/22/2021   LDLCALC 72 08/22/2021   ALT 15 08/22/2021   AST 12 08/22/2021   NA 142 08/22/2021   K 4.8 08/22/2021   CL 107 (H) 08/22/2021   CREATININE 0.67 08/22/2021   BUN 18  08/22/2021   CO2 26 08/22/2021   HGBA1C 6.8 (H) 08/22/2021   MICROALBUR 30 05/18/2021      Assessment & Plan:   Problem List Items Addressed This Visit       Cardiovascular and Mediastinum   Hypertension associated with diabetes ([place])   Relevant Orders   CBC With Diff/Platelet   Comprehensive metabolic panel   TSH     Endocrine   Diabetic polyneuropathy associated with type 2 diabetes mellitus (HCC)   Relevant Orders   Hemoglobin A1c   Microalbumin / creatinine urine ratio     Other   Chronic midline low back pain without sciatica   Hyperlipidemia - Primary   Relevant Orders   Lipid panel   Cough   Relevant Orders   CT Chest Wo Contrast   Peak flow (Completed)   Other Visit Diagnoses     Acute bronchitis with wheezing       Relevant Orders   Peak flow (Completed)     .  No orders of the defined types were placed in this encounter.   Orders Placed This Encounter  Procedures   CT Chest Wo Contrast   CBC With Diff/Platelet   Comprehensive metabolic panel   Lipid panel   Hemoglobin A1c   Microalbumin / creatinine urine ratio   TSH   Peak flow     Follow-up: Return in about 3 months (around 02/22/2022) for chronic fasting, awv overdue. schedule with Maudie Mercury please .  An After Visit Summary was printed and given to the patient.    I,Lauren M Auman,acting as a scribe for [name], MD.,have documented all relevant documentation on the behalf of [name], MD,as directed by  [name], MD while in the presence of Bejamin Hackbart  Raylyn Speckman, MD.    [name], MD [place] [phone]

## 2021-11-22 ENCOUNTER — Other Ambulatory Visit: Payer: Self-pay

## 2021-11-22 ENCOUNTER — Encounter: Payer: Self-pay | Admitting: Family Medicine

## 2021-11-22 ENCOUNTER — Ambulatory Visit (INDEPENDENT_AMBULATORY_CARE_PROVIDER_SITE_OTHER): Payer: Medicare HMO | Admitting: Family Medicine

## 2021-11-22 VITALS — BP 132/74 | HR 81 | Temp 97.6°F | Ht 63.0 in | Wt 222.0 lb

## 2021-11-22 DIAGNOSIS — E1142 Type 2 diabetes mellitus with diabetic polyneuropathy: Secondary | ICD-10-CM

## 2021-11-22 DIAGNOSIS — E1159 Type 2 diabetes mellitus with other circulatory complications: Secondary | ICD-10-CM

## 2021-11-22 DIAGNOSIS — M545 Low back pain, unspecified: Secondary | ICD-10-CM

## 2021-11-22 DIAGNOSIS — E782 Mixed hyperlipidemia: Secondary | ICD-10-CM

## 2021-11-22 DIAGNOSIS — R0989 Other specified symptoms and signs involving the circulatory and respiratory systems: Secondary | ICD-10-CM | POA: Diagnosis not present

## 2021-11-22 DIAGNOSIS — G8929 Other chronic pain: Secondary | ICD-10-CM

## 2021-11-22 DIAGNOSIS — R053 Chronic cough: Secondary | ICD-10-CM

## 2021-11-22 DIAGNOSIS — R0609 Other forms of dyspnea: Secondary | ICD-10-CM | POA: Diagnosis not present

## 2021-11-22 DIAGNOSIS — I152 Hypertension secondary to endocrine disorders: Secondary | ICD-10-CM

## 2021-11-22 DIAGNOSIS — R062 Wheezing: Secondary | ICD-10-CM | POA: Diagnosis not present

## 2021-11-22 DIAGNOSIS — K219 Gastro-esophageal reflux disease without esophagitis: Secondary | ICD-10-CM

## 2021-11-22 DIAGNOSIS — Z6839 Body mass index (BMI) 39.0-39.9, adult: Secondary | ICD-10-CM

## 2021-11-22 NOTE — Assessment & Plan Note (Signed)
Chronic cough for more than 8 weeks.  Failed conservative treatment.  Ordered chest CT scan ?

## 2021-11-22 NOTE — Assessment & Plan Note (Signed)
Start on [place] 2 puffs twice daily. ?DuoNeb given.  Wheezing did not improve.  Peak flows worsened. ?

## 2021-11-22 NOTE — Assessment & Plan Note (Signed)
Well controlled.  ?No changes to medicines. Continue Pravastatin 40 mg take 1 tablet daily ?Continue to work on eating a healthy diet and exercise.  ?Labs drawn today.  ? ?

## 2021-11-22 NOTE — Assessment & Plan Note (Signed)
Start on [place] 2 puffs twice daily ?

## 2021-11-22 NOTE — Assessment & Plan Note (Signed)
The current medical regimen is effective;  continue present plan and medications. ?Continue spironolactone and ramipril ?

## 2021-11-22 NOTE — Assessment & Plan Note (Signed)
Control: good ?Recommend check sugars fasting daily. ?Recommend check feet daily. ?Recommend annual eye exams. ?Medicines: Change glimepiride to 4 mg one before breakfast and one before either lunch or supper. Metformin 1000 mg take 1 tablet BID, Pioglitizone 30 mg take 1 tablet daily, Farxiga 10 mg take 1 tablet daily, Tresiba inject 30 units daily. Ozempic 0.5 mg once weekly. ON ramipril 2.5 mg one dailyContinue to work on eating a healthy diet and exercise.  ?Labs drawn today.   ? ?

## 2021-11-22 NOTE — Patient Instructions (Addendum)
Change glimepiride to 4 mg one before breakfast and one before either lunch or supper.  ? ?Start breztri 2 puffs twice daily.  ?Ordered ct of chest  ?

## 2021-11-22 NOTE — Assessment & Plan Note (Signed)
CT of chest ordered.

## 2021-11-23 ENCOUNTER — Ambulatory Visit (HOSPITAL_BASED_OUTPATIENT_CLINIC_OR_DEPARTMENT_OTHER)
Admission: RE | Admit: 2021-11-23 | Discharge: 2021-11-23 | Disposition: A | Discharge status: Home / Self Care | Payer: Medicare HMO | Source: Ambulatory Visit | Attending: Family Medicine | Admitting: Family Medicine

## 2021-11-23 DIAGNOSIS — R053 Chronic cough: Secondary | ICD-10-CM

## 2021-11-23 DIAGNOSIS — J439 Emphysema, unspecified: Secondary | ICD-10-CM | POA: Diagnosis not present

## 2021-11-23 DIAGNOSIS — I700 Atherosclerosis of aorta: Secondary | ICD-10-CM | POA: Diagnosis not present

## 2021-11-23 LAB — COMPREHENSIVE METABOLIC PANEL
ALT: 20 IU/L (ref 0–32)
AST: 13 IU/L (ref 0–40)
Albumin/Globulin Ratio: 1.9 (ref 1.2–2.2)
Albumin: 4.6 g/dL (ref 3.8–4.8)
Alkaline Phosphatase: 55 IU/L (ref 44–121)
BUN/Creatinine Ratio: 39 — ABNORMAL HIGH (ref 12–28)
BUN: 25 mg/dL (ref 8–27)
Bilirubin Total: 0.4 mg/dL (ref 0.0–1.2)
CO2: 24 mmol/L (ref 20–29)
Calcium: 9.9 mg/dL (ref 8.7–10.3)
Chloride: 105 mmol/L (ref 96–106)
Creatinine, Ser: 0.64 mg/dL (ref 0.57–1.00)
Globulin, Total: 2.4 g/dL (ref 1.5–4.5)
Glucose: 79 mg/dL (ref 70–99)
Potassium: 4.9 mmol/L (ref 3.5–5.2)
Sodium: 143 mmol/L (ref 134–144)
Total Protein: 7 g/dL (ref 6.0–8.5)
eGFR: 100 mL/min/{1.73_m2} (ref >59)

## 2021-11-23 LAB — CARDIOVASCULAR RISK ASSESSMENT

## 2021-11-23 LAB — MICROALBUMIN / CREATININE URINE RATIO
Creatinine, Urine: 95.5 mg/dL
Microalb/Creat Ratio: 14 mg/g creat (ref 0–29)
Microalbumin, Urine: 13.5 ug/mL

## 2021-11-23 LAB — LIPID PANEL
Chol/HDL Ratio: 2.4 ratio (ref 0.0–4.4)
Cholesterol, Total: 140 mg/dL (ref 100–199)
HDL: 58 mg/dL (ref >39)
LDL Chol Calc (NIH): 70 mg/dL (ref 0–99)
Triglycerides: 56 mg/dL (ref 0–149)
VLDL Cholesterol Cal: 12 mg/dL (ref 5–40)

## 2021-11-23 LAB — TSH: TSH: 1.5 u[IU]/mL (ref 0.450–4.500)

## 2021-11-23 LAB — CBC WITH DIFF/PLATELET
Basophils Absolute: 0 10*3/uL (ref 0.0–0.2)
Basos: 0 %
EOS (ABSOLUTE): 0.3 10*3/uL (ref 0.0–0.4)
Eos: 2 %
Hematocrit: 45.7 % (ref 34.0–46.6)
Hemoglobin: 15.3 g/dL (ref 11.1–15.9)
Immature Grans (Abs): 0 10*3/uL (ref 0.0–0.1)
Immature Granulocytes: 0 %
Lymphocytes Absolute: 3.6 10*3/uL — ABNORMAL HIGH (ref 0.7–3.1)
Lymphs: 31 %
MCH: 29.3 pg (ref 26.6–33.0)
MCHC: 33.5 g/dL (ref 31.5–35.7)
MCV: 88 fL (ref 79–97)
Monocytes Absolute: 0.8 10*3/uL (ref 0.1–0.9)
Monocytes: 7 %
Neutrophils Absolute: 6.7 10*3/uL (ref 1.4–7.0)
Neutrophils: 60 %
Platelets: 252 10*3/uL (ref 150–450)
RBC: 5.22 x10E6/uL (ref 3.77–5.28)
RDW: 14 % (ref 11.7–15.4)
WBC: 11.5 10*3/uL — ABNORMAL HIGH (ref 3.4–10.8)

## 2021-11-23 LAB — HEMOGLOBIN A1C
Est. average glucose Bld gHb Est-mCnc: 146 mg/dL
Hgb A1c MFr Bld: 6.7 % — ABNORMAL HIGH (ref 4.8–5.6)

## 2021-11-29 ENCOUNTER — Telehealth: Payer: Medicare HMO

## 2021-12-03 NOTE — Assessment & Plan Note (Signed)
The current medical regimen is effective;  continue present plan and medications.  

## 2021-12-03 NOTE — Assessment & Plan Note (Signed)
Recommend continue to work on eating healthy diet and exercise.  

## 2021-12-06 ENCOUNTER — Encounter: Payer: Self-pay | Admitting: Nurse Practitioner

## 2021-12-06 ENCOUNTER — Ambulatory Visit (INDEPENDENT_AMBULATORY_CARE_PROVIDER_SITE_OTHER): Payer: Medicare HMO | Admitting: Nurse Practitioner

## 2021-12-06 VITALS — BP 114/62 | HR 84 | Temp 97.2°F | Ht 64.0 in | Wt 222.0 lb

## 2021-12-06 DIAGNOSIS — J302 Other seasonal allergic rhinitis: Secondary | ICD-10-CM | POA: Diagnosis not present

## 2021-12-06 DIAGNOSIS — J018 Other acute sinusitis: Secondary | ICD-10-CM | POA: Diagnosis not present

## 2021-12-06 DIAGNOSIS — R053 Chronic cough: Secondary | ICD-10-CM

## 2021-12-06 DIAGNOSIS — J441 Chronic obstructive pulmonary disease with (acute) exacerbation: Secondary | ICD-10-CM

## 2021-12-06 MED ORDER — FLUTICASONE PROPIONATE 50 MCG/ACT NA SUSP: 2.0000 | Freq: Every day | NASAL | 6 refills | Status: DC

## 2021-12-06 MED ORDER — AMOXICILLIN-POT CLAVULANATE 875-125 MG PO TABS: 1.0000 | ORAL_TABLET | Freq: Two times a day (BID) | ORAL | 0 refills | Status: DC

## 2021-12-06 MED ORDER — BENZONATATE 100 MG PO CAPS: 100.0000 mg | ORAL_CAPSULE | Freq: Two times a day (BID) | ORAL | 0 refills | Status: DC | PRN

## 2021-12-06 MED ORDER — BREZTRI AEROSPHERE 160-9-4.8 MCG/ACT IN AERO: 2.0000 | INHALATION_SPRAY | Freq: Two times a day (BID) | RESPIRATORY_TRACT | 11 refills | Status: DC

## 2021-12-06 NOTE — Progress Notes (Signed)
? ?Acute Office Visit ? ?Subjective:  ? ? Patient ID: Brandi Velazquez, female    DOB: 1961/01/01, 60 y.o.   MRN: [id number] ? ?Chief Complaint  ?Patient presents with  ? Cough  ? ? ?HPI: ?Patient is in today for Upper respiratory symptoms ?She complains of Onset of symptoms was  3 months ago and staying constant.. Treatment has included Mucinex, Breztri, and Albuterol inhalers. She ws prescribed a course of Omnicef and a Z-pack, received Rocephin 1 gm and Kenalog 80 mg injections on 09/27/21. States symptoms improved briefly but have resumed. She has a past history of COPD, former smoker, and Type 2 diabetes mellitus.  ? ?Past Medical History:  ?Diagnosis Date  ? Ankle pain   ? Chronic pain   ? back  ? COPD (chronic obstructive pulmonary disease) ([place])   ? Depression   ? Diabetes mellitus   ? GERD (gastroesophageal reflux disease)   ? Hyperlipidemia   ? Hypertension   ? Low back pain   ? Obstructive sleep apnea   ? Pneumonia 2010  ? Post-menopausal atrophic vaginitis   ? Superficial phlebitis   ? ? ?Past Surgical History:  ?Procedure Laterality Date  ? BACK SURGERY  1990  ? CESAREAN SECTION    ? CYST REMOVAL HAND    ? inner thigh  ? SPINE SURGERY    ? lumbar laminectomy and diskectomy  ? ? ?Family History  ?Problem Relation Age of Onset  ? Diabetes Mother   ? Heart disease Mother   ? Cancer Father   ? Stroke Sister   ? ? ?Social History  ? ?Socioeconomic History  ? Marital status: Single  ?  Spouse name: Not on file  ? Number of children: Not on file  ? Years of education: Not on file  ? Highest education level: Not on file  ?Occupational History  ? Not on file  ?Tobacco Use  ? Smoking status: Former  ?  Types: Cigarettes  ?  Quit date: 03/20/2009  ?  Years since quitting: 12.7  ? Smokeless tobacco: Never  ?Vaping Use  ? Vaping Use: Never used  ?Substance and Sexual Activity  ? Alcohol use: No  ? Drug use: No  ? Sexual activity: Not on file  ?Other Topics Concern  ? Not on file  ?Social History Narrative  ? Not on file   ? ?Social Determinants of Health  ? ?Financial Resource Strain: Not on file  ?Food Insecurity: Not on file  ?Transportation Needs: Not on file  ?Physical Activity: Not on file  ?Stress: Not on file  ?Social Connections: Not on file  ?Intimate Partner Violence: Not on file  ? ? ?Outpatient Medications Prior to Visit  ?Medication Sig Dispense Refill  ? albuterol (VENTOLIN HFA) 108 (90 Base) MCG/ACT inhaler Inhale 2 puffs into the lungs every 6 (six) hours as needed for wheezing or shortness of breath. 8 g 2  ? Continuous Blood Gluc Receiver ([place]) DEVI 1 each by Does not apply route in the morning, at noon, and at bedtime. 1 each 3  ? Continuous Blood Gluc Sensor (DEXCOM G6 SENSOR) MISC Check sugars qac and qhs. 9 each 3  ? Continuous Blood Gluc Transmit (DEXCOM G6 TRANSMITTER) MISC 1 each by Does not apply route every 3 (three) months. 1 each 3  ? dapagliflozin propanediol (FARXIGA) 10 MG TABS tablet Take 1 tablet (10 mg total) by mouth daily before breakfast. 90 tablet 3  ? estradiol (ESTRACE VAGINAL) 0.1 MG/GM vaginal  cream Apply pea-size amount to vaginal nightly for two weeks, then twice weekly 42.5 g 12  ? glimepiride (AMARYL) 4 MG tablet TAKE TWO TABLETS BY MOUTH BEFORE BREAKFAST 90 tablet 0  ? glucose blood (RELION GLUCOSE TEST STRIPS) test strip Use as instructed 100 each 2  ? ibuprofen (ADVIL) 800 MG tablet Take 1 tablet (800 mg total) by mouth every 8 (eight) hours as needed. 30 tablet 0  ? insulin degludec (TRESIBA FLEXTOUCH) 200 UNIT/ML FlexTouch Pen Inject 30 Units into the skin daily. (Patient taking differently: Inject 20 Units into the skin daily.) 15 mL 0  ? meloxicam (MOBIC) 15 MG tablet TAKE ONE TABLET BY MOUTH every morning 30 tablet 1  ? metFORMIN (GLUCOPHAGE) 1000 MG tablet TAKE ONE TABLET BY MOUTH TWICE DAILY with A meal 180 tablet 1  ? Multiple Vitamin (MULTIVITAMIN) tablet Take 1 tablet by mouth daily.    ? pioglitazone (ACTOS) 30 MG tablet TAKE ONE TABLET BY MOUTH BEFORE  BREAKFAST 90 tablet 1  ? pravastatin (PRAVACHOL) 40 MG tablet TAKE ONE TABLET BY MOUTH EVERYDAY AT BEDTIME 90 tablet 1  ? ramipril (ALTACE) 2.5 MG capsule TAKE ONE CAPSULE BY MOUTH EVERY MORNING 90 capsule 1  ? Semaglutide (OZEMPIC, 2 MG/DOSE, [place]) Inject 2 mg into the skin once a week.    ? spironolactone (ALDACTONE) 25 MG tablet TAKE ONE TABLET BY MOUTH BEFORE BREAKFAST 90 tablet 1  ? traMADol (ULTRAM) 50 MG tablet Take 1 - 2 tablets every 6 hours as needed for pain    ? ?No facility-administered medications prior to visit.  ? ? ?Allergies  ?Allergen Reactions  ? Bactrim [Sulfamethoxazole-Trimethoprim] Swelling  ? Morphine And Related   ? ? ?Review of Systems  ?Constitutional:  Negative for chills, fatigue and fever.  ?HENT:  Negative for congestion, ear pain, postnasal drip, rhinorrhea, sinus pressure, sinus pain and sore throat.   ?Respiratory:  Positive for cough and shortness of breath.   ?Cardiovascular:  Negative for chest pain.  ?Gastrointestinal:  Negative for diarrhea and nausea.  ?Neurological:  Negative for dizziness and headaches.  ? ?   ?Objective:  ?  ?Physical Exam ?Vitals reviewed.  ?Constitutional:   ?   Appearance: She is obese. She is ill-appearing.  ?HENT:  ?   Right Ear: No tenderness. Tympanic membrane is erythematous.  ?   Left Ear: No tenderness. Tympanic membrane is erythematous.  ?   Nose: Congestion and rhinorrhea present.  ?   Mouth/Throat:  ?   Pharynx: Posterior oropharyngeal erythema present.  ?Cardiovascular:  ?   Rate and Rhythm: Normal rate.  ?   Pulses: Normal pulses.  ?   Heart sounds: Normal heart sounds.  ?Pulmonary:  ?   Effort: Pulmonary effort is normal.  ?   Breath sounds: Wheezing present.  ?Skin: ?   General: Skin is warm and dry.  ?   Capillary Refill: Capillary refill takes less than 2 seconds.  ?Neurological:  ?   General: No focal deficit present.  ?   Mental Status: She is alert and oriented to person, place, and time.  ? ? ?BP 114/62   Pulse 84   Temp (!) 97.2 ?F  (36.2 ?C)   Ht 5' 4"  (1.626 m)   Wt 222 lb (100.7 kg)   SpO2 94%   BMI 38.11 kg/m?   ?Wt Readings from Last 3 Encounters:  ?12/06/21 222 lb (100.7 kg)  ?11/22/21 222 lb (100.7 kg)  ?09/27/21 223 lb (101.2 kg)  ? ? ?  Health Maintenance Due  ?Topic Date Due  ? DEXA SCAN  Never done  ? HIV Screening  Never done  ? Zoster Vaccines- Shingrix (1 of 2) Never done  ? COVID-19 Vaccine (3 - Moderna risk series) 08/22/2021  ? ? ? ? ? ?Lab Results  ?Component Value Date  ? TSH 1.500 11/22/2021  ? ?Lab Results  ?Component Value Date  ? WBC 11.5 (H) 11/22/2021  ? HGB 15.3 11/22/2021  ? HCT 45.7 11/22/2021  ? MCV 88 11/22/2021  ? PLT 252 11/22/2021  ? ?Lab Results  ?Component Value Date  ? NA 143 11/22/2021  ? K 4.9 11/22/2021  ? CO2 24 11/22/2021  ? GLUCOSE 79 11/22/2021  ? BUN 25 11/22/2021  ? CREATININE 0.64 11/22/2021  ? BILITOT 0.4 11/22/2021  ? ALKPHOS 55 11/22/2021  ? AST 13 11/22/2021  ? ALT 20 11/22/2021  ? PROT 7.0 11/22/2021  ? ALBUMIN 4.6 11/22/2021  ? CALCIUM 9.9 11/22/2021  ? EGFR 100 11/22/2021  ? ?Lab Results  ?Component Value Date  ? CHOL 140 11/22/2021  ? ?Lab Results  ?Component Value Date  ? HDL 58 11/22/2021  ? ?Lab Results  ?Component Value Date  ? [place] 70 11/22/2021  ? ?Lab Results  ?Component Value Date  ? TRIG 56 11/22/2021  ? ?Lab Results  ?Component Value Date  ? CHOLHDL 2.4 11/22/2021  ? ?Lab Results  ?Component Value Date  ? HGBA1C 6.7 (H) 11/22/2021  ? ? ?   ?Assessment & Plan:  ? ?1. Acute non-recurrent sinusitis of other sinus ?- amoxicillin-clavulanate (AUGMENTIN) 875-125 MG tablet; Take 1 tablet by mouth 2 (two) times daily.  Dispense: 20 tablet; Refill: 0 ? ?2. Seasonal allergic rhinitis, unspecified trigger ?- fluticasone (FLONASE) 50 MCG/ACT nasal spray; Place 2 sprays into both nostrils daily.  Dispense: 16 g; Refill: 6 ? ?3. Chronic cough ?- benzonatate (TESSALON) 100 MG capsule; Take 1 capsule (100 mg total) by mouth 2 (two) times daily as needed for cough.  Dispense: 20 capsule;  Refill: 0 ? ?4. COPD exacerbation ([place]) ?- Budeson-Glycopyrrol-Formoterol (BREZTRI AEROSPHERE) 160-9-4.8 MCG/ACT AERO; Inhale 2 puffs into the lungs 2 (two) times daily.  Dispense: 10.7 g; Refill: 11 ?  ? ? ?Take Aug

## 2021-12-06 NOTE — Patient Instructions (Signed)
Take Augmentin twice daily for 10 days ?Use Flonase nasal daily  ?Rest and push fluids ?Continue Mucinex as directed ?Continue Breztri and Albuterol inhalers ?Seek Emergency medical care for severe symptoms ? ?Sinusitis, Adult ?Sinusitis is soreness and swelling (inflammation) of your sinuses. Sinuses are hollow spaces in the bones around your face. They are located: ?Around your eyes. ?In the middle of your forehead. ?Behind your nose. ?In your cheekbones. ?Your sinuses and nasal passages are lined with a fluid called mucus. Mucus drains out of your sinuses. Swelling can trap mucus in your sinuses. This lets germs (bacteria, virus, or fungus) grow, which leads to infection. Most of the time, this condition is caused by a virus. ?What are the causes? ?This condition is caused by: ?Allergies. ?Asthma. ?Germs. ?Things that block your nose or sinuses. ?Growths in the nose (nasal polyps). ?Chemicals or irritants in the air. ?Fungus (rare). ?What increases the risk? ?You are more likely to develop this condition if: ?You have a weak body defense system (immune system). ?You do a lot of swimming or diving. ?You use nasal sprays too much. ?You smoke. ?What are the signs or symptoms? ?The main symptoms of this condition are pain and a feeling of pressure around the sinuses. Other symptoms include: ?Stuffy nose (congestion). ?Runny nose (drainage). ?Swelling and warmth in the sinuses. ?Headache. ?Toothache. ?A cough that may get worse at night. ?Mucus that collects in the throat or the back of the nose (postnasal drip). ?Being unable to smell and taste. ?Being very tired (fatigue). ?A fever. ?Sore throat. ?Bad breath. ?How is this diagnosed? ?This condition is diagnosed based on: ?Your symptoms. ?Your medical history. ?A physical exam. ?Tests to find out if your condition is short-term (acute) or long-term (chronic). Your doctor may: ?Check your nose for growths (polyps). ?Check your sinuses using a tool that has a light  (endoscope). ?Check for allergies or germs. ?Do imaging tests, such as an MRI or CT scan. ?How is this treated? ?Treatment for this condition depends on the cause and whether it is short-term or long-term. ?If caused by a virus, your symptoms should go away on their own within 10 days. You may be given medicines to relieve symptoms. They include: ?Medicines that shrink swollen tissue in the nose. ?Medicines that treat allergies (antihistamines). ?A spray that treats swelling of the nostrils.  ?Rinses that help get rid of thick mucus in your nose (nasal saline washes). ?If caused by bacteria, your doctor may wait to see if you will get better without treatment. You may be given antibiotic medicine if you have: ?A very bad infection. ?A weak body defense system. ?If caused by growths in the nose, you may need to have surgery. ?Follow these instructions at home: ?Medicines ?Take, use, or apply over-the-counter and prescription medicines only as told by your doctor. These may include nasal sprays. ?If you were prescribed an antibiotic medicine, take it as told by your doctor. Do not stop taking the antibiotic even if you start to feel better. ?Hydrate and humidify ? ?Drink enough water to keep your pee (urine) pale yellow. ?Use a cool mist humidifier to keep the humidity level in your home above 50%. ?Breathe in steam for 10-15 minutes, 3-4 times a day, or as told by your doctor. You can do this in the bathroom while a hot shower is running. ?Try not to spend time in cool or dry air. ?Rest ?Rest as much as you can. ?Sleep with your head raised (elevated). ?Make sure you  get enough sleep each night. ?General instructions ? ?Put a warm, moist washcloth on your face 3-4 times a day, or as often as told by your doctor. This will help with discomfort. ?Wash your hands often with soap and water. If there is no soap and water, use hand sanitizer. ?Do not smoke. Avoid being around people who are smoking (secondhand  smoke). ?Keep all follow-up visits as told by your doctor. This is important. ?Contact a doctor if: ?You have a fever. ?Your symptoms get worse. ?Your symptoms do not get better within 10 days. ?Get help right away if: ?You have a very bad headache. ?You cannot stop throwing up (vomiting). ?You have very bad pain or swelling around your face or eyes. ?You have trouble seeing. ?You feel confused. ?Your neck is stiff. ?You have trouble breathing. ?Summary ?Sinusitis is swelling of your sinuses. Sinuses are hollow spaces in the bones around your face. ?This condition is caused by tissues in your nose that become inflamed or swollen. This traps germs. These can lead to infection. ?If you were prescribed an antibiotic medicine, take it as told by your doctor. Do not stop taking it even if you start to feel better. ?Keep all follow-up visits as told by your doctor. This is important. ?This information is not intended to replace advice given to you by your health care provider. Make sure you discuss any questions you have with your health care provider. ?Document Revised: 02/03/2018 Document Reviewed: 02/03/2018 ?Elsevier Patient Education ? [place]. ? ?

## 2021-12-07 ENCOUNTER — Ambulatory Visit: Payer: Medicare HMO

## 2021-12-12 ENCOUNTER — Other Ambulatory Visit: Payer: Self-pay | Admitting: Podiatry

## 2021-12-12 ENCOUNTER — Telehealth: Payer: Self-pay

## 2021-12-12 NOTE — Telephone Encounter (Signed)
Brandi Velazquez called for samples of ozempic.  We currently do not have any samples.  Dr. Tobie Poet approved for her to begin Rybelsus 3 mg daily until ozempic is available.  She is going to pick-up samples of rybelsus 3 mg (30 day supply).   ?

## 2021-12-21 ENCOUNTER — Ambulatory Visit (INDEPENDENT_AMBULATORY_CARE_PROVIDER_SITE_OTHER): Payer: Medicare HMO | Admitting: Nurse Practitioner

## 2021-12-21 ENCOUNTER — Encounter: Payer: Self-pay | Admitting: Nurse Practitioner

## 2021-12-21 ENCOUNTER — Other Ambulatory Visit: Payer: Self-pay

## 2021-12-21 ENCOUNTER — Telehealth: Payer: Self-pay

## 2021-12-21 VITALS — BP 112/64 | HR 85 | Temp 97.2°F | Ht 64.0 in | Wt 223.0 lb

## 2021-12-21 DIAGNOSIS — J441 Chronic obstructive pulmonary disease with (acute) exacerbation: Secondary | ICD-10-CM | POA: Diagnosis not present

## 2021-12-21 DIAGNOSIS — J302 Other seasonal allergic rhinitis: Secondary | ICD-10-CM

## 2021-12-21 DIAGNOSIS — J438 Other emphysema: Secondary | ICD-10-CM

## 2021-12-21 MED ORDER — IPRATROPIUM-ALBUTEROL 0.5-2.5 (3) MG/3ML IN SOLN: 3.0000 mL | Freq: Once | RESPIRATORY_TRACT | Status: DC

## 2021-12-21 MED ORDER — DOXYCYCLINE HYCLATE 100 MG PO TABS: 100.0000 mg | ORAL_TABLET | Freq: Two times a day (BID) | ORAL | 0 refills | Status: DC

## 2021-12-21 MED ORDER — PIOGLITAZONE HCL 30 MG PO TABS: 30.0000 mg | ORAL_TABLET | Freq: Every day | ORAL | 1 refills | Status: DC

## 2021-12-21 MED ORDER — IPRATROPIUM-ALBUTEROL 0.5-2.5 (3) MG/3ML IN SOLN: 3.0000 mL | Freq: Four times a day (QID) | RESPIRATORY_TRACT | 6 refills | Status: DC | PRN

## 2021-12-21 NOTE — Progress Notes (Signed)
? ?Acute Office Visit ? ?Subjective:  ? ? Patient ID: Brandi Velazquez, female    DOB: [birth year], 61 y.o.   MRN: [id number] ? ?Chief Complaint  ?Patient presents with  ? URI  ? ? ?HPI: ?Patient is in today for Upper respiratory symptoms ?She complains of congestion, nasal congestion, and productive cough. Onset of symptoms was  4 months ago and staying constant. Treatment has included Mucinex, Omnicef, Z-pack, Rocephin and Kenalog 80, Augmentin, and Tessalon pearls.  Past history is significant for Emphysema, bronchitis and pneumonia. Currently using Breztri and Albuterol inhalers, Flonase nasal spray.Patient is former smoker, quit 15 years ago.  ? ?Past Medical History:  ?Diagnosis Date  ? Ankle pain   ? Chronic pain   ? back  ? COPD (chronic obstructive pulmonary disease) ([place])   ? Depression   ? Diabetes mellitus   ? GERD (gastroesophageal reflux disease)   ? Hyperlipidemia   ? Hypertension   ? Low back pain   ? Obstructive sleep apnea   ? Pneumonia 2010  ? Post-menopausal atrophic vaginitis   ? Superficial phlebitis   ? ? ?Past Surgical History:  ?Procedure Laterality Date  ? BACK SURGERY  1990  ? CESAREAN SECTION    ? CYST REMOVAL HAND    ? inner thigh  ? SPINE SURGERY    ? lumbar laminectomy and diskectomy  ? ? ?Family History  ?Problem Relation Age of Onset  ? Diabetes Mother   ? Heart disease Mother   ? Cancer Father   ? Stroke Sister   ? ? ?Social History  ? ?Socioeconomic History  ? Marital status: Single  ?  Spouse name: Not on file  ? Number of children: Not on file  ? Years of education: Not on file  ? Highest education level: Not on file  ?Occupational History  ? Not on file  ?Tobacco Use  ? Smoking status: Former  ?  Types: Cigarettes  ?  Quit date: 03/20/2009  ?  Years since quitting: 12.7  ? Smokeless tobacco: Never  ?Vaping Use  ? Vaping Use: Never used  ?Substance and Sexual Activity  ? Alcohol use: No  ? Drug use: No  ? Sexual activity: Not on file  ?Other Topics Concern  ? Not on file  ?Social  History Narrative  ? Not on file  ? ?Social Determinants of Health  ? ?Financial Resource Strain: Not on file  ?Food Insecurity: Not on file  ?Transportation Needs: Not on file  ?Physical Activity: Not on file  ?Stress: Not on file  ?Social Connections: Not on file  ?Intimate Partner Violence: Not on file  ? ? ?Outpatient Medications Prior to Visit  ?Medication Sig Dispense Refill  ? albuterol (VENTOLIN HFA) 108 (90 Base) MCG/ACT inhaler Inhale 2 puffs into the lungs every 6 (six) hours as needed for wheezing or shortness of breath. 8 g 2  ? aspirin 81 MG chewable tablet Chew by mouth daily.    ? Budeson-Glycopyrrol-Formoterol (BREZTRI AEROSPHERE) 160-9-4.8 MCG/ACT AERO Inhale 2 puffs into the lungs 2 (two) times daily. 10.7 g 11  ? Continuous Blood Gluc Receiver ([place]) DEVI 1 each by Does not apply route in the morning, at noon, and at bedtime. 1 each 3  ? Continuous Blood Gluc Sensor (DEXCOM G6 SENSOR) MISC Check sugars qac and qhs. 9 each 3  ? Continuous Blood Gluc Transmit (DEXCOM G6 TRANSMITTER) MISC 1 each by Does not apply route every 3 (three) months. 1 each 3  ?  dapagliflozin propanediol (FARXIGA) 10 MG TABS tablet Take 1 tablet (10 mg total) by mouth daily before breakfast. 90 tablet 3  ? estradiol (ESTRACE VAGINAL) 0.1 MG/GM vaginal cream Apply pea-size amount to vaginal nightly for two weeks, then twice weekly 42.5 g 12  ? fluticasone (FLONASE) 50 MCG/ACT nasal spray Place 2 sprays into both nostrils daily. 16 g 6  ? glimepiride (AMARYL) 4 MG tablet TAKE TWO TABLETS BY MOUTH BEFORE BREAKFAST 90 tablet 0  ? glucose blood (RELION GLUCOSE TEST STRIPS) test strip Use as instructed 100 each 2  ? ibuprofen (ADVIL) 800 MG tablet Take 1 tablet (800 mg total) by mouth every 8 (eight) hours as needed. 30 tablet 0  ? insulin degludec (TRESIBA FLEXTOUCH) 200 UNIT/ML FlexTouch Pen Inject 30 Units into the skin daily. (Patient taking differently: Inject 20 Units into the skin daily.) 15 mL 0  ? meloxicam  (MOBIC) 15 MG tablet TAKE ONE TABLET BY MOUTH every morning 30 tablet 1  ? metFORMIN (GLUCOPHAGE) 1000 MG tablet TAKE ONE TABLET BY MOUTH TWICE DAILY with A meal 180 tablet 1  ? Multiple Vitamin (MULTIVITAMIN) tablet Take 1 tablet by mouth daily.    ? pioglitazone (ACTOS) 30 MG tablet Take 1 tablet (30 mg total) by mouth daily. 90 tablet 1  ? pravastatin (PRAVACHOL) 40 MG tablet TAKE ONE TABLET BY MOUTH EVERYDAY AT BEDTIME 90 tablet 1  ? ramipril (ALTACE) 2.5 MG capsule TAKE ONE CAPSULE BY MOUTH EVERY MORNING 90 capsule 1  ? Semaglutide (OZEMPIC, 2 MG/DOSE, [place]) Inject 2 mg into the skin once a week.    ? spironolactone (ALDACTONE) 25 MG tablet TAKE ONE TABLET BY MOUTH BEFORE BREAKFAST 90 tablet 1  ? traMADol (ULTRAM) 50 MG tablet Take 1 - 2 tablets every 6 hours as needed for pain    ? amoxicillin-clavulanate (AUGMENTIN) 875-125 MG tablet Take 1 tablet by mouth 2 (two) times daily. 20 tablet 0  ? benzonatate (TESSALON) 100 MG capsule Take 1 capsule (100 mg total) by mouth 2 (two) times daily as needed for cough. 20 capsule 0  ? ?No facility-administered medications prior to visit.  ? ? ?Allergies  ?Allergen Reactions  ? Bactrim [Sulfamethoxazole-Trimethoprim] Swelling  ? Morphine And Related   ? ? ?Review of Systems  ?Constitutional:  Negative for chills, fatigue and fever.  ?HENT:  Positive for ear pain and postnasal drip. Negative for congestion, rhinorrhea, sinus pressure, sinus pain and sore throat.   ?Respiratory:  Positive for cough, chest tightness, shortness of breath and wheezing.   ?Cardiovascular:  Negative for chest pain.  ?Gastrointestinal:  Negative for diarrhea, nausea and vomiting.  ?Allergic/Immunologic: Positive for environmental allergies.  ?Neurological:  Negative for dizziness and headaches.  ? ?   ?Objective:  ?  ?Physical Exam ?Vitals reviewed.  ?Constitutional:   ?   Appearance: Normal appearance.  ?HENT:  ?   Head: Normocephalic.  ?   Right Ear: Tympanic membrane normal.  ?   Left Ear:  Tympanic membrane normal.  ?   Nose: Nose normal.  ?   Mouth/Throat:  ?   Mouth: Mucous membranes are moist.  ?Eyes:  ?   Pupils: Pupils are equal, round, and reactive to light.  ?Cardiovascular:  ?   Rate and Rhythm: Normal rate and regular rhythm.  ?   Pulses: Normal pulses.  ?   Heart sounds: Normal heart sounds.  ?Pulmonary:  ?   Effort: Pulmonary effort is normal.  ?   Breath sounds: Wheezing and rhonchi  present.  ?Abdominal:  ?   General: Bowel sounds are normal.  ?   Palpations: Abdomen is soft.  ?Musculoskeletal:     ?   General: Normal range of motion.  ?Skin: ?   General: Skin is warm and dry.  ?   Capillary Refill: Capillary refill takes less than 2 seconds.  ?Neurological:  ?   General: No focal deficit present.  ?   Mental Status: She is alert and oriented to person, place, and time.  ?Psychiatric:     ?   Mood and Affect: Mood normal.     ?   Behavior: Behavior normal.  ? ? ?BP 112/64   Pulse 85   Temp (!) 97.2 ?F (36.2 ?C)   Ht _0  (1.626 m)   Wt 223 lb (101.2 kg)   SpO2 98%   BMI 38.28 kg/m?   ?Wt Readings from Last 3 Encounters:  ?12/21/21 223 lb (101.2 kg)  ?12/06/21 222 lb (100.7 kg)  ?11/22/21 222 lb (100.7 kg)  ? ? ?Health Maintenance Due  ?Topic Date Due  ? DEXA SCAN  Never done  ? HIV Screening  Never done  ? Zoster Vaccines- Shingrix (1 of 2) Never done  ? COVID-19 Vaccine (3 - Moderna risk series) 08/22/2021  ? ? ? ? ? ?Lab Results  ?Component Value Date  ? TSH 1.500 11/22/2021  ? ?Lab Results  ?Component Value Date  ? WBC 11.5 (H) 11/22/2021  ? HGB 15.3 11/22/2021  ? HCT 45.7 11/22/2021  ? MCV 88 11/22/2021  ? PLT 252 11/22/2021  ? ?Lab Results  ?Component Value Date  ? NA 143 11/22/2021  ? K 4.9 11/22/2021  ? CO2 24 11/22/2021  ? GLUCOSE 79 11/22/2021  ? BUN 25 11/22/2021  ? CREATININE 0.64 11/22/2021  ? BILITOT 0.4 11/22/2021  ? ALKPHOS 55 11/22/2021  ? AST 13 11/22/2021  ? ALT 20 11/22/2021  ? PROT 7.0 11/22/2021  ? ALBUMIN 4.6 11/22/2021  ? CALCIUM 9.9 11/22/2021  ? EGFR 100  11/22/2021  ? ?Lab Results  ?Component Value Date  ? CHOL 140 11/22/2021  ? ?Lab Results  ?Component Value Date  ? HDL 58 11/22/2021  ? ?Lab Results  ?Component Value Date  ? [place] 70 11/22/2021  ? ?Lab Results  ?Com

## 2021-12-21 NOTE — Patient Instructions (Signed)
Take Doxycycline 100 mg twice daily for 7 days ?Use Duoneb nebulizer treatments every 6 hours as needed for shortness of breath ?Use Albuteroll rescue inhaler as needed ?Continue Breztri inhaler twice daily ?We will call you with pulmonology appointment ?Follow-up as needed ? ? ?Chronic Obstructive Pulmonary Disease Exacerbation ?Chronic obstructive pulmonary disease (COPD) is a long-term (chronic) lung problem. In COPD, the flow of air from the lungs is limited. ?COPD exacerbations are times that breathing gets worse and you need more than your normal treatment. Without treatment, they can be life-threatening. If they happen often, your lungs can become more damaged. ?What are the causes? ?Having infections that affect your airways and lungs. ?Being exposed to: ?Smoke. ?Air pollution. ?Chemical fumes. ?Dust. ?Things that can cause an allergic reaction (allergens). ?Not taking your usual COPD medicines as told. ?Having medical problems already, such as heart failure or infections not involving the lungs. ?In many cases, the cause is not known. ?What increases the risk? ?Smoking. ?Being an older adult. ?Having frequent prior COPD exacerbations. ?What are the signs or symptoms? ?Increased coughing. ?Increased mucus from your lungs. ?Increased wheezing. ?Increased shortness of breath. ?Fast breathing and finding it hard to breathe. ?Chest tightness. ?Less energy than usual. ?Sleep disruption from symptoms. ?Confusion. ?Increased sleepiness. ?Often, these symptoms happen or get worse even with the use of medicines. ?How is this treated? ?Treatment for this condition depends on how bad it is and the cause of the symptoms. You may need to stay in the hospital for treatment. Treatment may include: ?Taking medicines. ?Using oxygen. ?Being treated with different ways to clear your airway, such as using a mask to deliver oxygen. ?Follow these instructions at home: ?Medicines ?Take over-the-counter and prescription medicines  only as told by your doctor. ?Use all inhaled medicines the correct way. ?If you were prescribed an antibiotic or steroid medicine, take it as told by your doctor. Do not stop taking it even if you start to feel better. ?Lifestyle ?Do not smoke or use any products that contain nicotine or tobacco. If you need help quitting, ask your doctor. ?Eat healthy foods. ?Exercise regularly. ?Get enough sleep. Most adults need 7 or more hours per night. ?Avoid tobacco smoke and other things that can bother your lungs. ?Several times a day, wash your hands with soap and water for at least 20 seconds. If you cannot use soap and water, use hand sanitizer. This may help keep you from getting an infection. ?During flu season, avoid areas that are crowded with people. ?General instructions ?Drink enough fluid to keep your pee (urine) pale yellow. Do not do this if your doctor has told you not to. ?Use a cool mist machine (vaporizer). ?If you use oxygen or a machine that turns medicine into a mist (nebulizer), continue to use it as told. ?Keep all follow-up visits. ?How is this prevented? ?Keep up with shots (vaccinations) as told by your doctor. Be sure to get a yearly flu (influenza) shot. ?If you smoke, quit smoking. Smoking makes the problem worse. ?Follow all instructions for rehabilitation. These are steps you can take to make your body work better. ?Work with your doctor to develop and follow an action plan. This tells you what steps to take when you experience certain symptoms. ?Contact a doctor if: ?Your COPD symptoms get worse than normal. ?Get help right away if: ?You are short of breath and it gets worse, even when you are resting. ?You have trouble talking. ?You have chest pain. ?You cough up blood. ?  You have a fever. ?You keep vomiting. ?You feel weak or you pass out (faint). ?You feel confused. ?You are not able to sleep because of your symptoms. ?You have trouble doing daily activities. ?These symptoms may be an  emergency. Get help right away. Call your local emergency services (911 in the [place] ?Do not wait to see if the symptoms will go away. ?Do not drive yourself to the hospital. ?Summary ?COPD exacerbations are times that breathing gets worse and you need more treatment than normal. ?COPD exacerbations can be very serious and may cause your lungs to become more damaged. ?Do not smoke. If you need help quitting, ask your doctor. ?Stay up to date on your shots. Get a flu shot every year. ?This information is not intended to replace advice given to you by your health care provider. Make sure you discuss any questions you have with your health care provider. ?Document Revised: 07/27/2020 Document Reviewed: 07/12/2020 ?Elsevier Patient Education ? [place]. ? ?

## 2021-12-21 NOTE — Progress Notes (Signed)
? ? ?Chronic Care Management ?Pharmacy Assistant  ? ?Name: Brandi Velazquez  MRN: [id number] DOB: 25-Sep-1960 ? ? ?Reason for Encounter: Medication Coordination for [facility]  ?  ?Recent office visits:  ?12/12/21 Meredith Mody RN. Telephone Encounter. Mrs. Nolet called for samples of ozempic.  We currently do not have any samples.  Dr. Tobie Poet approved for her to begin Rybelsus 3 mg daily until ozempic is available.  She is going to pick-up samples of rybelsus 3 mg (30 day supply).   ? ?12/06/21 Jerrell Belfast NP. Seen for Acute Sinusitis. Started on Augmentin 875-'125mg'$ , Benzonatate '100mg'$ , Breztri 160-9-4.71mg Inhaler, and Flonase nasal spray 542m.  ? ?11/22/21 CoRochel BromeD. Seen for DM, HLD and HTN. Start on [place] puffs twice daily. Change glimepiride to 4 mg one before breakfast and one before either lunch or supper.  ? ?11/08/21 MaPhilipp OvensMA. Orders only. Ordered Glimepiride '4mg'$ .  ? ?09/27/21 CoRochel BromeD. Seen for Cough and Sinusitis. Started Azithromycin '250mg'$  and Cefdinir '300mg'$ . Recommend use albuterol inhaler 2 puffs four times a day for 2 days, then change to 2 puffs four times a day as needed.  ? ?Recent consult visits:  ?None ? ?Hospital visits:  ?None ? ?Medications: ?Outpatient Encounter Medications as of 12/21/2021  ?Medication Sig Note  ? albuterol (VENTOLIN HFA) 108 (90 Base) MCG/ACT inhaler Inhale 2 puffs into the lungs every 6 (six) hours as needed for wheezing or shortness of breath.   ? amoxicillin-clavulanate (AUGMENTIN) 875-125 MG tablet Take 1 tablet by mouth 2 (two) times daily.   ? aspirin 81 MG chewable tablet Chew by mouth daily.   ? benzonatate (TESSALON) 100 MG capsule Take 1 capsule (100 mg total) by mouth 2 (two) times daily as needed for cough.   ? Budeson-Glycopyrrol-Formoterol (BREZTRI AEROSPHERE) 160-9-4.8 MCG/ACT AERO Inhale 2 puffs into the lungs 2 (two) times daily.   ? Continuous Blood Gluc Receiver ([place]I 1 each by Does not apply route in the morning,  at noon, and at bedtime.   ? Continuous Blood Gluc Sensor (DEXCOM G6 SENSOR) MISC Check sugars qac and qhs.   ? Continuous Blood Gluc Transmit (DEXCOM G6 TRANSMITTER) MISC 1 each by Does not apply route every 3 (three) months.   ? dapagliflozin propanediol (FARXIGA) 10 MG TABS tablet Take 1 tablet (10 mg total) by mouth daily before breakfast.   ? estradiol (ESTRACE VAGINAL) 0.1 MG/GM vaginal cream Apply pea-size amount to vaginal nightly for two weeks, then twice weekly   ? fluticasone (FLONASE) 50 MCG/ACT nasal spray Place 2 sprays into both nostrils daily.   ? glimepiride (AMARYL) 4 MG tablet TAKE TWO TABLETS BY MOUTH BEFORE BREAKFAST   ? glucose blood (RELION GLUCOSE TEST STRIPS) test strip Use as instructed   ? ibuprofen (ADVIL) 800 MG tablet Take 1 tablet (800 mg total) by mouth every 8 (eight) hours as needed.   ? insulin degludec (TRESIBA FLEXTOUCH) 200 UNIT/ML FlexTouch Pen Inject 30 Units into the skin daily. (Patient taking differently: Inject 20 Units into the skin daily.) 05/23/2021: Per Dr. CoAlyse Lowecommendation patient is to decrease to 20 units daily when beginning Ozempic 2 mg weekly  ? meloxicam (MOBIC) 15 MG tablet TAKE ONE TABLET BY MOUTH every morning   ? metFORMIN (GLUCOPHAGE) 1000 MG tablet TAKE ONE TABLET BY MOUTH TWICE DAILY with A meal   ? Multiple Vitamin (MULTIVITAMIN) tablet Take 1 tablet by mouth daily.   ? pioglitazone (ACTOS) 30 MG tablet TAKE ONE TABLET BY MOUTH  BEFORE BREAKFAST   ? pravastatin (PRAVACHOL) 40 MG tablet TAKE ONE TABLET BY MOUTH EVERYDAY AT BEDTIME   ? ramipril (ALTACE) 2.5 MG capsule TAKE ONE CAPSULE BY MOUTH EVERY MORNING   ? Semaglutide (OZEMPIC, 2 MG/DOSE, [place]) Inject 2 mg into the skin once a week.   ? spironolactone (ALDACTONE) 25 MG tablet TAKE ONE TABLET BY MOUTH BEFORE BREAKFAST   ? traMADol (ULTRAM) 50 MG tablet Take 1 - 2 tablets every 6 hours as needed for pain   ? ?No facility-administered encounter medications on file as of 12/21/2021.  ? ? ?Reviewed chart for  medication changes ahead of medication coordination call. ? ?No Consults, or hospital visits since last care coordination call/Pharmacist visit. ? ? ?BP Readings from Last 3 Encounters:  ?12/06/21 114/62  ?11/22/21 132/74  ?09/27/21 110/60  ?  ?Lab Results  ?Component Value Date  ? HGBA1C 6.7 (H) 11/22/2021  ?  ? ?Patient obtains medications through Adherence Packaging  90 Days  ? ?Last adherence delivery included:  ?Pravastatin 40 mg- 1 tablet daily (bedtime) ?Pioglitazone 30 mg- 1 tablet daily  (before breakfast) ?Spironolactone 25 mg- 1 tablet daily (before breakfast) ?Metformin 1000 mg- 1 tablet twice daily (breakfast, evening meal) ?Ramipril 2.5 mg-1 tablet daily (breakfast) ?Meloxicam 15 mg 1 tablet at (breakfast) ? ?Patient declined (meds) last delivery ?Patient gets Patient Assistance for the following: ?Farxiga '10mg'$  ?Ozempic 2 mg ?Tresiba 200 units ?Alubterol 108 Inhaler Has plenty of supply. Only uses PRN last fill 07/18/21 25ds ?Duoneb inhaler Has plenty of supply. Only uses PRN  ? ?Patient is due for next adherence delivery on: 01/02/22. ?Called patient and reviewed medications and coordinated delivery. ? ?This delivery to include: ?Metformin HCI '1000mg'$  1 B, 1 EM ?Spironolactone '25mg'$  1 BB ?Pravastatin '40mg'$  1 BT ?Ramipril 2.'5mg'$  1 B ?Pioglitazone HCI '30mg'$  1 BB ?Glimepiride '4mg'$  Two tablets before breakfast ([place]ott[place])  ?(Note: Pt is back on only temporary until she gets her Ozempic in the mail from [facility]. She only has a week left and is still waiting on her shipment. ) ? ?Patient declined the following medications  ?Patient gets Patient Assistance for the following: ?Farxiga '10mg'$  ?Ozempic 2 mg ?Tresiba 200 units ?Meloxicam '15mg'$ - Denied refills through provider. Pt will schedule f/u  ?Breztri- Received a supply through [facility] 12/12/21 30ds ?Albuterol Inhaler- Only uses prn, does not need last filled 10/03/21 25ds ? ?Patient needs refills- Request Sent  ?Pioglitazone HCI '30mg'$   ? ?Confirmed delivery date of 01/02/22,  advised patient that pharmacy will contact them the morning of delivery. ? ?[name], CMA ?Clinical Pharmacist Assistant  ?[phone]  ?

## 2021-12-22 NOTE — Telephone Encounter (Signed)
Compliant on meds 

## 2021-12-27 DIAGNOSIS — H40023 Open angle with borderline findings, high risk, bilateral: Secondary | ICD-10-CM | POA: Diagnosis not present

## 2021-12-27 NOTE — Telephone Encounter (Signed)
Request from [place] at [facility] for Glimepiride to [facility] ?

## 2021-12-29 ENCOUNTER — Other Ambulatory Visit: Payer: Self-pay

## 2021-12-29 MED ORDER — GLIMEPIRIDE 4 MG PO TABS: ORAL_TABLET | ORAL | 0 refills | Status: DC

## 2022-01-03 ENCOUNTER — Other Ambulatory Visit: Payer: Self-pay | Admitting: Family Medicine

## 2022-01-03 ENCOUNTER — Telehealth: Payer: Self-pay

## 2022-01-03 MED ORDER — ALBUTEROL SULFATE (2.5 MG/3ML) 0.083% IN NEBU: 2.5000 mg | INHALATION_SOLUTION | Freq: Four times a day (QID) | RESPIRATORY_TRACT | 1 refills | Status: DC | PRN

## 2022-01-03 NOTE — Telephone Encounter (Signed)
Spoke with patient, verbalized understanding and had no questions at this time.  

## 2022-01-03 NOTE — Telephone Encounter (Signed)
Patient states [facility] in [place] stated that they could not mixed the 2 medications for her nebulizer. Patient states the pharmacy stated that the prescription would have to changed to something else. Pharmacy can not get ipratropium but they do have albuterol.  ?

## 2022-01-18 ENCOUNTER — Encounter: Payer: Self-pay | Admitting: Internal Medicine

## 2022-01-18 ENCOUNTER — Ambulatory Visit: Payer: Medicare HMO | Admitting: Internal Medicine

## 2022-01-18 VITALS — BP 130/74 | HR 70 | Temp 97.7°F | Ht 64.25 in | Wt 222.6 lb

## 2022-01-18 DIAGNOSIS — R0602 Shortness of breath: Secondary | ICD-10-CM | POA: Diagnosis not present

## 2022-01-18 DIAGNOSIS — J410 Simple chronic bronchitis: Secondary | ICD-10-CM | POA: Diagnosis not present

## 2022-01-18 MED ORDER — ALBUTEROL SULFATE (2.5 MG/3ML) 0.083% IN NEBU: 2.5000 mg | INHALATION_SOLUTION | Freq: Four times a day (QID) | RESPIRATORY_TRACT | 12 refills | Status: DC | PRN

## 2022-01-18 NOTE — Patient Instructions (Signed)
Please schedule follow up scheduled with myself in 1 months.  If my schedule is not open yet, we will contact you with a reminder closer to that time. Please call [phone] if you haven't heard from [place] a month before.  ? ?Before your next visit I would like you to have: ?Full set of PFTs before next appt ? ?Continue breztri 2 puffs twice a day, gargle after use ? ?Take the albuterol rescue inhaler every 4 to 6 hours as needed for wheezing or shortness of breath. ?You can also take it 15 minutes before exercise or exertional activity. ?Side effects include heart racing or pounding, jitters or anxiety. If you have a history of an irregular heart rhythm, it can make this worse. Can also give some patients a hard time sleeping. ? ?Flonase - 1 spray on each side of your nose twice a day for first week, then 1 spray on each side.  ? Instructions for use: ?If you also use a saline nasal spray or rinse, use that first. ?Position the head with the chin slightly tucked. Use the right hand to spray into the left nostril and the right hand to spray into the left nostril.   Point the bottle away from the septum of your nose (cartilage that divides the two sides of your nose).  ?Hold the nostril closed on the opposite side from where you will spray ?Spray once and gently sniff to pull the medicine into the higher parts of your nose.  Don't sniff too hard as the medicine will drain down the back of your throat instead. ?Repeat with a second spray on the same side if prescribed. ?Repeat on the other side of your nose. ? ?Understanding COPD  ? ?What is COPD? ?COPD stands for chronic obstructive pulmonary (lung) disease. COPD is a general term used for several lung diseases.  COPD is an umbrella term and encompasses other  common diseases in this group like chronic bronchitis and emphysema. Chronic asthma may also be included in this group. While some patients with COPD have only chronic bronchitis or emphysema, most patients  have a combination of both.  You might hear these terms used in exchange for one another.  ? ?COPD adds to the work of the heart. Diseased lungs may reduce the amount of oxygen that goes to the blood. High blood pressure in blood vessels from the heart to the lungs makes it difficult for the heart to pump. Lung disease can also cause the body to produce too many red blood cells which may make the blood thicker and harder to pump.  ? ?Patients who have COPD with low oxygen levels may develop an enlarged heart (cor pulmonale). This condition weakens the heart and causes increased shortness of breath and swelling in the legs and feet.  ? ?Chronic bronchitis ?Chronic bronchitis is irritation and inflammation (swelling) of the lining in the bronchial tubes (air passages). The irritation causes coughing and an excess amount of mucus in the airways. The swelling makes it difficult to get air in and out of the lungs. The small, hair-like structures on the inside of the airways (called cilia) may be damaged by the irritation. The cilia are then unable to help clean mucus from the airways.  ?Bronchitis is generally considered to be chronic when you have: a productive cough (cough up mucus) and shortness of breath that lasts about 3 months or more each year for 2 or more years in a row. Your doctor may define  chronic bronchitis differently.  ? ?Emphysema ?Emphysema is the destruction, or breakdown, of the walls of the alveoli (air sacs) located at the end of the bronchial tubes. The damaged alveoli are not able to exchange oxygen and carbon dioxide between the lungs and the blood. The bronchioles lose their elasticity and collapse when you exhale, trapping air in the lungs. The trapped air keeps fresh air and oxygen from entering the lungs.  ? ?Who is affected by COPD? ?Emphysema and chronic bronchitis affect approximately 16 million people in the [place], or close to 11 percent of the population.  ? ?Symptoms of COPD   ?Shortness of breath  ?Shortness of breath with mild exercise (walking, using the stairs, etc.)  ?Chronic, productive cough (with mucus)  ?A feeling of "tightness" in the chest  ?Wheezing  ? ?What causes COPD? ?The two primary causes of COPD are cigarette smoking and alpha1-antitrypsin (AAT) deficiency. Air pollution and occupational dusts may also contribute to COPD, especially when the person exposed to these substances is a cigarette smoker.  ?Cigarette smoke causes COPD by irritating the airways and creating inflammation that narrows the airways, making it more difficult to breathe. Cigarette smoke also causes the cilia to stop working properly so mucus and trapped particles are not cleaned from the airways. As a result, chronic cough and excess mucus production develop, leading to chronic bronchitis.  ?In some people, chronic bronchitis and infections can lead to destruction of the small airways, or emphysema.  ?AAT deficiency, an inherited disorder, can also lead to emphysema. Alpha antitrypsin (AAT) is a protective material produced in the liver and transported to the lungs to help combat inflammation. When there is not enough of the chemical AAT, the body is no longer protected from an enzyme in the white blood cells.  ? ?How is COPD diagnosed?  ?To diagnose COPD, the physician needs to know: ?Do you smoke?  ?Have you had chronic exposure to dust or air pollutants?  ?Do other members of your family have lung disease?  ?Are you short of breath?  ?Do you get short of breath with exercise?  ?Do you have chronic cough and/or wheezing?  ?Do you cough up excess mucus?  ?To help with the diagnosis, the physician will conduct a thorough physical exam which includes:  ?Listening to your lungs and heart  ?Checking your blood pressure and pulse  ?Examining your nose and throat  ?Checking your feet and ankles for swelling  ? ?Laboratory and other tests ?Several laboratory and other tests are needed to confirm a  diagnosis of COPD. These tests may include:  ?Chest X-ray to look for lung changes that could be caused by COPD  ? Spirometry and pulmonary function tests (PFTs) to determine lung volume and air flow  ?Pulse oximetry to measure the saturation of oxygen in the blood  ?Arterial blood gases (ABGs) to determine the amount of oxygen and carbon dioxide in the blood  ?Exercise testing to determine if the oxygen level in the blood drops during exercise  ? ?Treatment ?In the beginning stages of COPD, there is minimal shortness of breath that may be noticed only during exercise. As the disease progresses, shortness of breath may worsen and you may need to wear an oxygen device.  ? ?To help control other symptoms of COPD, the following treatments and lifestyle changes may be prescribed.  ?Quitting smoking  ?Avoiding cigarette smoke and other irritants  ?Taking medications including: ?a. bronchodilators ?b. anti-inflammatory agents ?c. oxygen ?d. antibiotics  ?  Maintaining a healthy diet  ?Following a structured exercise program such as pulmonary rehabilitation ?Preventing respiratory infections  ?Controlling stress  ? ?If your COPD progresses, you may be eligible to be evaluated for lung volume reduction surgery or lung transplantation. You may also be eligible to participate in certain clinical trials (research studies). Ask your health care providers about studies being conducted in your hospital.  ? ?What is the outlook? ?Although COPD can not be cured, its symptoms can be treated and your quality of life can be improved. Your prognosis or outlook for the future will depend on how well your lungs are functioning, your symptoms, and how well you respond to and follow your treatment plan.  ? ? ?

## 2022-01-18 NOTE — Progress Notes (Signed)
? ?      ?Brandi Velazquez    [id number]    11/19/1960 ? ?Primary Care Physician:[name], [name], MD ? ?Referring Physician: Rip Harbour, NP ?[address] ?[place],  [place] [address] ?Reason for Consultation: shortness of breath ?Date of Consultation: 01/18/2022 ? ?Chief complaint:   ?Chief Complaint  ?Patient presents with  ? Consult  ?  Pt states she has had problems with her breathing for the past 5 months. States she has been on multiple rounds of antibiotics and steroids but still is having problems with her breathing. Also has complains of coughing and also occasional wheezing.  ?  ? ?HPI: ?Brandi Velazquez is a 61 y.o. woman with history of tobacco use disorder who presents with worsening shrotness of breath and recurrent bronchitis.  ? ?Notices recurrent issues of bronchitis which has progressed since January 2023. Has had four rounds of prednisone and antibiotics since then.  ? ?Current therapy - started on breztri last month.  ?Also has albuterol inhaler with nebulizer therapy. Hasn't received nebs yet.  ?She takes albuterol 2-3 times/week.  ? ?She has daily cough with thick mucus production. Also has shortness of breath, sometimes with chest tightness and wheezing. Has coughing spells which last 10 minutes a couple times a day.  ? ?Fatigue worsens when exerting and playing with grandchildren.  ? ?Has been hospitalized for pneumonia, last time was 10 years ago.  ?No childhood respiratory disease.  ? ?Social history: ? ?Occupation: previously worked in Environmental consultant.  ?Exposures: lives at home with daughter and grandchildren 24 year old twins.  ?Smoking Hisotry: passive smoke exposure with parents and spouse. Herself smoked for about 15 years. ? ?Social History  ? ?Occupational History  ? Not on file  ?Tobacco Use  ? Smoking status: Former  ?  Packs/day: 1.50  ?  Years: 15.00  ?  Pack years: 22.50  ?  Types: Cigarettes  ?  Quit date: 03/20/2009  ?  Years since quitting: 12.8  ? Smokeless tobacco:  Never  ?Vaping Use  ? Vaping Use: Never used  ?Substance and Sexual Activity  ? Alcohol use: No  ? Drug use: No  ? Sexual activity: Not on file  ? ? ?Relevant family history: ? ?Family History  ?Problem Relation Age of Onset  ? Diabetes Mother   ? Heart disease Mother   ? Cancer Father   ? Stroke Sister   ? Lung disease Neg Hx   ? ? ?Past Medical History:  ?Diagnosis Date  ? Ankle pain   ? Chronic pain   ? back  ? COPD (chronic obstructive pulmonary disease) ([place])   ? Depression   ? Diabetes mellitus   ? GERD (gastroesophageal reflux disease)   ? Hyperlipidemia   ? Hypertension   ? Low back pain   ? Obstructive sleep apnea   ? Pneumonia 2010  ? Post-menopausal atrophic vaginitis   ? Superficial phlebitis   ? ? ?Past Surgical History:  ?Procedure Laterality Date  ? BACK SURGERY  1990  ? CESAREAN SECTION    ? CYST REMOVAL HAND    ? inner thigh  ? SPINE SURGERY    ? lumbar laminectomy and diskectomy  ? ? ? ?Physical Exam: ?Blood pressure 130/74, pulse 70, temperature 97.7 ?F (36.5 ?C), temperature source Oral, height 5' 4.25" (1.632 m), weight 222 lb 9.6 oz (101 kg), SpO2 100 %. ?Gen:      No acute distress ?ENT:  no nasal polyps,  mucus membranes moist ?Lungs:    No increased respiratory effort, symmetric chest wall excursion,bilateral end expiratory wheezing ?CV:         Regular rate and rhythm; no murmurs, rubs, or gallops.  No pedal edema ?Abd:      + bowel sounds; soft, non-tender; no distension ?MSK: no acute synovitis of DIP or PIP joints, no mechanics hands.  ?Skin:      Warm and dry; no rashes ?Neuro: normal speech, no focal facial asymmetry ?Psych: alert and oriented x3, normal mood and affect ? ? ?Data Reviewed/Medical Decision Making: ? ?Independent interpretation of tests: ?Imaging: ? Review of patient's CT Chest March 2023 images revealed mild emphysema. The patient's images have been independently reviewed by me.   ? ?PFTs: ?I have personally reviewed the patient's PFTs and  ?   ? View : No data to  display.  ?  ?  ?  ? ? ?Labs:  ?Lab Results  ?Component Value Date  ? WBC 11.5 (H) 11/22/2021  ? HGB 15.3 11/22/2021  ? HCT 45.7 11/22/2021  ? MCV 88 11/22/2021  ? PLT 252 11/22/2021  ? ?Lab Results  ?Component Value Date  ? NA 143 11/22/2021  ? K 4.9 11/22/2021  ? CL 105 11/22/2021  ? CO2 24 11/22/2021  ? ? ? ?Immunization status:  ?Immunization History  ?Administered Date(s) Administered  ? Influenza Inj Mdck Quad Pf 07/18/2020, 05/18/2021  ? Moderna Covid-19 Vaccine Bivalent Booster 63yr & up 08/22/2021  ? Moderna SARS-COV2 Booster Vaccination 02/09/2021  ? Moderna Sars-Covid-2 Vaccination 10/07/2019, 11/02/2019  ? Pneumococcal Polysaccharide-23 10/09/2011  ? Tdap 05/15/2018  ? ? ? I reviewed prior external note(s) from pcp ? I reviewed the result(s) of the labs and imaging as noted above.  ? I have ordered pfts ? ?Assessment:  ?Chronic bronchitis ?Allergic rhinitis ?History of tobacco use ? ?Plan/Recommendations: ?Continue breztri with prn albuterol.  ?Continue flonase - we modified technique today.  ?Will obtain pfts to evaluate for disease severity.  ? ?We discussed disease management and progression at length today.  ? ? ?Return to Care: ?Return in about 4 weeks (around 02/15/2022). ? ?NLenice Llamas MD ?Pulmonary and Critical Care Medicine ?[place]?[phone] ? ?CC: HRip Harbour NP ? ? ? ?

## 2022-01-19 ENCOUNTER — Other Ambulatory Visit: Payer: Self-pay | Admitting: Family Medicine

## 2022-01-19 ENCOUNTER — Other Ambulatory Visit: Payer: Self-pay | Admitting: Podiatry

## 2022-02-05 ENCOUNTER — Encounter: Payer: Self-pay | Admitting: Emergency Medicine

## 2022-02-05 ENCOUNTER — Ambulatory Visit: Payer: Medicare HMO | Admitting: Emergency Medicine

## 2022-02-05 DIAGNOSIS — Z0000 Encounter for general adult medical examination without abnormal findings: Secondary | ICD-10-CM | POA: Diagnosis not present

## 2022-02-05 DIAGNOSIS — J449 Chronic obstructive pulmonary disease, unspecified: Secondary | ICD-10-CM | POA: Diagnosis not present

## 2022-02-05 NOTE — Progress Notes (Signed)
Annual Wellness Visit  Subjective:   Brandi Velazquez is a 61 y.o. Female who presents for Medicare Annual initial preventive examination.  I connected with  Larwance Rote on 02/05/22 by a audio enabled telemedicine application and verified that I am speaking with the correct person using two identifiers.  Patient Location: Home  Provider Location: Home Office  I discussed the limitations of evaluation and management by telemedicine. The patient expressed understanding and agreed to proceed.   Brandi Velazquez is a 61 y.o. female who presents today for her Annual Wellness Visit.  She generally feels well. She reports sleeping poorly - due to cpap machine. She does not have additional problems to discuss today.   She lives with her daughter and twin 55 year old grandchildren       Objective:    There were no vitals filed for this visit.   BP Readings from Last 3 Encounters:  01/18/22 130/74  12/21/21 112/64  12/06/21 114/62   Wt Readings from Last 3 Encounters:  01/18/22 222 lb 9.6 oz (101 kg)  12/21/21 223 lb (101.2 kg)  12/06/21 222 lb (100.7 kg)      There is no height or weight on file to calculate BMI.     07/13/2016   11:06 AM  Advanced Directives  Does Patient Have a Medical Advance Directive? No  Would patient like information on creating a medical advance directive? No - patient declined information    Current Medications (verified) Outpatient Encounter Medications as of 02/05/2022  Medication Sig   albuterol (PROVENTIL) (2.5 MG/3ML) 0.083% nebulizer solution Take 3 mLs (2.5 mg total) by nebulization every 6 (six) hours as needed for wheezing or shortness of breath.   albuterol (VENTOLIN HFA) 108 (90 Base) MCG/ACT inhaler Inhale 2 puffs into the lungs every 6 (six) hours as needed for wheezing or shortness of breath.   Aspirin 81 MG CAPS Take 81 mg by mouth daily.   Budeson-Glycopyrrol-Formoterol (BREZTRI AEROSPHERE) 160-9-4.8 MCG/ACT AERO Inhale 2  puffs into the lungs 2 (two) times daily.   Continuous Blood Gluc Receiver ([place]) DEVI 1 each by Does not apply route in the morning, at noon, and at bedtime.   Continuous Blood Gluc Sensor (DEXCOM G6 SENSOR) MISC Check sugars qac and qhs.   Continuous Blood Gluc Transmit (DEXCOM G6 TRANSMITTER) MISC 1 each by Does not apply route every 3 (three) months.   dapagliflozin propanediol (FARXIGA) 10 MG TABS tablet Take 1 tablet (10 mg total) by mouth daily before breakfast.   fluticasone (FLONASE) 50 MCG/ACT nasal spray Place 2 sprays into both nostrils daily.   glucose blood (RELION GLUCOSE TEST STRIPS) test strip Use as instructed   ibuprofen (ADVIL) 800 MG tablet Take 1 tablet (800 mg total) by mouth every 8 (eight) hours as needed.   insulin degludec (TRESIBA FLEXTOUCH) 200 UNIT/ML FlexTouch Pen Inject 30 Units into the skin daily. (Patient taking differently: Inject 20 Units into the skin daily.)   metFORMIN (GLUCOPHAGE) 1000 MG tablet TAKE ONE TABLET BY MOUTH TWICE DAILY with A meal   Multiple Vitamin (MULTIVITAMIN) tablet Take 1 tablet by mouth daily.   pioglitazone (ACTOS) 30 MG tablet Take 1 tablet (30 mg total) by mouth daily.   pravastatin (PRAVACHOL) 40 MG tablet TAKE ONE TABLET BY MOUTH EVERYDAY AT BEDTIME   ramipril (ALTACE) 2.5 MG capsule TAKE ONE CAPSULE BY MOUTH EVERY MORNING   Semaglutide (OZEMPIC, 2 MG/DOSE, [place]) Inject 2 mg into the skin once a  week.   spironolactone (ALDACTONE) 25 MG tablet TAKE ONE TABLET BY MOUTH BEFORE BREAKFAST   traMADol (ULTRAM) 50 MG tablet Take 1 - 2 tablets every 6 hours as needed for pain   Facility-Administered Encounter Medications as of 02/05/2022  Medication   ipratropium-albuterol (DUONEB) 0.5-2.5 (3) MG/3ML nebulizer solution 3 mL    Allergies (verified) Bactrim [sulfamethoxazole-trimethoprim] and Morphine and related   History: Past Medical History:  Diagnosis Date   Ankle pain    Chronic pain    back   COPD (chronic  obstructive pulmonary disease) (HCC)    Depression    Diabetes mellitus    GERD (gastroesophageal reflux disease)    Hyperlipidemia    Hypertension    Low back pain    Obstructive sleep apnea    Pneumonia 2010   Post-menopausal atrophic vaginitis    Superficial phlebitis    Past Surgical History:  Procedure Laterality Date   BACK SURGERY  1990   CESAREAN SECTION     CYST REMOVAL HAND     inner thigh   SPINE SURGERY     lumbar laminectomy and diskectomy   Family History  Problem Relation Age of Onset   Diabetes Mother    Heart disease Mother    Cancer Father    Diabetes Sister    Stroke Sister    Lung disease Neg Hx    Social History   Socioeconomic History   Marital status: Single    Spouse name: Not on file   Number of children: Not on file   Years of education: Not on file   Highest education level: Not on file  Occupational History   Not on file  Tobacco Use   Smoking status: Former    Packs/day: 1.50    Years: 15.00    Pack years: 22.50    Types: Cigarettes    Quit date: 03/20/2009    Years since quitting: 12.8   Smokeless tobacco: Never  Vaping Use   Vaping Use: Never used  Substance and Sexual Activity   Alcohol use: No   Drug use: No   Sexual activity: Not on file  Other Topics Concern   Not on file  Social History Narrative   Not on file   Social Determinants of Health   Financial Resource Strain: Not on file  Food Insecurity: No Food Insecurity   Worried About Running Out of Food in the Last Year: Never true   Ran Out of Food in the Last Year: Never true  Transportation Needs: No Transportation Needs   Lack of Transportation (Medical): No   Lack of Transportation (Non-Medical): No  Physical Activity: Inactive   Days of Exercise per Week: 0 days   Minutes of Exercise per Session: 0 min  Stress: Not on file  Social Connections: Unknown   Frequency of Communication with Friends and Family: More than three times a week   Frequency of  Social Gatherings with Friends and Family: More than three times a week   Attends Religious Services: 1 to 4 times per year   Active Member of [employer] or Organizations: No   Attends Archivist Meetings: Never   Marital Status: Not on file    Tobacco Counseling Counseling given: Not Answered       Diabetic?yes - recommended daily foot checks; pt wears shoes in the house         Activities of Daily Living    02/05/2022    3:14 PM  In your  present state of health, do you have any difficulty performing the following activities:  Hearing? 0  Vision? 0  Difficulty concentrating or making decisions? 0  Walking or climbing stairs? 1  Comment due to back problems - sees spine surgeon  Dressing or bathing? 0  Doing errands, shopping? 1  Comment gets nervous driving in [place]    Patient Care Team: [name], MD as PCP - General (Family Medicine) Burnice Logan, [facility] (Inactive) as Pharmacist (Pharmacist)  Indicate any recent Medical Services you may have received from other than [facility] providers in the past year (date may be approximate).     Assessment:   This is a routine wellness examination for [place].  Hearing/Vision screen No results found.  Dietary issues and exercise activities discussed:     Goals Addressed   None    Depression Screen    02/05/2022    3:16 PM 07/18/2021   11:21 AM 07/18/2020   10:09 AM 07/13/2016   10:55 AM  PHQ 2/9 Scores  PHQ - 2 Score 0 0 0 0    Fall Risk    02/05/2022    3:13 PM 07/18/2021   11:20 AM 10/27/2020    9:19 AM  [place] in the past year? 0 0 0  Number falls in past yr: 0 0 0  Injury with Fall? 0 0 0  Risk for fall due to :  No Fall Risks No Fall Risks  Follow up  Falls evaluation completed Falls evaluation completed    [place]:  Any stairs in or around the home? No  If so, are there any without handrails? No  Home free of loose throw rugs in walkways, pet  beds, electrical cords, etc? No  Adequate lighting in your home to reduce risk of falls? Yes   ASSISTIVE DEVICES UTILIZED TO PREVENT FALLS:  Life alert? No  Use of a cane, walker or w/c? Yes  supposed to use a walker but does not - uses wc for long distances such as at amusement park - wants to inquire about wc for this purpose Grab bars in the bathroom? Yes  Shower chair or bench in shower? No  Elevated toilet seat or a handicapped toilet? No     Cognitive Function:        02/05/2022    3:17 PM  6CIT Screen  What time? 0 points  Count back from 20 0 points  Months in reverse 0 points  Repeat phrase 6 points    Immunizations Immunization History  Administered Date(s) Administered   Influenza Inj Mdck Quad Pf 07/18/2020, 05/18/2021   Moderna Covid-19 Vaccine Bivalent Booster 96yr & up 08/22/2021   Moderna SARS-COV2 Booster Vaccination 02/09/2021   MLombardSars-Covid-2 Vaccination 10/07/2019, 11/02/2019   Pneumococcal Polysaccharide-23 10/09/2011   Tdap 05/15/2018    TDAP status: Up to date  Flu Vaccine status: Up to date  Not yet eligible for pneumonia vaccine by age - consider giving earlier due to smoking hx and COPD  Covid-19 vaccine status: Completed vaccines  Qualifies for Shingles Vaccine? Yes   - Advised to get at pharmacy  Screening Tests Health Maintenance  Topic Date Due   DEXA SCAN  Never done   Zoster Vaccines- Shingrix (1 of 2) Never done   COVID-19 Vaccine (3 - Moderna risk series) 08/22/2021   HIV Screening  02/06/2023 (Originally 03/25/1976)   OPHTHALMOLOGY EXAM  03/07/2022   INFLUENZA VACCINE  04/17/2022  HEMOGLOBIN A1C  05/25/2022   MAMMOGRAM  10/27/2022   FOOT EXAM  11/23/2022   PAP SMEAR-Modifier  06/06/2024   COLONOSCOPY (Pts 45-59yr Insurance coverage will need to be confirmed)  01/02/2027   TETANUS/TDAP  05/15/2028   HPV VACCINES  Aged Out   Hepatitis C Screening  Discontinued    Health Maintenance  Health Maintenance Due   Topic Date Due   DEXA SCAN  Never done   Zoster Vaccines- Shingrix (1 of 2) Never done   COVID-19 Vaccine (3 - Moderna risk series) 08/22/2021    Colorectal cancer screening: Type of screening: Colonoscopy. Completed 2018. Repeat every 10 years  Mammogram status: Completed 2022. Repeat every year repeat in 2 years 2024  DExa scan to start at age 62 Lung Cancer Screening: (Low Dose CT Chest recommended if Age 62-80years, 30 pack-year currently smoking OR have quit w/in 15years.) does not qualify. Quit 15 years ago  Lung Cancer Screening Referral: n/a  Additional Screening:  Hepatitis C Screening: declined  Vision Screening: Recommended annual ophthalmology exams for early detection of glaucoma and other disorders of the eye. Is the patient up to date with their annual eye exam?  Yes  Who is the provider or what is the name of the [facility] the [facility]   Dental Screening: Recommended annual dental exams for proper oral hygiene. Does not go - needs help finding dentist  Community Resource Referral / Chronic Care Management: CRR required this visit?  No   CCM required this visit?  No      Plan:     Assessment & Plan   Annual wellness visit done today including the all of the following: Reviewed patient's Family Medical History Reviewed and updated list of patient's medical providers Assessment of cognitive impairment was done Assessed patient's functional ability Established a written schedule for health screening [place] and Reviewed   I have personally reviewed and noted the following in the patient's chart:   Medical and social history Use of alcohol, tobacco or illicit drugs  Current medications and supplements including opioid prescriptions.  Functional ability and status Nutritional status Physical activity Advanced directives List of other physicians Hospitalizations,  surgeries, and ER visits in previous 12 months Vitals Screenings to include cognitive, depression, and falls Referrals and appointments  In addition, I have reviewed and discussed with patient certain preventive protocols, quality metrics, and best practice recommendations. A written personalized care plan for preventive services as well as general preventive health recommendations were provided to patient.     ACarvel Getting NP   02/05/2022   Nurse Notes:  - Feels her mobility is limited when ambulating long distances - wants to know if she can get a wheelchair to use with going longer distances, such as at an amusement park with her grandchildren.  - CPAP keeps falling off at night and is impacting her sleep. She thinks Dr. CTobie Poetprescribed CPAP - can she get help to get a better fit? - Wants to see a dentist but cannot find an affordable one - office to suggest dentists - Agrees to get shingles vaccines - hasn't had yet due to frequent illness - is well at this time - agrees to go to pharmacy to get - Declines HIV screening - not sexually active since had last HIV test - Discussed advanced directives - Not yet eligible for pneumonia vaccine by age - consider giving earlier due  to smoking hx and COPD   I spent 28 minutes with patient for this annual wellness visit

## 2022-02-05 NOTE — Patient Instructions (Addendum)
[email or web address]  Health Maintenance, Female Adopting a healthy lifestyle and getting preventive care are important in promoting health and wellness. Ask your health care provider about: The right schedule for you to have regular tests and exams. Things you can do on your own to prevent diseases and keep yourself healthy. What should I know about diet, weight, and exercise? Eat a healthy diet  Eat a diet that includes plenty of vegetables, fruits, low-fat dairy products, and lean protein. Do not eat a lot of foods that are high in solid fats, added sugars, or sodium. Maintain a healthy weight Body mass index (BMI) is used to identify weight problems. It estimates body fat based on height and weight. Your health care provider can help determine your BMI and help you achieve or maintain a healthy weight. Get regular exercise Get regular exercise. This is one of the most important things you can do for your health. Most adults should: Exercise for at least 150 minutes each week. The exercise should increase your heart rate and make you sweat (moderate-intensity exercise). Do strengthening exercises at least twice a week. This is in addition to the moderate-intensity exercise. Spend less time sitting. Even light physical activity can be beneficial. Watch cholesterol and blood lipids Have your blood tested for lipids and cholesterol at 62 years of age, then have this test every 5 years. Have your cholesterol levels checked more often if: Your lipid or cholesterol levels are high. You are older than 62 years of age. You are at high risk for heart disease. What should I know about cancer screening? Depending on your health history and family history, you may need to have cancer screening at various ages. This may include screening for: Breast cancer. Cervical cancer. Colorectal cancer. Skin cancer. Lung cancer. What should I know about  heart disease, diabetes, and high blood pressure? Blood pressure and heart disease High blood pressure causes heart disease and increases the risk of stroke. This is more likely to develop in people who have high blood pressure readings or are overweight. Have your blood pressure checked: Every 3-5 years if you are 62-62 years of age. Every year if you are 62 years old or older. Diabetes Have regular diabetes screenings. This checks your fasting blood sugar level. Have the screening done: Once every three years after age 62 if you are at a normal weight and have a low risk for diabetes. More often and at a younger age if you are overweight or have a high risk for diabetes. What should I know about preventing infection? Hepatitis B If you have a higher risk for hepatitis B, you should be screened for this virus. Talk with your health care provider to find out if you are at risk for hepatitis B infection. Hepatitis C Testing is recommended for: Everyone born from [birth year] through 1966. Anyone with known risk factors for hepatitis C. Sexually transmitted infections (STIs) Get screened for STIs, including gonorrhea and chlamydia, if: You are sexually active and are younger than 62 years of age. You are older than 62 years of age and your health care provider tells you that you are at risk for this type of infection. Your sexual activity has changed since you were last screened, and you are at increased risk for chlamydia or gonorrhea. Ask your health care provider if you are at risk. Ask your health care provider about whether you are at high risk for HIV. Your health care provider may recommend a prescription medicine to  help prevent HIV infection. If you choose to take medicine to prevent HIV, you should first get tested for HIV. You should then be tested every 3 months for as long as you are taking the medicine. Pregnancy If you are about to stop having your period (premenopausal) and you may  become pregnant, seek counseling before you get pregnant. Take 400 to 800 micrograms (mcg) of folic acid every day if you become pregnant. Ask for birth control (contraception) if you want to prevent pregnancy. Osteoporosis and menopause Osteoporosis is a disease in which the bones lose minerals and strength with aging. This can result in bone fractures. If you are 62 years old or older, or if you are at risk for osteoporosis and fractures, ask your health care provider if you should: Be screened for bone loss. Take a calcium or vitamin D supplement to lower your risk of fractures. Be given hormone replacement therapy (HRT) to treat symptoms of menopause. Follow these instructions at home: Alcohol use Do not drink alcohol if: Your health care provider tells you not to drink. You are pregnant, may be pregnant, or are planning to become pregnant. If you drink alcohol: Limit how much you have to: 0-1 drink a day. Know how much alcohol is in your drink. In the [place] one drink equals one 12 oz bottle of beer (355 mL), one 5 oz glass of wine (148 mL), or one 1 oz glass of hard liquor (44 mL). Lifestyle Do not use any products that contain nicotine or tobacco. These products include cigarettes, chewing tobacco, and vaping devices, such as e-cigarettes. If you need help quitting, ask your health care provider. Do not use street drugs. Do not share needles. Ask your health care provider for help if you need support or information about quitting drugs. General instructions Schedule regular health, dental, and eye exams. Stay current with your vaccines. Tell your health care provider if: You often feel depressed. You have ever been abused or do not feel safe at home. Summary Adopting a healthy lifestyle and getting preventive care are important in promoting health and wellness. Follow your health care provider's instructions about healthy diet, exercising, and getting tested or screened for  diseases. Follow your health care provider's instructions on monitoring your cholesterol and blood pressure. This information is not intended to replace advice given to you by your health care provider. Make sure you discuss any questions you have with your health care provider. Document Revised: 01/23/2021 Document Reviewed: 01/23/2021 Elsevier Patient Education  [place].

## 2022-02-25 NOTE — Progress Notes (Signed)
No show

## 2022-02-27 ENCOUNTER — Ambulatory Visit (INDEPENDENT_AMBULATORY_CARE_PROVIDER_SITE_OTHER): Payer: Medicare HMO | Admitting: Family Medicine

## 2022-02-27 VITALS — BP 110/60 | HR 72 | Temp 97.1°F | Ht 64.0 in | Wt 210.0 lb

## 2022-02-27 DIAGNOSIS — J302 Other seasonal allergic rhinitis: Secondary | ICD-10-CM | POA: Diagnosis not present

## 2022-02-27 DIAGNOSIS — E782 Mixed hyperlipidemia: Secondary | ICD-10-CM | POA: Diagnosis not present

## 2022-02-27 DIAGNOSIS — E1159 Type 2 diabetes mellitus with other circulatory complications: Secondary | ICD-10-CM

## 2022-02-27 DIAGNOSIS — M5440 Lumbago with sciatica, unspecified side: Secondary | ICD-10-CM

## 2022-02-27 DIAGNOSIS — Z6836 Body mass index (BMI) 36.0-36.9, adult: Secondary | ICD-10-CM

## 2022-02-27 DIAGNOSIS — I152 Hypertension secondary to endocrine disorders: Secondary | ICD-10-CM | POA: Diagnosis not present

## 2022-02-27 DIAGNOSIS — E1142 Type 2 diabetes mellitus with diabetic polyneuropathy: Secondary | ICD-10-CM | POA: Diagnosis not present

## 2022-02-27 MED ORDER — MONTELUKAST SODIUM 10 MG PO TABS
10.0000 mg | ORAL_TABLET | Freq: Every day | ORAL | 3 refills | Status: DC
Start: 2022-02-27 — End: 2022-12-17

## 2022-02-27 MED ORDER — FLUTICASONE PROPIONATE 50 MCG/ACT NA SUSP: 2.0000 | Freq: Every day | NASAL | 3 refills | Status: DC

## 2022-02-27 MED ORDER — OMEPRAZOLE 40 MG PO CPDR: 40.0000 mg | DELAYED_RELEASE_CAPSULE | Freq: Every day | ORAL | 0 refills | Status: DC

## 2022-02-27 NOTE — Progress Notes (Signed)
Subjective:  Patient ID: Brandi Velazquez, female    DOB: 1961-04-18  Age: 61 y.o. MRN: [id number]  Chief Complaint  Patient presents with   Diabetes   COPD   HPI Diabetes:  Complications: neuropathy Glucose checking: Does not check sugars daily. Hypoglycemia: has not had any more since stopped glimeperide.  Most recent A1C: 6.7 Current medications: Metformin 1000 mg take 1 tablet BID, Pioglitizone 30 mg take 1 tablet daily, Farxiga 10 mg take 1 tablet daily, Tresiba inject 30 units daily. Ozempic 2 mg once weekly. ON ramipril 2.5 mg one daily. Patient is losing weight. She is not eating as much. Eating healthy. No exercise.  Last Eye Exam: 03/07/2021 Foot checks: Yes daily.  Hyperlipidemia: Current medications: Pravastatin 40 mg take 1 tablet daily  Chronic Peripheral Edema: Current medications: Spironolactone 25 mg take 1 tablet daily.  Diet: healthy Exercise: walks some.    Patient has been having a chronic cough for approximately 5 months.  She has been treated with 2 antibiotics with no improvement.  She has an albuterol inhaler which she has been using as needed but does not seem to be helping.  Chest x-ray was done and was normal, with subsequent CT scan of her chest normal other than minimal emphysema and some aortic atherosclerosis was normal.  There was no acute processes.   Patient also continues to have dyspnea with exertion. Patient continues to do the Breztri 2 puffs twice daily and the albuterol nebulizers about once a day.  Taking Flonase nasal spray.  Patient continues to have issues with coughing.  She saw pulmonology and she is returning there for respiratory testing.  Current Outpatient Medications on File Prior to Visit  Medication Sig Dispense Refill   albuterol (PROVENTIL) (2.5 MG/3ML) 0.083% nebulizer solution Take 3 mLs (2.5 mg total) by nebulization every 6 (six) hours as needed for wheezing or shortness of breath. 75 mL 12   albuterol (VENTOLIN HFA) 108 (90  Base) MCG/ACT inhaler Inhale 2 puffs into the lungs every 6 (six) hours as needed for wheezing or shortness of breath. 8 g 2   Aspirin 81 MG CAPS Take 81 mg by mouth daily.     Budeson-Glycopyrrol-Formoterol (BREZTRI AEROSPHERE) 160-9-4.8 MCG/ACT AERO Inhale 2 puffs into the lungs 2 (two) times daily. 10.7 g 11   dapagliflozin propanediol (FARXIGA) 10 MG TABS tablet Take 1 tablet (10 mg total) by mouth daily before breakfast. 90 tablet 3   glucose blood ([employer]ION GLUCOSE TEST STRIPS) test strip Use as instructed 100 each 2   insulin degludec (TRESIBA FLEXTOUCH) 200 UNIT/ML FlexTouch Pen Inject 30 Units into the skin daily. 15 mL 0   metFORMIN (GLUCOPHAGE) 1000 MG tablet TAKE ONE TABLET BY MOUTH TWICE DAILY with A meal 180 tablet 1   Multiple Vitamin (MULTIVITAMIN) tablet Take 1 tablet by mouth daily.     pioglitazone (ACTOS) 30 MG tablet Take 1 tablet (30 mg total) by mouth daily. 90 tablet 1   pravastatin (PRAVACHOL) 40 MG tablet TAKE ONE TABLET BY MOUTH EVERYDAY AT BEDTIME 90 tablet 1   ramipril (ALTACE) 2.5 MG capsule TAKE ONE CAPSULE BY MOUTH EVERY MORNING 90 capsule 1   Semaglutide (OZEMPIC, 2 MG/DOSE, [place]) Inject 2 mg into the skin once a week.     spironolactone (ALDACTONE) 25 MG tablet TAKE ONE TABLET BY MOUTH BEFORE BREAKFAST 90 tablet 1   Continuous Blood Gluc Receiver (DEXCOM G6 RECEIVER) DEVI 1 each by Does not apply route in the morning, at noon,  and at bedtime. (Patient not taking: Reported on 02/27/2022) 1 each 3   Continuous Blood Gluc Sensor (DEXCOM G6 SENSOR) MISC Check sugars qac and qhs. (Patient not taking: Reported on 02/27/2022) 9 each 3   Continuous Blood Gluc Transmit (DEXCOM G6 TRANSMITTER) MISC 1 each by Does not apply route every 3 (three) months. (Patient not taking: Reported on 02/27/2022) 1 each 3   Current Facility-Administered Medications on File Prior to Visit  Medication Dose Route Frequency Provider Last Rate Last Admin   ipratropium-albuterol (DUONEB) 0.5-2.5 (3)  MG/3ML nebulizer solution 3 mL  3 mL Nebulization Once Rip Harbour, NP       Past Medical History:  Diagnosis Date   Ankle pain    Chronic pain    back   COPD (chronic obstructive pulmonary disease) (HCC)    Depression    Diabetes mellitus    GERD (gastroesophageal reflux disease)    Hyperlipidemia    Hypertension    Low back pain    Obstructive sleep apnea    Pneumonia 2010   Post-menopausal atrophic vaginitis    Superficial phlebitis    Past Surgical History:  Procedure Laterality Date   BACK SURGERY  1990   CESAREAN SECTION     CYST REMOVAL HAND     inner thigh   SPINE SURGERY     lumbar laminectomy and diskectomy    Family History  Problem Relation Age of Onset   Diabetes Mother    Heart disease Mother    Cancer Father    Diabetes Sister    Stroke Sister    Lung disease Neg Hx    Social History   Socioeconomic History   Marital status: Single    Spouse name: Not on file   Number of children: Not on file   Years of education: Not on file   Highest education level: Not on file  Occupational History   Not on file  Tobacco Use   Smoking status: Former    Packs/day: 1.50    Years: 15.00    Total pack years: 22.50    Types: Cigarettes    Quit date: 03/20/2009    Years since quitting: 12.9   Smokeless tobacco: Never  Vaping Use   Vaping Use: Never used  Substance and Sexual Activity   Alcohol use: No   Drug use: No   Sexual activity: Not on file  Other Topics Concern   Not on file  Social History Narrative   Not on file   Social Determinants of Health   Financial Resource Strain: Not on file  Food Insecurity: No Food Insecurity (02/05/2022)   Hunger Vital Sign    Worried About Running Out of Food in the Last Year: Never true    Ran Out of Food in the Last Year: Never true  Transportation Needs: No Transportation Needs (02/05/2022)   PRAPARE - Hydrologist (Medical): No    Lack of Transportation (Non-Medical): No   Physical Activity: Inactive (02/05/2022)   Exercise Vital Sign    Days of Exercise per Week: 0 days    Minutes of Exercise per Session: 0 min  Stress: Not on file  Social Connections: Unknown (02/05/2022)   Social Connection and Isolation Panel [NHANES]    Frequency of Communication with Friends and Family: More than three times a week    Frequency of Social Gatherings with Friends and Family: More than three times a week    Attends Religious Services:  1 to 4 times per year    Active Member of Clubs or Organizations: No    Attends Archivist Meetings: Never    Marital Status: Not on file    Review of Systems  Constitutional:  Positive for fatigue. Negative for chills and fever.  HENT:  Positive for congestion, ear pain and sore throat. Negative for rhinorrhea.   Respiratory:  Positive for cough, shortness of breath and wheezing.   Cardiovascular:  Negative for chest pain.  Gastrointestinal:  Positive for nausea and vomiting (posttussive emesis.). Negative for abdominal pain, constipation and diarrhea.  Endocrine: Negative for polydipsia, polyphagia and polyuria.  Genitourinary:  Negative for dysuria and urgency.  Musculoskeletal:  Positive for arthralgias, back pain and myalgias.  Neurological:  Negative for dizziness, weakness, light-headedness and headaches.  Psychiatric/Behavioral:  Negative for dysphoric mood. The patient is not nervous/anxious.      Objective:  BP 110/60   Pulse 72   Temp (!) 97.1 F (36.2 C)   Ht '5\' 4"'$  (1.626 m)   Wt 210 lb (95.3 kg)   BMI 36.05 kg/m      02/27/2022    9:30 AM 01/18/2022   10:25 AM 12/21/2021    2:59 PM  BP/Weight  Systolic BP 659 935 701  Diastolic BP 60 74 64  Wt. (Lbs) 210 222.6 223  BMI 36.05 kg/m2 37.91 kg/m2 38.28 kg/m2    Physical Exam Vitals reviewed.  Constitutional:      Appearance: Normal appearance. She is obese.  HENT:     Right Ear: Tympanic membrane, ear canal and external ear normal.     Left Ear:  Tympanic membrane, ear canal and external ear normal.     Nose: Rhinorrhea present.     Mouth/Throat:     Pharynx: Oropharynx is clear. No oropharyngeal exudate or posterior oropharyngeal erythema.  Neck:     Vascular: No carotid bruit.  Cardiovascular:     Rate and Rhythm: Normal rate and regular rhythm.     Heart sounds: Normal heart sounds. No murmur heard. Pulmonary:     Effort: Pulmonary effort is normal. No respiratory distress.     Breath sounds: Wheezing and rhonchi present.  Abdominal:     Palpations: Abdomen is soft.     Tenderness: There is no abdominal tenderness.  Lymphadenopathy:     Cervical: No cervical adenopathy.  Skin:    Findings: Rash (stasis dermatitis) present.  Neurological:     Mental Status: She is alert and oriented to person, place, and time.  Psychiatric:        Mood and Affect: Mood normal.        Behavior: Behavior normal.     Diabetic Foot Exam - Simple   No data filed      Lab Results  Component Value Date   WBC 14.0 (H) 02/27/2022   HGB 15.4 02/27/2022   HCT 46.2 02/27/2022   PLT 233 02/27/2022   GLUCOSE 105 (H) 02/27/2022   CHOL 125 02/27/2022   TRIG 69 02/27/2022   HDL 58 02/27/2022   LDLCALC 53 02/27/2022   ALT 12 02/27/2022   AST 11 02/27/2022   NA 143 02/27/2022   K 3.6 02/27/2022   CL 104 02/27/2022   CREATININE 0.71 02/27/2022   BUN 24 02/27/2022   CO2 23 02/27/2022   TSH 1.500 11/22/2021   HGBA1C 6.7 (H) 02/27/2022   MICROALBUR 30 05/18/2021      Assessment & Plan:   Problem List Items Addressed  This Visit       Cardiovascular and Mediastinum   Hypertension associated with diabetes ([place]) - Primary    Well controlled.  No changes to medicines. On ramipril. Continue to work on eating a healthy diet and exercise.  Labs drawn today.        Relevant Orders   Comprehensive metabolic panel (Completed)   CBC with Differential/Platelet (Completed)     Respiratory   Seasonal allergic rhinitis   Relevant  Medications   montelukast (SINGULAIR) 10 MG tablet   fluticasone (FLONASE) 50 MCG/ACT nasal spray     Endocrine   Diabetic polyneuropathy associated with type 2 diabetes mellitus (HCC)    Control: good Recommend check feet daily. Recommend annual eye exams. Medicines: Metformin 1000 mg take 1 tablet BID, Pioglitizone 30 mg take 1 tablet daily, Farxiga 10 mg take 1 tablet daily, Tresiba inject 30 units daily. Ozempic 2 mg once weekly. ON ramipril 2.5 mg one daily. Continue to work on eating a healthy diet and exercise.  Labs drawn today.         Relevant Orders   Hemoglobin A1c (Completed)     Nervous and Auditory   Back pain of lumbar region with sciatica     Other   Hyperlipidemia    Well controlled.  No changes to medicines. Continue pravastatin 40 mg before bed.  Continue to work on eating a healthy diet and exercise.  Labs drawn today.        Relevant Orders   Lipid panel (Completed)   Severe obesity with body mass index (BMI) of 36.0 to 36.9 with serious comorbidity ([place])    Recommend continue to work on eating healthy diet and exercise. Encouraged continued weight loss.      .  Meds ordered this encounter  Medications   omeprazole (PRILOSEC) 40 MG capsule    Sig: Take 1 capsule (40 mg total) by mouth daily.    Dispense:  90 capsule    Refill:  0   montelukast (SINGULAIR) 10 MG tablet    Sig: Take 1 tablet (10 mg total) by mouth at bedtime.    Dispense:  90 tablet    Refill:  3   fluticasone (FLONASE) 50 MCG/ACT nasal spray    Sig: Place 2 sprays into both nostrils daily.    Dispense:  48 g    Refill:  3    90 day/3 rfs.    Orders Placed This Encounter  Procedures   Lipid panel   Hemoglobin A1c   Comprehensive metabolic panel   CBC with Differential/Platelet     Follow-up: No follow-ups on file.  An After Visit Summary was printed and given to the patient.  [name], MD [facility] [phone]

## 2022-02-28 LAB — COMPREHENSIVE METABOLIC PANEL
ALT: 12 IU/L (ref 0–32)
AST: 11 IU/L (ref 0–40)
Albumin/Globulin Ratio: 3.3 — ABNORMAL HIGH (ref 1.2–2.2)
Albumin: 4.6 g/dL (ref 3.8–4.8)
Alkaline Phosphatase: 61 IU/L (ref 44–121)
BUN/Creatinine Ratio: 34 — ABNORMAL HIGH (ref 12–28)
BUN: 24 mg/dL (ref 8–27)
Bilirubin Total: 0.4 mg/dL (ref 0.0–1.2)
CO2: 23 mmol/L (ref 20–29)
Calcium: 9.9 mg/dL (ref 8.7–10.3)
Chloride: 104 mmol/L (ref 96–106)
Creatinine, Ser: 0.71 mg/dL (ref 0.57–1.00)
Globulin, Total: 1.4 g/dL — ABNORMAL LOW (ref 1.5–4.5)
Glucose: 105 mg/dL — ABNORMAL HIGH (ref 70–99)
Potassium: 3.6 mmol/L (ref 3.5–5.2)
Sodium: 143 mmol/L (ref 134–144)
Total Protein: 6 g/dL (ref 6.0–8.5)
eGFR: 97 mL/min/{1.73_m2} (ref >59)

## 2022-02-28 LAB — CBC WITH DIFFERENTIAL/PLATELET
Basophils Absolute: 0 10*3/uL (ref 0.0–0.2)
Basos: 0 %
EOS (ABSOLUTE): 0.1 10*3/uL (ref 0.0–0.4)
Eos: 1 %
Hematocrit: 46.2 % (ref 34.0–46.6)
Hemoglobin: 15.4 g/dL (ref 11.1–15.9)
Immature Grans (Abs): 0 10*3/uL (ref 0.0–0.1)
Immature Granulocytes: 0 %
Lymphocytes Absolute: 2.2 10*3/uL (ref 0.7–3.1)
Lymphs: 16 %
MCH: 29 pg (ref 26.6–33.0)
MCHC: 33.3 g/dL (ref 31.5–35.7)
MCV: 87 fL (ref 79–97)
Monocytes Absolute: 1 10*3/uL — ABNORMAL HIGH (ref 0.1–0.9)
Monocytes: 7 %
Neutrophils Absolute: 10.5 10*3/uL — ABNORMAL HIGH (ref 1.4–7.0)
Neutrophils: 76 %
Platelets: 233 10*3/uL (ref 150–450)
RBC: 5.31 x10E6/uL — ABNORMAL HIGH (ref 3.77–5.28)
RDW: 13.4 % (ref 11.7–15.4)
WBC: 14 10*3/uL — ABNORMAL HIGH (ref 3.4–10.8)

## 2022-02-28 LAB — LIPID PANEL
Chol/HDL Ratio: 2.2 ratio (ref 0.0–4.4)
Cholesterol, Total: 125 mg/dL (ref 100–199)
HDL: 58 mg/dL (ref >39)
LDL Chol Calc (NIH): 53 mg/dL (ref 0–99)
Triglycerides: 69 mg/dL (ref 0–149)
VLDL Cholesterol Cal: 14 mg/dL (ref 5–40)

## 2022-02-28 LAB — HEMOGLOBIN A1C
Est. average glucose Bld gHb Est-mCnc: 146 mg/dL
Hgb A1c MFr Bld: 6.7 % — ABNORMAL HIGH (ref 4.8–5.6)

## 2022-02-28 NOTE — Progress Notes (Signed)
Blood count abnormal. Wbc elevated. Please ask if has had steroids recently? Liver function normal.  Kidney function normal.  Cholesterol: good HBA1C: 6.7.

## 2022-03-02 ENCOUNTER — Telehealth: Payer: Self-pay

## 2022-03-02 MED ORDER — AMOXICILLIN 875 MG PO TABS: 875.0000 mg | ORAL_TABLET | Freq: Two times a day (BID) | ORAL | 0 refills | Status: DC

## 2022-03-02 NOTE — Telephone Encounter (Signed)
[facility] called to report that both ears are hurting.  Dr. Tobie Poet sent RX to pharmacy.

## 2022-03-02 NOTE — Chronic Care Management (AMB) (Signed)
[employer] patient assistance program notification:  120- day supply of Novofine needles and Tresiba 200 u/ml will be filled on 03/18/2022 and  should arrive to the office in 10-14 business days. Patient enrollment will expire on 08/16/2022.  Pattricia Boss, [place] Pharmacist Assistant [phone]

## 2022-03-03 ENCOUNTER — Encounter: Payer: Self-pay | Admitting: Family Medicine

## 2022-03-03 DIAGNOSIS — J302 Other seasonal allergic rhinitis: Secondary | ICD-10-CM | POA: Insufficient documentation

## 2022-03-03 NOTE — Assessment & Plan Note (Signed)
Control: good Recommend check feet daily. Recommend annual eye exams. Medicines: Metformin 1000 mg take 1 tablet BID, Pioglitizone 30 mg take 1 tablet daily, Farxiga 10 mg take 1 tablet daily, Tresiba inject 30 units daily. Ozempic 2 mg once weekly. ON ramipril 2.5 mg one daily. Continue to work on eating a healthy diet and exercise.  Labs drawn today.

## 2022-03-03 NOTE — Assessment & Plan Note (Signed)
Recommend continue to work on eating healthy diet and exercise. Encouraged continued weight loss.

## 2022-03-03 NOTE — Assessment & Plan Note (Signed)
Well controlled.  No changes to medicines. Continue pravastatin 40 mg before bed.  Continue to work on eating a healthy diet and exercise.  Labs drawn today.

## 2022-03-03 NOTE — Assessment & Plan Note (Signed)
Well controlled.  No changes to medicines. On ramipril. Continue to work on eating a healthy diet and exercise.  Labs drawn today.

## 2022-03-05 ENCOUNTER — Ambulatory Visit (INDEPENDENT_AMBULATORY_CARE_PROVIDER_SITE_OTHER): Payer: Medicare HMO

## 2022-03-05 DIAGNOSIS — E782 Mixed hyperlipidemia: Secondary | ICD-10-CM

## 2022-03-05 DIAGNOSIS — E1142 Type 2 diabetes mellitus with diabetic polyneuropathy: Secondary | ICD-10-CM

## 2022-03-05 DIAGNOSIS — E1159 Type 2 diabetes mellitus with other circulatory complications: Secondary | ICD-10-CM

## 2022-03-05 NOTE — Patient Instructions (Signed)
Visit Information   Goals Addressed   None    Patient Care Plan: CCM Pharmacy Care Plan     Problem Identified: htn, hld, dm   Priority: High  Onset Date: 12/07/2020     Long-Range Goal: Disease Management   Start Date: 12/07/2020  Expected End Date: 12/08/2021  Recent Progress: On track  Priority: High  Note:   Current Barriers:  Unable to independently afford treatment regimen Unable to achieve control of blood sugar    Pharmacist Clinical Goal(s):  Patient will verbalize ability to afford treatment regimen achieve control of diabetes as evidenced by blood sugar through collaboration with PharmD and provider.   Interventions: 1:1 collaboration with [name], Brandi Maxwell, MD regarding development and update of comprehensive plan of care as evidenced by provider attestation and co-signature Inter-disciplinary care team collaboration (see longitudinal plan of care) Comprehensive medication review performed; medication list updated in electronic medical record  Hypertension (BP goal <130/80) BP Readings from Last 3 Encounters:  05/18/21 124/70  04/20/21 101/68  02/09/21 112/64   Lab Results  Component Value Date   K 3.6 02/27/2022  -Controlled -Current treatment: ramipril 2.5 mg daily Appropriate, Effective, Safe, Accessible Spironolactone 25 mg daily Appropriate, Effective, Safe, Accessible -Medications previously tried: none reported  -Current home readings: well controlled per patient  -Current dietary habits: has had very little appetite since recovering from pneumonia -Current exercise habits: limited due to mobility -Denies hypotensive/hypertensive symptoms -Educated on BP goals and benefits of medications for prevention of heart attack, stroke and kidney damage; Daily salt intake goal < 2300 mg; Exercise goal of 150 minutes per week; -Counseled to monitor BP at home weekly, document, and provide log at future appointments -Counseled on diet and exercise extensively  and encouraged to resume PT.  Recommended to continue current medication  Hyperlipidemia: (LDL goal < 70) Lab Results  Component Value Date   CHOL 134 05/18/2021   CHOL 120 02/09/2021   CHOL 159 10/27/2020   Lab Results  Component Value Date   HDL 46 05/18/2021   HDL 47 02/09/2021   HDL 63 10/27/2020   Lab Results  Component Value Date   LDLCALC 72 05/18/2021   LDLCALC 58 02/09/2021   LDLCALC 82 10/27/2020   Lab Results  Component Value Date   TRIG 80 05/18/2021   TRIG 75 02/09/2021   TRIG 75 10/27/2020   Lab Results  Component Value Date   CHOLHDL 2.9 05/18/2021   CHOLHDL 2.6 02/09/2021   CHOLHDL 2.5 10/27/2020  No results found for: LDLDIRECT -Controlled -Current treatment: pravastatin 40 mg at bedtime Appropriate, Effective, Safe, Accessible -Medications previously tried: none reported  -Current dietary patterns: limited appetite lately since pneumonia -Current exercise habits: limited due to mobility -Educated on Cholesterol goals;  Benefits of statin for ASCVD risk reduction; Importance of limiting foods high in cholesterol; Exercise goal of 150 minutes per week; -Counseled on diet and exercise extensively Recommended to continue current medication and consider change in statin after next blood work if LDL above goal.   Diabetes (A1c goal <7%) Lab Results  Component Value Date   HGBA1C 6.9 (H) 05/18/2021   HGBA1C 8.6 (H) 02/09/2021   HGBA1C 8.0 (H) 10/27/2020   Lab Results  Component Value Date   MICROALBUR 30 05/18/2021   LDLCALC 72 05/18/2021   CREATININE 0.68 05/18/2021   Lab Results  Component Value Date   NA 141 05/18/2021   K 4.8 05/18/2021   CREATININE 0.68 05/18/2021   GFRNONAA 91 10/27/2020  GFRAA 105 10/27/2020   GLUCOSE 97 05/18/2021   Lab Results  Component Value Date   WBC 9.8 05/18/2021   HGB 15.3 05/18/2021   HCT 46.3 05/18/2021   MCV 87 05/18/2021   PLT 251 05/18/2021  -Controlled -Current medications: Ozmepic '2mg'$   weekly Appropriate, Effective, Safe, Accessible Tresiba 30 units Appropriate, Effective, Safe, Accessible Actos 30 mg daily Appropriate, Effective, Safe, Accessible [employer] testing supplies  Metformin 1000 mg bid Appropriate, Effective, Safe, Accessible Farxiga 5 mg daily Appropriate, Effective, Safe, Accessible -Medications previously tried: 70/30 insulin   -Current home glucose readings fasting glucose: unable to check currently  -Denies hypoglycemic/hyperglycemic symptoms -Current meal patterns:  Patient doesn't snack. Eats 2-3 meals daily.  -Current exercise: limited due to mobility - seeing vein specialist soon -Educated on A1c and blood sugar goals; Complications of diabetes including kidney damage, retinal damage, and cardiovascular disease; Exercise goal of 150 minutes per week; June 2023: Sugars at goal!   Patient Goals/Self-Care Activities Patient will:  - take medications as prescribed focus on medication adherence by using pill box check glucose daily, document, and provide at future appointments target a minimum of 150 minutes of moderate intensity exercise weekly engage in dietary modifications by limiting carbohydrates and balancing meals using plate method.   Follow Up Plan: Telephone follow up appointment with care management team member scheduled for: November 2023  Brandi Velazquez, [place].D. - [phone]      Brandi Velazquez was given information about Chronic Care Management services today including:  CCM service includes personalized support from designated clinical staff supervised by her physician, including individualized plan of care and coordination with other care providers 24/7 contact phone numbers for assistance for urgent and routine care needs. Standard insurance, coinsurance, copays and deductibles apply for chronic care management only during months in which we provide at least 20 minutes of these services. Most insurances cover these services at  100%, however patients may be responsible for any copay, coinsurance and/or deductible if applicable. This service may help you avoid the need for more expensive face-to-face services. Only one practitioner may furnish and bill the service in a calendar month. The patient may stop CCM services at any time (effective at the end of the month) by phone call to the office staff.  Patient agreed to services and verbal consent obtained.   The patient verbalized understanding of instructions, educational materials, and care plan provided today and DECLINED offer to receive copy of patient instructions, educational materials, and care plan.  The pharmacy team will reach out to the patient again over the next 60 days.   Brandi Velazquez, [place]

## 2022-03-05 NOTE — Progress Notes (Cosign Needed)
Chronic Care Management Pharmacy Note  03/05/2022 Name:  Brandi Velazquez MRN:  [id number] DOB:  14-Apr-1961    Summary:  Pleasant 61 year old female presents for f/u CCM visit. Her father was in the [place] so she's travelled all across the country but [place] is her favourite. She has 1 daughter and 13 year old twin grandkids. They all live with her. Patient works part-time with mentally handicapped during the week and the daughter works 12 hours at the hospital on the weekend  Plan Recommendations:  Unable to afford Judithann Sauger, will start PAP   Subjective: Brandi Velazquez is an 61 y.o. year old female who is a primary patient of [name], Kirsten, MD.  The CCM team was consulted for assistance with disease management and care coordination needs.    Engaged with patient by telephone for follow up visit in response to provider referral for pharmacy case management and/or care coordination services.   Consent to Services:  The patient was given information about Chronic Care Management services, agreed to services, and gave verbal consent prior to initiation of services.  Please see initial visit note for detailed documentation.   Patient Care Team: [name], MD as PCP - General (Family Medicine) Lane Hacker, [facility] (Pharmacist)  Recent office visits: 11/21/2020 - cough - azithromycin, benzonotate and duoneb.  10/27/2020 -  Continue current medications.   Recent consult visits: None since last visit  Hospital visits: None in previous 6 months  Objective:  Lab Results  Component Value Date   CREATININE 0.71 02/27/2022   BUN 24 02/27/2022   GFRNONAA 91 10/27/2020   GFRAA 105 10/27/2020   NA 143 02/27/2022   K 3.6 02/27/2022   CALCIUM 9.9 02/27/2022   CO2 23 02/27/2022   GLUCOSE 105 (H) 02/27/2022    Lab Results  Component Value Date/Time   HGBA1C 6.7 (H) 02/27/2022 10:41 AM   HGBA1C 6.7 (H) 11/22/2021 10:58 AM   MICROALBUR 30 05/18/2021 10:31 AM   MICROALBUR 30 07/24/2020  11:45 PM    Last diabetic Eye exam:  Lab Results  Component Value Date/Time   HMDIABEYEEXA No Retinopathy 03/07/2021 12:00 AM    Last diabetic Foot exam: No results found for: "HMDIABFOOTEX"   Lab Results  Component Value Date   CHOL 125 02/27/2022   HDL 58 02/27/2022   LDLCALC 53 02/27/2022   TRIG 69 02/27/2022   CHOLHDL 2.2 02/27/2022       Latest Ref Rng & Units 02/27/2022   10:41 AM 11/22/2021   10:58 AM 08/22/2021   11:26 AM  Hepatic Function  Total Protein 6.0 - 8.5 g/dL 6.0  7.0  6.7   Albumin 3.8 - 4.8 g/dL 4.6  4.6  4.3   AST 0 - 40 IU/L 11  13  12    ALT 0 - 32 IU/L 12  20  15    Alk Phosphatase 44 - 121 IU/L 61  55  55   Total Bilirubin 0.0 - 1.2 mg/dL 0.4  0.4  0.4     Lab Results  Component Value Date/Time   TSH 1.500 11/22/2021 10:58 AM       Latest Ref Rng & Units 02/27/2022   10:41 AM 11/22/2021   10:58 AM 08/22/2021   11:26 AM  CBC  WBC 3.4 - 10.8 x10E3/uL 14.0  11.5  9.7   Hemoglobin 11.1 - 15.9 g/dL 15.4  15.3  14.9   Hematocrit 34.0 - 46.6 % 46.2  45.7  45.1  Platelets 150 - 450 x10E3/uL 233  252  218     No results found for: "VD25OH"  Clinical ASCVD: No  The ASCVD Risk score (Arnett DK, et al., 2019) failed to calculate for the following reasons:   The valid total cholesterol range is 130 to 320 mg/dL       02/05/2022    3:16 PM 07/18/2021   11:21 AM 07/18/2020   10:09 AM  Depression screen PHQ 2/9  Decreased Interest 0 0 0  Down, Depressed, Hopeless 0 0 0  PHQ - 2 Score 0 0 0     Social History   Tobacco Use  Smoking Status Former   Packs/day: 1.50   Years: 15.00   Total pack years: 22.50   Types: Cigarettes   Quit date: 03/20/2009   Years since quitting: 12.9  Smokeless Tobacco Never   BP Readings from Last 3 Encounters:  02/27/22 110/60  01/18/22 130/74  12/21/21 112/64   Pulse Readings from Last 3 Encounters:  02/27/22 72  01/18/22 70  12/21/21 85   Wt Readings from Last 3 Encounters:  02/27/22 210 lb (95.3 kg)   01/18/22 222 lb 9.6 oz (101 kg)  12/21/21 223 lb (101.2 kg)   BMI Readings from Last 3 Encounters:  02/27/22 36.05 kg/m  01/18/22 37.91 kg/m  12/21/21 38.28 kg/m    Assessment/Interventions: Review of patient past medical history, allergies, medications, health status, including review of consultants reports, laboratory and other test data, was performed as part of comprehensive evaluation and provision of chronic care management services.   SDOH:  (Social Determinants of Health) assessments and interventions performed: Yes SDOH Interventions    Flowsheet Row Most Recent Value  SDOH Interventions   Financial Strain Interventions Other (Comment)  [PAP (See CP)]  Transportation Interventions Intervention Not Indicated       CCM Care Plan  Allergies  Allergen Reactions   Bactrim [Sulfamethoxazole-Trimethoprim] Swelling   Morphine And Related     Medications Reviewed Today     Reviewed by [name], MD (Physician) on 03/03/22 at 1623  Med List Status: <None>   Medication Order Taking? Sig Documenting Provider Last Dose Status Informant  albuterol (PROVENTIL) (2.5 MG/3ML) 0.083% nebulizer solution [id number] Yes Take 3 mLs (2.5 mg total) by nebulization every 6 (six) hours as needed for wheezing or shortness of breath. Spero Geralds, MD Taking Active   albuterol (VENTOLIN HFA) 108 [phone] Base) MCG/ACT inhaler [id number] Yes Inhale 2 puffs into the lungs every 6 (six) hours as needed for wheezing or shortness of breath. [name], Kirsten, MD Taking Active   amoxicillin (AMOXIL) 875 MG tablet [id number]  Take 1 tablet (875 mg total) by mouth 2 (two) times daily. [name], MD  Active   Aspirin 81 MG CAPS [id number] Yes Take 81 mg by mouth daily. [provider] Taking Active   Budeson-Glycopyrrol-Formoterol (BREZTRI AEROSPHERE) 160-9-4.8 MCG/ACT AERO [id number] Yes Inhale 2 puffs into the lungs 2 (two) times daily. Rip Harbour, NP Taking Active   Continuous Blood Gluc  Receiver ([place]) DEVI [id number] No 1 each by Does not apply route in the morning, at noon, and at bedtime.  Patient not taking: Reported on 02/27/2022   CoxElnita Maxwell, MD Not Taking Active   Continuous Blood Gluc Sensor (DEXCOM G6 SENSOR) MISC [id number] No Check sugars qac and qhs.  Patient not taking: Reported on 02/27/2022   CoxElnita Maxwell, MD Not Taking Active   Continuous Blood Gluc Transmit (DEXCOM G6 TRANSMITTER) MISC  [id number] No 1 each by Does not apply route every 3 (three) months.  Patient not taking: Reported on 02/27/2022   CoxElnita Maxwell, MD Not Taking Active   dapagliflozin propanediol (FARXIGA) 10 MG TABS tablet [id number] Yes Take 1 tablet (10 mg total) by mouth daily before breakfast. [name], Kirsten, MD Taking Active   fluticasone [facility]) 50 MCG/ACT nasal spray [id number]  Place 2 sprays into both nostrils daily. [name], Kirsten, MD  Active   glucose blood (RELION GLUCOSE TEST STRIPS) test strip [id number] Yes Use as instructed [name], Kirsten, MD Taking Active   insulin degludec (TRESIBA FLEXTOUCH) 200 UNIT/ML FlexTouch Pen [id number] Yes Inject 30 Units into the skin daily. [name], Kirsten, MD Taking Active            Med Note [place], [place] May 23, 2021 11:10 AM) Per Dr. Alyse Low recommendation patient is to decrease to 20 units daily when beginning Ozempic 2 mg weekly  ipratropium-albuterol (DUONEB) 0.5-2.5 (3) MG/3ML nebulizer solution 3 mL [id number]   Rip Harbour, NP  Active   metFORMIN (GLUCOPHAGE) 1000 MG tablet [id number] Yes TAKE ONE TABLET BY MOUTH TWICE DAILY with A meal Rip Harbour, NP Taking Active   montelukast (SINGULAIR) 10 MG tablet [id number] Yes Take 1 tablet (10 mg total) by mouth at bedtime. [name], Kirsten, MD  Active   Multiple Vitamin (MULTIVITAMIN) tablet [id number] Yes Take 1 tablet by mouth daily. [provider] Taking Active   omeprazole (PRILOSEC) 40 MG capsule [id number] Yes Take 1 capsule (40 mg total) by mouth daily. [name], Kirsten, MD   Active   pioglitazone (ACTOS) 30 MG tablet [id number] Yes Take 1 tablet (30 mg total) by mouth daily. [name], Kirsten, MD Taking Active   pravastatin (PRAVACHOL) 40 MG tablet [id number] Yes TAKE ONE TABLET BY MOUTH EVERYDAY AT BEDTIME [name], Kirsten, MD Taking Active   ramipril (ALTACE) 2.5 MG capsule [id number] Yes TAKE ONE CAPSULE BY MOUTH EVERY MORNING [name], Kirsten, MD Taking Active   Semaglutide [facility], 2 MG/DOSE, [place]) [id number] Yes Inject 2 mg into the skin once a week. [provider] Taking Active   spironolactone (ALDACTONE) 25 MG tablet [id number] Yes TAKE ONE TABLET BY MOUTH BEFORE Guy Sandifer, MD Taking Active             Patient Active Problem List   Diagnosis Date Noted   Seasonal allergic rhinitis 03/03/2022   COPD (chronic obstructive pulmonary disease) ([place]) 02/05/2022   Bilateral wheezing 11/22/2021   Abnormally low peak expiratory flow rate 11/22/2021   Dyspnea on exertion 10/09/2021   Diabetic polyneuropathy associated with type 2 diabetes mellitus ([place]) 05/23/2021   Achilles tendon disorder, right 05/23/2021   Severe obesity with body mass index (BMI) of 36.0 to 36.9 with serious comorbidity ([place]) 10/30/2020   Hyperlipidemia    Hypertension associated with diabetes ([place])    GERD (gastroesophageal reflux disease)    Chronic midline low back pain without sciatica 01/10/2020   Back pain of lumbar region with sciatica 01/07/2020   Cough 10/29/2019   Varicose veins of lower extremities with other complications 41/28/7867   Varicose veins of lower extremities with inflammation 06/14/2011    Immunization History  Administered Date(s) Administered   Influenza Inj Mdck Quad Pf 07/18/2020, 05/18/2021   Moderna Covid-19 Vaccine Bivalent Booster 75yr & up 08/22/2021   Moderna SARS-COV2 Booster Vaccination 02/09/2021   Moderna Sars-Covid-2 Vaccination 10/07/2019, 11/02/2019   Pneumococcal Polysaccharide-23 10/09/2011   Tdap 05/15/2018    Conditions to be  addressed/monitored:  Hyperlipidemia and Diabetes  Care Plan : [place]  Updates made by Lane Hacker, RPH since 03/05/2022 12:00 AM     Problem: htn, hld, dm   Priority: High  Onset Date: 12/07/2020     Long-Range Goal: Disease Management   Start Date: 12/07/2020  Expected End Date: 12/08/2021  Recent Progress: On track  Priority: High  Note:   Current Barriers:  Unable to independently afford treatment regimen Unable to achieve control of blood sugar    Pharmacist Clinical Goal(s):  Patient will verbalize ability to afford treatment regimen achieve control of diabetes as evidenced by blood sugar through collaboration with PharmD and provider.   Interventions: 1:1 collaboration with [name], [name], MD regarding development and update of comprehensive plan of care as evidenced by provider attestation and co-signature Inter-disciplinary care team collaboration (see longitudinal plan of care) Comprehensive medication review performed; medication list updated in electronic medical record  Hypertension (BP goal <130/80) BP Readings from Last 3 Encounters:  05/18/21 124/70  04/20/21 101/68  02/09/21 112/64   Lab Results  Component Value Date   K 3.6 02/27/2022  -Controlled -Current treatment: ramipril 2.5 mg daily Appropriate, Effective, Safe, Accessible Spironolactone 25 mg daily Appropriate, Effective, Safe, Accessible -Medications previously tried: none reported  -Current home readings: well controlled per patient  -Current dietary habits: has had very little appetite since recovering from pneumonia -Current exercise habits: limited due to mobility -Denies hypotensive/hypertensive symptoms -Educated on BP goals and benefits of medications for prevention of heart attack, stroke and kidney damage; Daily salt intake goal < 2300 mg; Exercise goal of 150 minutes per week; -Counseled to monitor BP at home weekly, document, and provide log at future  appointments -Counseled on diet and exercise extensively and encouraged to resume PT.  Recommended to continue current medication  Hyperlipidemia: (LDL goal < 70) Lab Results  Component Value Date   CHOL 134 05/18/2021   CHOL 120 02/09/2021   CHOL 159 10/27/2020   Lab Results  Component Value Date   HDL 46 05/18/2021   HDL 47 02/09/2021   HDL 63 10/27/2020   Lab Results  Component Value Date   LDLCALC 72 05/18/2021   LDLCALC 58 02/09/2021   LDLCALC 82 10/27/2020   Lab Results  Component Value Date   TRIG 80 05/18/2021   TRIG 75 02/09/2021   TRIG 75 10/27/2020   Lab Results  Component Value Date   CHOLHDL 2.9 05/18/2021   CHOLHDL 2.6 02/09/2021   CHOLHDL 2.5 10/27/2020  No results found for: LDLDIRECT -Controlled -Current treatment: pravastatin 40 mg at bedtime Appropriate, Effective, Safe, Accessible -Medications previously tried: none reported  -Current dietary patterns: limited appetite lately since pneumonia -Current exercise habits: limited due to mobility -Educated on Cholesterol goals;  Benefits of statin for ASCVD risk reduction; Importance of limiting foods high in cholesterol; Exercise goal of 150 minutes per week; -Counseled on diet and exercise extensively Recommended to continue current medication and consider change in statin after next blood work if LDL above goal.   Diabetes (A1c goal <7%) Lab Results  Component Value Date   HGBA1C 6.9 (H) 05/18/2021   HGBA1C 8.6 (H) 02/09/2021   HGBA1C 8.0 (H) 10/27/2020   Lab Results  Component Value Date   MICROALBUR 30 05/18/2021   LDLCALC 72 05/18/2021   CREATININE 0.68 05/18/2021   Lab Results  Component Value Date   NA 141 05/18/2021   K 4.8 05/18/2021   CREATININE 0.68 05/18/2021  GFRNONAA 91 10/27/2020   GFRAA 105 10/27/2020   GLUCOSE 97 05/18/2021   Lab Results  Component Value Date   WBC 9.8 05/18/2021   HGB 15.3 05/18/2021   HCT 46.3 05/18/2021   MCV 87 05/18/2021   PLT 251  05/18/2021  -Controlled -Current medications: Ozmepic 31m weekly Appropriate, Effective, Safe, Accessible Tresiba 30 units Appropriate, Effective, Safe, Accessible Actos 30 mg daily Appropriate, Effective, Safe, Accessible [employer] testing supplies  Metformin 1000 mg bid Appropriate, Effective, Safe, Accessible Farxiga 5 mg daily Appropriate, Effective, Safe, Accessible -Medications previously tried: 70/30 insulin   -Current home glucose readings fasting glucose: unable to check currently  -Denies hypoglycemic/hyperglycemic symptoms -Current meal patterns:  Patient doesn't snack. Eats 2-3 meals daily.  -Current exercise: limited due to mobility - seeing vein specialist soon -Educated on A1c and blood sugar goals; Complications of diabetes including kidney damage, retinal damage, and cardiovascular disease; Exercise goal of 150 minutes per week; June 2023: Sugars at goal!   Patient Goals/Self-Care Activities Patient will:  - take medications as prescribed focus on medication adherence by using pill box check glucose daily, document, and provide at future appointments target a minimum of 150 minutes of moderate intensity exercise weekly engage in dietary modifications by limiting carbohydrates and balancing meals using plate method.   Follow Up Plan: Telephone follow up appointment with care management team member scheduled for: November 2023  NArizona Constable PFloridaD. - [phone]       Medication Assistance:  TTyler Aasand Ozempicobtained through NEast[employer] Chemicalmedication assistance program.  Enrollment ends 08/16/2022  Patient's preferred pharmacy is:  [facility] [facility] [address] [address]Phone: [phone]: [phone] [facility] [place]- [address] [place]Phone: [phone]: [address]Phone: [phone]: [phone] [facility](New Address) - [place] [address] Previously: [address] [place] [address]Phone: [phone]: [phone]  Uses pill box? Yes Pt endorses good compliance  We discussed: Benefits of medication synchronization, packaging and delivery as well as enhanced pharmacist oversight with [facility]. Patient decided to: Utilize [facility] pharmacy for medication synchronization, packaging and delivery  Care Plan and Follow Up Patient Decision:  Patient agrees to Care Plan and Follow-up.  Plan: Telephone follow up appointment with care management team member scheduled for:  Nov 2023  NArizona Constable PFloridaD. -- [phone]

## 2022-03-06 ENCOUNTER — Encounter: Payer: Self-pay | Admitting: Family Medicine

## 2022-03-06 ENCOUNTER — Ambulatory Visit (INDEPENDENT_AMBULATORY_CARE_PROVIDER_SITE_OTHER): Payer: Medicare HMO | Admitting: Family Medicine

## 2022-03-06 ENCOUNTER — Telehealth: Payer: Self-pay

## 2022-03-06 VITALS — BP 110/60 | HR 80 | Temp 97.0°F | Resp 18 | Ht 64.0 in | Wt 211.0 lb

## 2022-03-06 DIAGNOSIS — J208 Acute bronchitis due to other specified organisms: Secondary | ICD-10-CM

## 2022-03-06 DIAGNOSIS — H9201 Otalgia, right ear: Secondary | ICD-10-CM | POA: Diagnosis not present

## 2022-03-06 LAB — POC COVID19 BINAXNOW: SARS Coronavirus 2 Ag: NEGATIVE

## 2022-03-06 MED ORDER — TRIAMCINOLONE ACETONIDE 40 MG/ML IJ SUSP
80.0000 mg | Freq: Once | INTRAMUSCULAR | Status: AC
  Administered 2022-03-06: 80 mg via INTRAMUSCULAR

## 2022-03-06 MED ORDER — CEFTRIAXONE SODIUM 1 G IJ SOLR
1.0000 g | Freq: Once | INTRAMUSCULAR | Status: AC
  Administered 2022-03-06: 1 g via INTRAMUSCULAR

## 2022-03-06 NOTE — Assessment & Plan Note (Signed)
Worse despite amoxicillin. Recommend complete amoxicillin.  Rocephin 1 gm shot.  Kenalog 80 mg shot.  Follow up appt tomorrow with pulmonary. Scheduled for PFTs. Will defer further management to r. Shearon Stalls.

## 2022-03-06 NOTE — Progress Notes (Signed)
    Chronic Care Management Pharmacy Assistant   Name: SEKAI NAYAK  MRN: [id number] DOB: [birth year]   Reason for Encounter: PAP  03/06/22 Breztri PAP has been completed and uploaded to folder to be mailed out to pt home. This should be mailed out within a week to pt.     Medications: Outpatient Encounter Medications as of 03/06/2022  Medication Sig Note   albuterol (PROVENTIL) (2.5 MG/3ML) 0.083% nebulizer solution Take 3 mLs (2.5 mg total) by nebulization every 6 (six) hours as needed for wheezing or shortness of breath.    albuterol (VENTOLIN HFA) 108 (90 Base) MCG/ACT inhaler Inhale 2 puffs into the lungs every 6 (six) hours as needed for wheezing or shortness of breath.    amoxicillin (AMOXIL) 875 MG tablet Take 1 tablet (875 mg total) by mouth 2 (two) times daily.    Aspirin 81 MG CAPS Take 81 mg by mouth daily.    Budeson-Glycopyrrol-Formoterol (BREZTRI AEROSPHERE) 160-9-4.8 MCG/ACT AERO Inhale 2 puffs into the lungs 2 (two) times daily.    Continuous Blood Gluc Receiver ([place]) DEVI 1 each by Does not apply route in the morning, at noon, and at bedtime. (Patient not taking: Reported on 02/27/2022)    Continuous Blood Gluc Sensor (DEXCOM G6 SENSOR) MISC Check sugars qac and qhs. (Patient not taking: Reported on 02/27/2022)    Continuous Blood Gluc Transmit (DEXCOM G6 TRANSMITTER) MISC 1 each by Does not apply route every 3 (three) months. (Patient not taking: Reported on 02/27/2022)    dapagliflozin propanediol (FARXIGA) 10 MG TABS tablet Take 1 tablet (10 mg total) by mouth daily before breakfast.    fluticasone (FLONASE) 50 MCG/ACT nasal spray Place 2 sprays into both nostrils daily.    glucose blood (RELION GLUCOSE TEST STRIPS) test strip Use as instructed    insulin degludec (TRESIBA FLEXTOUCH) 200 UNIT/ML FlexTouch Pen Inject 30 Units into the skin daily. 05/23/2021: Per Dr. [name]'s recommendation patient is to decrease to 20 units daily when beginning Ozempic 2 mg weekly    metFORMIN (GLUCOPHAGE) 1000 MG tablet TAKE ONE TABLET BY MOUTH TWICE DAILY with A meal    montelukast (SINGULAIR) 10 MG tablet Take 1 tablet (10 mg total) by mouth at bedtime.    Multiple Vitamin (MULTIVITAMIN) tablet Take 1 tablet by mouth daily.    omeprazole (PRILOSEC) 40 MG capsule Take 1 capsule (40 mg total) by mouth daily.    pioglitazone (ACTOS) 30 MG tablet Take 1 tablet (30 mg total) by mouth daily.    pravastatin (PRAVACHOL) 40 MG tablet TAKE ONE TABLET BY MOUTH EVERYDAY AT BEDTIME    ramipril (ALTACE) 2.5 MG capsule TAKE ONE CAPSULE BY MOUTH EVERY MORNING    Semaglutide (OZEMPIC, 2 MG/DOSE, [place]) Inject 2 mg into the skin once a week.    spironolactone (ALDACTONE) 25 MG tablet TAKE ONE TABLET BY MOUTH BEFORE BREAKFAST    Facility-Administered Encounter Medications as of 03/06/2022  Medication   ipratropium-albuterol (DUONEB) 0.5-2.5 (3) MG/3ML nebulizer solution 3 mL    [name], [place] Pharmacist Assistant  [phone]

## 2022-03-06 NOTE — Progress Notes (Signed)
Acute Office Visit  Subjective:    Patient ID: Brandi Velazquez, female    DOB: 1961/02/19, 61 y.o.   MRN: [id number]  Chief Complaint  Patient presents with   Cough   ear congestion    HPI: Patient is in today for worsening cough. Coughing for 5 months. Cxr, ct chest. Saw pulmonary.  On breztri 2 puffs twice daily and albuterol. Breztri does not seem to help, but albuterol does. Entire family has been sick.   Past Medical History:  Diagnosis Date   Ankle pain    Chronic pain    back   COPD (chronic obstructive pulmonary disease) (HCC)    Depression    Diabetes mellitus    GERD (gastroesophageal reflux disease)    Hyperlipidemia    Hypertension    Low back pain    Obstructive sleep apnea    Pneumonia 2010   Post-menopausal atrophic vaginitis    Superficial phlebitis     Past Surgical History:  Procedure Laterality Date   BACK SURGERY  1990   CESAREAN SECTION     CYST REMOVAL HAND     inner thigh   SPINE SURGERY     lumbar laminectomy and diskectomy    Family History  Problem Relation Age of Onset   Diabetes Mother    Heart disease Mother    Cancer Father    Diabetes Sister    Stroke Sister    Lung disease Neg Hx     Social History   Socioeconomic History   Marital status: Single    Spouse name: Not on file   Number of children: Not on file   Years of education: Not on file   Highest education level: Not on file  Occupational History   Not on file  Tobacco Use   Smoking status: Former    Packs/day: 1.50    Years: 15.00    Total pack years: 22.50    Types: Cigarettes    Quit date: 03/20/2009    Years since quitting: 12.9   Smokeless tobacco: Never  Vaping Use   Vaping Use: Never used  Substance and Sexual Activity   Alcohol use: No   Drug use: No   Sexual activity: Not on file  Other Topics Concern   Not on file  Social History Narrative   Not on file   Social Determinants of Health   Financial Resource Strain: High Risk (03/05/2022)    Overall Financial Resource Strain (CARDIA)    Difficulty of Paying Living Expenses: Hard  Food Insecurity: No Food Insecurity (02/05/2022)   Hunger Vital Sign    Worried About Running Out of Food in the Last Year: Never true    Ran Out of Food in the Last Year: Never true  Transportation Needs: No Transportation Needs (03/05/2022)   PRAPARE - Hydrologist (Medical): No    Lack of Transportation (Non-Medical): No  Physical Activity: Inactive (02/05/2022)   Exercise Vital Sign    Days of Exercise per Week: 0 days    Minutes of Exercise per Session: 0 min  Stress: Not on file  Social Connections: Unknown (02/05/2022)   Social Connection and Isolation Panel [NHANES]    Frequency of Communication with Friends and Family: More than three times a week    Frequency of Social Gatherings with Friends and Family: More than three times a week    Attends Religious Services: 1 to 4 times per year    Active  Member of Clubs or Organizations: No    Attends Archivist Meetings: Never    Marital Status: Not on file  Intimate Partner Violence: Not At Risk (02/05/2022)   Humiliation, Afraid, Rape, and Kick questionnaire    Fear of Current or Ex-Partner: No    Emotionally Abused: No    Physically Abused: No    Sexually Abused: No    Outpatient Medications Prior to Visit  Medication Sig Dispense Refill   albuterol (PROVENTIL) (2.5 MG/3ML) 0.083% nebulizer solution Take 3 mLs (2.5 mg total) by nebulization every 6 (six) hours as needed for wheezing or shortness of breath. 75 mL 12   albuterol (VENTOLIN HFA) 108 (90 Base) MCG/ACT inhaler Inhale 2 puffs into the lungs every 6 (six) hours as needed for wheezing or shortness of breath. 8 g 2   amoxicillin (AMOXIL) 875 MG tablet Take 1 tablet (875 mg total) by mouth 2 (two) times daily. 20 tablet 0   Aspirin 81 MG CAPS Take 81 mg by mouth daily.     Budeson-Glycopyrrol-Formoterol (BREZTRI AEROSPHERE) 160-9-4.8 MCG/ACT AERO  Inhale 2 puffs into the lungs 2 (two) times daily. 10.7 g 11   Continuous Blood Gluc Receiver (DEXCOM G6 RECEIVER) DEVI 1 each by Does not apply route in the morning, at noon, and at bedtime. (Patient not taking: Reported on 02/27/2022) 1 each 3   Continuous Blood Gluc Sensor (DEXCOM G6 SENSOR) MISC Check sugars qac and qhs. (Patient not taking: Reported on 02/27/2022) 9 each 3   Continuous Blood Gluc Transmit (DEXCOM G6 TRANSMITTER) MISC 1 each by Does not apply route every 3 (three) months. (Patient not taking: Reported on 02/27/2022) 1 each 3   dapagliflozin propanediol (FARXIGA) 10 MG TABS tablet Take 1 tablet (10 mg total) by mouth daily before breakfast. 90 tablet 3   fluticasone (FLONASE) 50 MCG/ACT nasal spray Place 2 sprays into both nostrils daily. 48 g 3   glucose blood (RELION GLUCOSE TEST STRIPS) test strip Use as instructed 100 each 2   insulin degludec (TRESIBA FLEXTOUCH) 200 UNIT/ML FlexTouch Pen Inject 30 Units into the skin daily. 15 mL 0   metFORMIN (GLUCOPHAGE) 1000 MG tablet TAKE ONE TABLET BY MOUTH TWICE DAILY with A meal 180 tablet 1   montelukast (SINGULAIR) 10 MG tablet Take 1 tablet (10 mg total) by mouth at bedtime. 90 tablet 3   Multiple Vitamin (MULTIVITAMIN) tablet Take 1 tablet by mouth daily.     omeprazole (PRILOSEC) 40 MG capsule Take 1 capsule (40 mg total) by mouth daily. 90 capsule 0   pioglitazone (ACTOS) 30 MG tablet Take 1 tablet (30 mg total) by mouth daily. 90 tablet 1   pravastatin (PRAVACHOL) 40 MG tablet TAKE ONE TABLET BY MOUTH EVERYDAY AT BEDTIME 90 tablet 1   ramipril (ALTACE) 2.5 MG capsule TAKE ONE CAPSULE BY MOUTH EVERY MORNING 90 capsule 1   Semaglutide (OZEMPIC, 2 MG/DOSE, [place]) Inject 2 mg into the skin once a week.     spironolactone (ALDACTONE) 25 MG tablet TAKE ONE TABLET BY MOUTH BEFORE BREAKFAST 90 tablet 1   Facility-Administered Medications Prior to Visit  Medication Dose Route Frequency Provider Last Rate Last Admin   ipratropium-albuterol  (DUONEB) 0.5-2.5 (3) MG/3ML nebulizer solution 3 mL  3 mL Nebulization Once Rip Harbour, NP        Allergies  Allergen Reactions   Bactrim [Sulfamethoxazole-Trimethoprim] Swelling   Morphine And Related     Review of Systems  Constitutional:  Positive for fatigue. Negative  for chills and fever.  HENT:  Positive for congestion, ear pain and rhinorrhea. Negative for sore throat.   Respiratory:  Positive for cough and shortness of breath.   Hematological:        Tender glands        Objective:    Physical Exam Vitals reviewed.  Constitutional:      Appearance: Normal appearance. She is obese.  HENT:     Right Ear: Tympanic membrane, ear canal and external ear normal.     Left Ear: Tympanic membrane, ear canal and external ear normal.     Nose: Congestion present.     Mouth/Throat:     Pharynx: Oropharynx is clear. No oropharyngeal exudate or posterior oropharyngeal erythema.  Cardiovascular:     Rate and Rhythm: Normal rate and regular rhythm.     Heart sounds: Normal heart sounds. No murmur heard. Pulmonary:     Effort: Pulmonary effort is normal. No respiratory distress.     Breath sounds: Wheezing present.     Comments: Aggressive coughing. Lymphadenopathy:     Cervical: Cervical adenopathy (rt anterior cervical.) present.  Neurological:     Mental Status: She is alert and oriented to person, place, and time.  Psychiatric:        Mood and Affect: Mood normal.        Behavior: Behavior normal.     BP 110/60   Pulse 80   Temp (!) 97 F (36.1 C)   Resp 18   Ht 5' 4" (1.626 m)   Wt 211 lb (95.7 kg)   BMI 36.22 kg/m  Wt Readings from Last 3 Encounters:  03/06/22 211 lb (95.7 kg)  02/27/22 210 lb (95.3 kg)  01/18/22 222 lb 9.6 oz (101 kg)    Health Maintenance Due  Topic Date Due   DEXA SCAN  Never done   Zoster Vaccines- Shingrix (1 of 2) Never done   COVID-19 Vaccine (4 - Booster for Moderna series) 10/17/2021    There are no preventive care  reminders to display for this patient.   Lab Results  Component Value Date   TSH 1.500 11/22/2021   Lab Results  Component Value Date   WBC 14.0 (H) 02/27/2022   HGB 15.4 02/27/2022   HCT 46.2 02/27/2022   MCV 87 02/27/2022   PLT 233 02/27/2022   Lab Results  Component Value Date   NA 143 02/27/2022   K 3.6 02/27/2022   CO2 23 02/27/2022   GLUCOSE 105 (H) 02/27/2022   BUN 24 02/27/2022   CREATININE 0.71 02/27/2022   BILITOT 0.4 02/27/2022   ALKPHOS 61 02/27/2022   AST 11 02/27/2022   ALT 12 02/27/2022   PROT 6.0 02/27/2022   ALBUMIN 4.6 02/27/2022   CALCIUM 9.9 02/27/2022   EGFR 97 02/27/2022   Lab Results  Component Value Date   CHOL 125 02/27/2022   Lab Results  Component Value Date   HDL 58 02/27/2022   Lab Results  Component Value Date   LDLCALC 53 02/27/2022   Lab Results  Component Value Date   TRIG 69 02/27/2022   Lab Results  Component Value Date   CHOLHDL 2.2 02/27/2022   Lab Results  Component Value Date   HGBA1C 6.7 (H) 02/27/2022       Assessment & Plan:   Problem List Items Addressed This Visit       Respiratory   Acute bronchitis due to other specified organisms - Primary    Worse despite  amoxicillin. Recommend complete amoxicillin.  Rocephin 1 gm shot.  Kenalog 80 mg shot.  Follow up appt tomorrow with pulmonary. Scheduled for PFTs. Will defer further management to r. Shearon Stalls.      Relevant Orders   POC COVID-19 (Completed)     Other   Otalgia of right ear    No evidence of infection.       Meds ordered this encounter  Medications   triamcinolone acetonide (KENALOG-40) injection 80 mg   cefTRIAXone (ROCEPHIN) injection 1 g    Orders Placed This Encounter  Procedures   POC COVID-19     Follow-up: Return if symptoms worsen or fail to improve.  An After Visit Summary was printed and given to the patient.  [name], MD [name] [phone]

## 2022-03-06 NOTE — Assessment & Plan Note (Signed)
No evidence of infection

## 2022-03-07 ENCOUNTER — Encounter: Payer: Self-pay | Admitting: Internal Medicine

## 2022-03-07 ENCOUNTER — Ambulatory Visit (INDEPENDENT_AMBULATORY_CARE_PROVIDER_SITE_OTHER): Payer: Medicare HMO | Admitting: Internal Medicine

## 2022-03-07 VITALS — BP 122/74 | HR 79 | Temp 98.2°F | Ht 63.0 in | Wt 212.0 lb

## 2022-03-07 DIAGNOSIS — D7219 Other eosinophilia: Secondary | ICD-10-CM | POA: Diagnosis not present

## 2022-03-07 DIAGNOSIS — J449 Chronic obstructive pulmonary disease, unspecified: Secondary | ICD-10-CM | POA: Diagnosis not present

## 2022-03-07 DIAGNOSIS — R0602 Shortness of breath: Secondary | ICD-10-CM | POA: Diagnosis not present

## 2022-03-07 DIAGNOSIS — J018 Other acute sinusitis: Secondary | ICD-10-CM

## 2022-03-07 DIAGNOSIS — J410 Simple chronic bronchitis: Secondary | ICD-10-CM

## 2022-03-07 DIAGNOSIS — R0609 Other forms of dyspnea: Secondary | ICD-10-CM

## 2022-03-07 DIAGNOSIS — J441 Chronic obstructive pulmonary disease with (acute) exacerbation: Secondary | ICD-10-CM | POA: Diagnosis not present

## 2022-03-07 DIAGNOSIS — R062 Wheezing: Secondary | ICD-10-CM

## 2022-03-07 LAB — PULMONARY FUNCTION TEST
DL/VA % pred: 116 %
DL/VA: 4.94 ml/min/mmHg/L
DLCO cor % pred: 121 %
DLCO cor: 23.71 ml/min/mmHg
DLCO unc % pred: 128 %
DLCO unc: 25.05 ml/min/mmHg
FEF 25-75 Post: 1.35 L/sec
FEF 25-75 Pre: 1.34 L/sec
FEF2575-%Change-Post: 0 %
FEF2575-%Pred-Post: 59 %
FEF2575-%Pred-Pre: 59 %
FEV1-%Change-Post: 0 %
FEV1-%Pred-Post: 80 %
FEV1-%Pred-Pre: 80 %
FEV1-Post: 1.97 L
FEV1-Pre: 1.97 L
FEV1FVC-%Change-Post: -5 %
FEV1FVC-%Pred-Pre: 91 %
FEV6-%Change-Post: 4 %
FEV6-%Pred-Post: 95 %
FEV6-%Pred-Pre: 90 %
FEV6-Post: 2.89 L
FEV6-Pre: 2.76 L
FEV6FVC-%Change-Post: -1 %
FEV6FVC-%Pred-Post: 102 %
FEV6FVC-%Pred-Pre: 103 %
FVC-%Change-Post: 6 %
FVC-%Pred-Post: 92 %
FVC-%Pred-Pre: 87 %
FVC-Post: 2.93 L
FVC-Pre: 2.76 L
Post FEV1/FVC ratio: 67 %
Post FEV6/FVC ratio: 99 %
Pre FEV1/FVC ratio: 71 %
Pre FEV6/FVC Ratio: 100 %
RV % pred: 127 %
RV: 2.5 L
TLC % pred: 110 %
TLC: 5.41 L

## 2022-03-07 LAB — POCT EXHALED NITRIC OXIDE: FeNO level (ppb): 7

## 2022-03-07 MED ORDER — CETIRIZINE HCL 10 MG PO TABS: 10.0000 mg | ORAL_TABLET | Freq: Every day | ORAL | 5 refills | Status: DC

## 2022-03-07 MED ORDER — BREZTRI AEROSPHERE 160-9-4.8 MCG/ACT IN AERO: 2.0000 | INHALATION_SPRAY | Freq: Two times a day (BID) | RESPIRATORY_TRACT | 11 refills | Status: DC

## 2022-03-07 MED ORDER — ALBUTEROL SULFATE HFA 108 (90 BASE) MCG/ACT IN AERS: 2.0000 | INHALATION_SPRAY | Freq: Four times a day (QID) | RESPIRATORY_TRACT | 2 refills | Status: DC | PRN

## 2022-03-07 MED ORDER — PREDNISONE 10 MG PO TABS: ORAL_TABLET | ORAL | 0 refills | Status: AC

## 2022-03-07 NOTE — Patient Instructions (Addendum)
Please schedule follow up scheduled with myself in 3 months.  If my schedule is not open yet, we will contact you with a reminder closer to that time. Please call [phone] if you haven't heard from [place] a month before.   Before your next visit I would like you to have: Blood work today  I think allergies are definitely worsening your COPD symptoms.  Start taking cetirizine every night.  Continue the montelukast. Continue flonase  I have refilled your breztri and albuterol today.   You can take albuterol nebs up to 4 times a day, especially like right now when you're feeling sick.

## 2022-03-07 NOTE — Progress Notes (Signed)
PFT done today. 

## 2022-03-07 NOTE — Addendum Note (Signed)
Addended by: Gavin Potters R on: 03/07/2022 02:49 PM   Modules accepted: Orders

## 2022-03-07 NOTE — Progress Notes (Signed)
Brandi Velazquez    [id number]    25-Apr-1961  Primary Care Physician:[name], [name], MD Date of Appointment: 03/07/2022 Established Patient Visit  Chief complaint:   Chief Complaint  Patient presents with   Follow-up    SOB unchanged, coughing      HPI: Brandi Velazquez is a 61 y.o. woman with shortness of breath and history of tobacco use disorder 20 plus pack year.  Interval Updates: Here for follow up after Pfts which show mild airflow limitation with hyperinflation. No significant response to BD.   Having a lot of   Breztri 2 puffs twice a day and albuterol nebs twice a day.   Started montelukast less than a week ago.     I have reviewed the patient's family social and past medical history and updated as appropriate.   Past Medical History:  Diagnosis Date   Ankle pain    Chronic pain    back   COPD (chronic obstructive pulmonary disease) (HCC)    Depression    Diabetes mellitus    GERD (gastroesophageal reflux disease)    Hyperlipidemia    Hypertension    Low back pain    Obstructive sleep apnea    Pneumonia 2010   Post-menopausal atrophic vaginitis    Superficial phlebitis     Past Surgical History:  Procedure Laterality Date   BACK SURGERY  1990   CESAREAN SECTION     CYST REMOVAL HAND     inner thigh   SPINE SURGERY     lumbar laminectomy and diskectomy    Family History  Problem Relation Age of Onset   Diabetes Mother    Heart disease Mother    Cancer Father    Diabetes Sister    Stroke Sister    Lung disease Neg Hx     Social History   Occupational History   Not on file  Tobacco Use   Smoking status: Former    Packs/day: 1.50    Years: 15.00    Total pack years: 22.50    Types: Cigarettes    Quit date: 03/20/2009    Years since quitting: 12.9   Smokeless tobacco: Never  Vaping Use   Vaping Use: Never used  Substance and Sexual Activity   Alcohol use: No   Drug use: No   Sexual activity: Not on file     Physical  Exam: Blood pressure 122/74, pulse 79, temperature 98.2 F (36.8 C), temperature source Oral, height '5\' 3"'$  (1.6 m), weight 212 lb (96.2 kg), SpO2 97 %.  Gen:      No acute distress ENT:  no nasal polyps, mucus membranes moist Lungs:    No increased respiratory effort, symmetric chest wall excursion, clear to auscultation bilaterally, faint end expiratory wheezes CV:         Regular rate and rhythm; no murmurs, rubs, or gallops.  No pedal edema   Data Reviewed: Imaging:   PFTs:     Latest Ref Rng & Units 03/07/2022   11:58 AM  PFT Results  FVC-Pre L 2.76  P  FVC-Predicted Pre % 87  P  FVC-Post L 2.93  P  FVC-Predicted Post % 92  P  Pre FEV1/FVC % % 71  P  Post FEV1/FCV % % 67  P  FEV1-Pre L 1.97  P  FEV1-Predicted Pre % 80  P  FEV1-Post L 1.97  P  DLCO uncorrected ml/min/mmHg 25.05  P  DLCO UNC% %  128  P  DLCO corrected ml/min/mmHg 23.71  P  DLCO COR %Predicted % 121  P  DLVA Predicted % 116  P  TLC L 5.41  P  TLC % Predicted % 110  P  RV % Predicted % 127  P    P Preliminary result   I have personally reviewed the patient's PFTs and mild airflow limitation without BD response  Labs: March 2023 CBC shows AEC 300, 200 on the two prior  Immunization status: Immunization History  Administered Date(s) Administered   Influenza Inj Mdck Quad Pf 07/18/2020, 05/18/2021   Moderna Covid-19 Vaccine Bivalent Booster 31yr & up 08/22/2021   Moderna SARS-COV2 Booster Vaccination 02/09/2021   Moderna Sars-Covid-2 Vaccination 10/07/2019, 11/02/2019   Pneumococcal Polysaccharide-23 10/09/2011   Tdap 05/15/2018    External Records Personally Reviewed: PCP  Assessment:  Suspected Asthma COPD overlap syndrome Peripheral eosinophilia AEC 300 Allergic rhinitis History of tobacco use  Plan/Recommendations: Continue breztri and prn albuterol Start taking cetirizine every night.  Continue the montelukast. Continue flonase Region 2 allergy panel today [place] obtained toady was  7 ppb   Return to Care: Return in about 3 months (around 06/07/2022).   NLenice Llamas MD [facility]

## 2022-03-08 DIAGNOSIS — H40023 Open angle with borderline findings, high risk, bilateral: Secondary | ICD-10-CM | POA: Diagnosis not present

## 2022-03-08 DIAGNOSIS — H524 Presbyopia: Secondary | ICD-10-CM | POA: Diagnosis not present

## 2022-03-08 LAB — RESPIRATORY ALLERGY PROFILE REGION II ~~LOC~~
Allergen, A. alternata, m6: <0.1 kU/L
Allergen, Cedar tree, t12: <0.1 kU/L
Allergen, Comm Silver Birch, t9: <0.1 kU/L
Allergen, Cottonwood, t14: <0.1 kU/L
Allergen, D pternoyssinus,d7: <0.1 kU/L
Allergen, Mouse Urine Protein, e78: <0.1 kU/L
Allergen, Mulberry, t76: <0.1 kU/L
Allergen, Oak,t7: <0.1 kU/L
Allergen, P. notatum, m1: <0.1 kU/L
Aspergillus fumigatus, m3: <0.1 kU/L
Bermuda Grass: <0.1 kU/L
Box Elder IgE: <0.1 kU/L
CLADOSPORIUM HERBARUM (M2) IGE: <0.1 kU/L
COMMON RAGWEED (SHORT) (W1) IGE: <0.1 kU/L
Cat Dander: <0.1 kU/L
Class: 0
Class: 0
Class: 0
Class: 0
Class: 0
Class: 0
Class: 0
Class: 0
Class: 0
Class: 0
Class: 0
Class: 0
Class: 0
Class: 0
Class: 0
Class: 0
Class: 0
Class: 0
Class: 0
Class: 0
Class: 0
Class: 0
Class: 0
Cockroach: 0.14 kU/L — ABNORMAL HIGH
D. farinae: <0.1 kU/L
Dog Dander: <0.1 kU/L
Elm IgE: <0.1 kU/L
IgE (Immunoglobulin E), Serum: 104 kU/L (ref <=114)
Johnson Grass: <0.1 kU/L
Pecan/Hickory Tree IgE: <0.1 kU/L
Rough Pigweed  IgE: <0.1 kU/L
Sheep Sorrel IgE: <0.1 kU/L
Timothy Grass: <0.1 kU/L

## 2022-03-08 LAB — HM DIABETES EYE EXAM

## 2022-03-08 LAB — INTERPRETATION:

## 2022-03-13 ENCOUNTER — Encounter: Payer: Self-pay | Admitting: Family Medicine

## 2022-03-16 DIAGNOSIS — E1159 Type 2 diabetes mellitus with other circulatory complications: Secondary | ICD-10-CM | POA: Diagnosis not present

## 2022-03-16 DIAGNOSIS — Z7985 Long-term (current) use of injectable non-insulin antidiabetic drugs: Secondary | ICD-10-CM

## 2022-03-16 DIAGNOSIS — Z794 Long term (current) use of insulin: Secondary | ICD-10-CM

## 2022-03-16 DIAGNOSIS — E785 Hyperlipidemia, unspecified: Secondary | ICD-10-CM

## 2022-03-16 DIAGNOSIS — I110 Hypertensive heart disease with heart failure: Secondary | ICD-10-CM

## 2022-03-17 ENCOUNTER — Other Ambulatory Visit: Payer: Self-pay | Admitting: Nurse Practitioner

## 2022-03-17 ENCOUNTER — Other Ambulatory Visit: Payer: Self-pay | Admitting: Family Medicine

## 2022-03-17 DIAGNOSIS — E782 Mixed hyperlipidemia: Secondary | ICD-10-CM

## 2022-03-21 ENCOUNTER — Telehealth: Payer: Self-pay

## 2022-03-21 NOTE — Progress Notes (Unsigned)
Chronic Care Management Pharmacy Assistant   Name: Brandi Velazquez  MRN: [id number] DOB: 1961-07-27   Reason for Encounter: Medication Coordination for [place]stream    Recent office visits:  03/06/22 [name] MD. Seen for cold symptoms. No med changes.  Recent consult visits:  03/07/22 (Pulmonary) Lenice Llamas MD. Seen for COPD. Started on Cetirizine HCI '10mg'$  and Prednisone '10mg'$ .  Hospital visits:  None  Medications: Outpatient Encounter Medications as of 03/21/2022  Medication Sig Note   albuterol (PROVENTIL) (2.5 MG/3ML) 0.083% nebulizer solution Take 3 mLs (2.5 mg total) by nebulization every 6 (six) hours as needed for wheezing or shortness of breath.    albuterol (VENTOLIN HFA) 108 (90 Base) MCG/ACT inhaler Inhale 2 puffs into the lungs every 6 (six) hours as needed for wheezing or shortness of breath.    amoxicillin (AMOXIL) 875 MG tablet Take 1 tablet (875 mg total) by mouth 2 (two) times daily.    Aspirin 81 MG CAPS Take 81 mg by mouth daily.    Budeson-Glycopyrrol-Formoterol (BREZTRI AEROSPHERE) 160-9-4.8 MCG/ACT AERO Inhale 2 puffs into the lungs 2 (two) times daily.    cetirizine (ZYRTEC) 10 MG tablet Take 1 tablet (10 mg total) by mouth at bedtime.    Continuous Blood Gluc Receiver ([place]) DEVI 1 each by Does not apply route in the morning, at noon, and at bedtime.    Continuous Blood Gluc Sensor (DEXCOM G6 SENSOR) MISC Check sugars qac and qhs.    Continuous Blood Gluc Transmit (DEXCOM G6 TRANSMITTER) MISC 1 each by Does not apply route every 3 (three) months.    dapagliflozin propanediol (FARXIGA) 10 MG TABS tablet Take 1 tablet (10 mg total) by mouth daily before breakfast.    fluticasone (FLONASE) 50 MCG/ACT nasal spray Place 2 sprays into both nostrils daily.    glucose blood (RELION GLUCOSE TEST STRIPS) test strip Use as instructed    insulin degludec (TRESIBA FLEXTOUCH) 200 UNIT/ML FlexTouch Pen Inject 30 Units into the skin daily. 05/23/2021: Per Dr.  [name]'s recommendation patient is to decrease to 20 units daily when beginning Ozempic 2 mg weekly   metFORMIN (GLUCOPHAGE) 1000 MG tablet TAKE ONE TABLET BY MOUTH TWICE DAILY WITH A MEAL    montelukast (SINGULAIR) 10 MG tablet Take 1 tablet (10 mg total) by mouth at bedtime.    Multiple Vitamin (MULTIVITAMIN) tablet Take 1 tablet by mouth daily.    omeprazole (PRILOSEC) 40 MG capsule Take 1 capsule (40 mg total) by mouth daily.    pioglitazone (ACTOS) 30 MG tablet Take 1 tablet (30 mg total) by mouth daily.    pravastatin (PRAVACHOL) 40 MG tablet TAKE ONE TABLET BY MOUTH EVERYDAY AT BEDTIME    ramipril (ALTACE) 2.5 MG capsule TAKE ONE CAPSULE BY MOUTH EVERY MORNING    Semaglutide (OZEMPIC, 2 MG/DOSE, [place]) Inject 2 mg into the skin once a week.    spironolactone (ALDACTONE) 25 MG tablet TAKE ONE TABLET BY MOUTH BEFORE BREAKFAST    Facility-Administered Encounter Medications as of 03/21/2022  Medication   ipratropium-albuterol (DUONEB) 0.5-2.5 (3) MG/3ML nebulizer solution 3 mL    Reviewed chart for medication changes ahead of medication coordination call.  No hospital visits since last care coordination call/Pharmacist visit.   No medication changes indicated OR if recent visit, treatment plan here.  BP Readings from Last 3 Encounters:  03/07/22 122/74  03/06/22 110/60  02/27/22 110/60    Lab Results  Component Value Date   HGBA1C 6.7 (H) 02/27/2022  Patient obtains medications through Adherence Packaging  90 Days   Last adherence delivery included:  Metformin HCI '1000mg'$  1 B, 1 EM Spironolactone '25mg'$  1 BB Pravastatin '40mg'$  1 BT Ramipril 2.'5mg'$  1 B Pioglitazone HCI '30mg'$  1 BB Glimepiride '4mg'$  Two tablets before breakfast ([place]ott[place])  (Note: Pt is back on only temporary until she gets her Ozempic in the mail from [facility]. She only has a week left and is still waiting on her shipment. )  Patient declined (meds) last delivery Patient gets Patient Assistance for the following: Farxiga  '10mg'$  Ozempic 2 mg Tresiba 200 units Meloxicam '15mg'$ - Denied refills through provider. Pt will schedule f/u  Breztri- Received a supply through [facility] 12/12/21 30ds Albuterol Inhaler- Only uses prn, does not need last filled 10/03/21 25ds  Patient is due for next adherence delivery on: 04/02/22. Called patient and reviewed medications and coordinated delivery.  This delivery to include: Metformin HCI '1000mg'$  1 B, 1 EM Spironolactone '25mg'$  1 BB Pravastatin '40mg'$  1 BT Ramipril 2.'5mg'$  1 B Pioglitazone HCI '30mg'$  1 BB Glimepiride '4mg'$  Two tablets before breakfast ([place]ott[place])   Patient declined the following medications  Patient gets Patient Assistance for the following: Farxiga '10mg'$  Ozempic 2 mg Tresiba 200 units Breztri- Received a supply through [facility] 03/08/22 30ds Albuterol Inhaler- Only uses prn, does not need last filled 10/03/21 25ds  Patient needs refills  Glimepiride '4mg'$    Confirmed delivery date of ***, advised patient that pharmacy will contact them the morning of delivery.  [name], [place] Pharmacist Assistant  [phone]

## 2022-03-22 NOTE — Telephone Encounter (Signed)
Compliant on meds 

## 2022-03-30 ENCOUNTER — Telehealth: Payer: Self-pay

## 2022-03-30 NOTE — Telephone Encounter (Signed)
Patient called this morning with questions regarding her ozempic patient assistance. Patient stated on the phone that she only has two doses left. She was unsure if it will automatically renew or not. Also, patient stated that she was unable to leave a message for Ovid Curd and Felicity Coyer due to the mailbox being full. Patient is requesting that someone call her back. Thank you.

## 2022-03-30 NOTE — Telephone Encounter (Signed)
120- day supply of Novofine needles and Tresiba 200 u/ml will be filled on 03/18/2022 and  should arrive to the office in 10-14 business days. Medication should arrive to the office next week. This was the last notification that was noted last month. Hope this helps.  Pattricia Boss, [place] Pharmacist Assistant [phone]

## 2022-04-02 ENCOUNTER — Telehealth: Payer: Self-pay

## 2022-04-02 NOTE — Telephone Encounter (Signed)
Left message that medications were shipped.

## 2022-04-02 NOTE — Telephone Encounter (Signed)
Inquired on teams with team in regards to [place] in particular

## 2022-04-02 NOTE — Chronic Care Management (AMB) (Signed)
Followed up on medication delivery for Ozempic, Tresiba and Novofine needles, called automated service with [employer], all 3 are in process of being shipped. Per last [employer], medications were being filled on 03/18/2022 and  should arrive to the office in 10-14 business days. Medications should arrive to the office next week. Patient was notified by clinical team.  Pattricia Boss, [place] Pharmacist Assistant [phone]

## 2022-04-11 ENCOUNTER — Telehealth: Payer: Self-pay

## 2022-04-11 NOTE — Chronic Care Management (AMB) (Signed)
[employer] patient assistance program notification:  120- day supply of Ozempic 2 mg/ml, Novofine needles and Tresiba 200 u/ml will be filled 03/19/2022 and  should arrive to the office in 10-14 business days. Patient has 1  refill remaining on all and enrollment will expire on 08/16/2022.  The next refill for patient will be fulfilled on 06/06/2022.  Pattricia Boss, [place] Pharmacist Assistant [phone]

## 2022-05-10 DIAGNOSIS — H33321 Round hole, right eye: Secondary | ICD-10-CM | POA: Diagnosis not present

## 2022-05-10 DIAGNOSIS — E113293 Type 2 diabetes mellitus with mild nonproliferative diabetic retinopathy without macular edema, bilateral: Secondary | ICD-10-CM | POA: Diagnosis not present

## 2022-05-10 DIAGNOSIS — H2513 Age-related nuclear cataract, bilateral: Secondary | ICD-10-CM | POA: Diagnosis not present

## 2022-05-24 ENCOUNTER — Ambulatory Visit (INDEPENDENT_AMBULATORY_CARE_PROVIDER_SITE_OTHER): Payer: Medicare HMO | Admitting: Family Medicine

## 2022-05-24 ENCOUNTER — Encounter: Payer: Self-pay | Admitting: Family Medicine

## 2022-05-24 VITALS — BP 100/60 | HR 74 | Temp 97.7°F | Resp 15 | Ht 63.0 in | Wt 199.0 lb

## 2022-05-24 DIAGNOSIS — G8929 Other chronic pain: Secondary | ICD-10-CM | POA: Diagnosis not present

## 2022-05-24 DIAGNOSIS — M25562 Pain in left knee: Secondary | ICD-10-CM

## 2022-05-24 MED ORDER — NAPROXEN 500 MG PO TABS: 500.0000 mg | ORAL_TABLET | Freq: Two times a day (BID) | ORAL | 0 refills | Status: DC

## 2022-05-24 NOTE — Assessment & Plan Note (Signed)
Naproxen 500 twice daily x 2 weeks. Call back if not better.  Xray ordered.  May use tylenol.  If not resolved or significantly improved, I would recommend knee injection and order mri.

## 2022-05-24 NOTE — Progress Notes (Signed)
Acute Office Visit  Subjective:    Patient ID: Brandi Velazquez, female    DOB: 04-09-1961, 61 y.o.   MRN: [id number]  Chief Complaint  Patient presents with   Knee Pain    Left pain    HPI: Patient is in today for left knee sharp, aching pain since one month ago. The pain radiate to above the knee and around the knee and pop up and give away when she walks. Advil might have helped some. No injury.   Sugars good.   Past Medical History:  Diagnosis Date   Ankle pain    Chronic pain    back   COPD (chronic obstructive pulmonary disease) (HCC)    Depression    Diabetes mellitus    GERD (gastroesophageal reflux disease)    Hyperlipidemia    Hypertension    Low back pain    Obstructive sleep apnea    Pneumonia 2010   Post-menopausal atrophic vaginitis    Superficial phlebitis     Past Surgical History:  Procedure Laterality Date   BACK SURGERY  1990   CESAREAN SECTION     CYST REMOVAL HAND     inner thigh   SPINE SURGERY     lumbar laminectomy and diskectomy    Family History  Problem Relation Age of Onset   Diabetes Mother    Heart disease Mother    Cancer Father    Diabetes Sister    Stroke Sister    Lung disease Neg Hx     Social History   Socioeconomic History   Marital status: Single    Spouse name: Not on file   Number of children: Not on file   Years of education: Not on file   Highest education level: Not on file  Occupational History   Not on file  Tobacco Use   Smoking status: Former    Packs/day: 1.50    Years: 15.00    Total pack years: 22.50    Types: Cigarettes    Quit date: 03/20/2009    Years since quitting: 13.1   Smokeless tobacco: Never  Vaping Use   Vaping Use: Never used  Substance and Sexual Activity   Alcohol use: No   Drug use: No   Sexual activity: Not on file  Other Topics Concern   Not on file  Social History Narrative   Not on file   Social Determinants of Health   Financial Resource Strain: High Risk  (03/05/2022)   Overall Financial Resource Strain (CARDIA)    Difficulty of Paying Living Expenses: Hard  Food Insecurity: No Food Insecurity (02/05/2022)   Hunger Vital Sign    Worried About Running Out of Food in the Last Year: Never true    Ran Out of Food in the Last Year: Never true  Transportation Needs: No Transportation Needs (03/05/2022)   PRAPARE - Hydrologist (Medical): No    Lack of Transportation (Non-Medical): No  Physical Activity: Inactive (02/05/2022)   Exercise Vital Sign    Days of Exercise per Week: 0 days    Minutes of Exercise per Session: 0 min  Stress: Not on file  Social Connections: Unknown (02/05/2022)   Social Connection and Isolation Panel [NHANES]    Frequency of Communication with Friends and Family: More than three times a week    Frequency of Social Gatherings with Friends and Family: More than three times a week    Attends Religious Services: 1 to  4 times per year    Active Member of Clubs or Organizations: No    Attends Archivist Meetings: Never    Marital Status: Not on file  Intimate Partner Violence: Not At Risk (02/05/2022)   Humiliation, Afraid, Rape, and Kick questionnaire    Fear of Current or Ex-Partner: No    Emotionally Abused: No    Physically Abused: No    Sexually Abused: No    Outpatient Medications Prior to Visit  Medication Sig Dispense Refill   albuterol (PROVENTIL) (2.5 MG/3ML) 0.083% nebulizer solution Take 3 mLs (2.5 mg total) by nebulization every 6 (six) hours as needed for wheezing or shortness of breath. 75 mL 12   albuterol (VENTOLIN HFA) 108 (90 Base) MCG/ACT inhaler Inhale 2 puffs into the lungs every 6 (six) hours as needed for wheezing or shortness of breath. 8 g 2   Aspirin 81 MG CAPS Take 81 mg by mouth every other day.     Budeson-Glycopyrrol-Formoterol (BREZTRI AEROSPHERE) 160-9-4.8 MCG/ACT AERO Inhale 2 puffs into the lungs 2 (two) times daily. 10.7 g 11   cetirizine (ZYRTEC) 10  MG tablet Take 1 tablet (10 mg total) by mouth at bedtime. 30 tablet 5   Continuous Blood Gluc Receiver (DEXCOM G6 RECEIVER) DEVI 1 each by Does not apply route in the morning, at noon, and at bedtime. 1 each 3   Continuous Blood Gluc Sensor (DEXCOM G6 SENSOR) MISC Check sugars qac and qhs. 9 each 3   Continuous Blood Gluc Transmit (DEXCOM G6 TRANSMITTER) MISC 1 each by Does not apply route every 3 (three) months. 1 each 3   dapagliflozin propanediol (FARXIGA) 10 MG TABS tablet Take 1 tablet (10 mg total) by mouth daily before breakfast. 90 tablet 3   fluticasone (FLONASE) 50 MCG/ACT nasal spray Place 2 sprays into both nostrils daily. 48 g 3   glucose blood (RELION GLUCOSE TEST STRIPS) test strip Use as instructed 100 each 2   insulin degludec (TRESIBA FLEXTOUCH) 200 UNIT/ML FlexTouch Pen Inject 30 Units into the skin daily. 15 mL 0   metFORMIN (GLUCOPHAGE) 1000 MG tablet TAKE ONE TABLET BY MOUTH TWICE DAILY WITH A MEAL 180 tablet 1   montelukast (SINGULAIR) 10 MG tablet Take 1 tablet (10 mg total) by mouth at bedtime. 90 tablet 3   Multiple Vitamin (MULTIVITAMIN) tablet Take 1 tablet by mouth daily.     pioglitazone (ACTOS) 30 MG tablet Take 1 tablet (30 mg total) by mouth daily. 90 tablet 1   pravastatin (PRAVACHOL) 40 MG tablet TAKE ONE TABLET BY MOUTH EVERYDAY AT BEDTIME 90 tablet 1   ramipril (ALTACE) 2.5 MG capsule TAKE ONE CAPSULE BY MOUTH EVERY MORNING 90 capsule 1   Semaglutide (OZEMPIC, 2 MG/DOSE, Sneads) Inject 2 mg into the skin once a week.     spironolactone (ALDACTONE) 25 MG tablet TAKE ONE TABLET BY MOUTH BEFORE BREAKFAST 90 tablet 1   amoxicillin (AMOXIL) 875 MG tablet Take 1 tablet (875 mg total) by mouth 2 (two) times daily. 20 tablet 0   omeprazole (PRILOSEC) 40 MG capsule Take 1 capsule (40 mg total) by mouth daily. (Patient not taking: Reported on 05/24/2022) 90 capsule 0   ipratropium-albuterol (DUONEB) 0.5-2.5 (3) MG/3ML nebulizer solution 3 mL      No facility-administered  medications prior to visit.    Allergies  Allergen Reactions   Bactrim [Sulfamethoxazole-Trimethoprim] Swelling   Morphine And Related     Review of Systems  Constitutional:  Negative for chills, fatigue and  fever.  HENT:  Negative for congestion, ear pain and sore throat.   Respiratory:  Negative for cough and shortness of breath.   Cardiovascular:  Negative for chest pain and palpitations.  Gastrointestinal:  Negative for abdominal pain, constipation, diarrhea, nausea and vomiting.  Endocrine: Negative for polydipsia, polyphagia and polyuria.  Genitourinary:  Negative for difficulty urinating and dysuria.  Musculoskeletal:  Positive for arthralgias (left knee pain). Negative for back pain and myalgias.  Skin:  Negative for rash.  Neurological:  Negative for headaches.  Psychiatric/Behavioral:  Negative for dysphoric mood. The patient is not nervous/anxious.        Objective:    Physical Exam Vitals reviewed.  Constitutional:      Appearance: Normal appearance.  Musculoskeletal:     Comments: RIGHT KNEE EXAM Tender: negative Patellar apprehension: negative. Latera ligament laxity: negative McMurray's signs: negative Anterior drawer movement/Lachmans: negative  LEFT KNEE EXAM Tender: yes. medially Patellar apprehension: negative. Latera ligament laxity: negative McMurray's signs: positive. Anterior drawer movement/Lachmans: negative   Neurological:     Mental Status: She is alert.     BP 100/60   Pulse 74   Temp 97.7 F (36.5 C)   Resp 15   Ht 5' 3"  (1.6 m)   Wt 199 lb (90.3 kg)   BMI 35.25 kg/m  Wt Readings from Last 3 Encounters:  05/24/22 199 lb (90.3 kg)  03/07/22 212 lb (96.2 kg)  03/06/22 211 lb (95.7 kg)    Health Maintenance Due  Topic Date Due   DEXA SCAN  Never done   Zoster Vaccines- Shingrix (1 of 2) Never done   COVID-19 Vaccine (4 - Moderna risk series) 10/17/2021   INFLUENZA VACCINE  04/17/2022    There are no preventive care  reminders to display for this patient.   Lab Results  Component Value Date   TSH 1.500 11/22/2021   Lab Results  Component Value Date   WBC 14.0 (H) 02/27/2022   HGB 15.4 02/27/2022   HCT 46.2 02/27/2022   MCV 87 02/27/2022   PLT 233 02/27/2022   Lab Results  Component Value Date   NA 143 02/27/2022   K 3.6 02/27/2022   CO2 23 02/27/2022   GLUCOSE 105 (H) 02/27/2022   BUN 24 02/27/2022   CREATININE 0.71 02/27/2022   BILITOT 0.4 02/27/2022   ALKPHOS 61 02/27/2022   AST 11 02/27/2022   ALT 12 02/27/2022   PROT 6.0 02/27/2022   ALBUMIN 4.6 02/27/2022   CALCIUM 9.9 02/27/2022   EGFR 97 02/27/2022   Lab Results  Component Value Date   CHOL 125 02/27/2022   Lab Results  Component Value Date   HDL 58 02/27/2022   Lab Results  Component Value Date   LDLCALC 53 02/27/2022   Lab Results  Component Value Date   TRIG 69 02/27/2022   Lab Results  Component Value Date   CHOLHDL 2.2 02/27/2022   Lab Results  Component Value Date   HGBA1C 6.7 (H) 02/27/2022       Assessment & Plan:   Problem List Items Addressed This Visit       Other   Chronic pain of left knee - Primary    Naproxen 500 twice daily x 2 weeks. Call back if not better.  Xray ordered.  May use tylenol.  If not resolved or significantly improved, I would recommend knee injection and order mri.       Relevant Medications   naproxen (NAPROSYN) 500 MG tablet  Other Relevant Orders   DG Knee Complete 4 Views Left   Meds ordered this encounter  Medications   naproxen (NAPROSYN) 500 MG tablet    Sig: Take 1 tablet (500 mg total) by mouth 2 (two) times daily with a meal.    Dispense:  60 tablet    Refill:  0    Orders Placed This Encounter  Procedures   DG Knee Complete 4 Views Left     Follow-up: No follow-ups on file.  An After Visit Summary was printed and given to the patient.  [name], MD [facility] [phone]

## 2022-05-30 ENCOUNTER — Other Ambulatory Visit: Payer: Self-pay

## 2022-05-30 DIAGNOSIS — G8929 Other chronic pain: Secondary | ICD-10-CM

## 2022-06-01 ENCOUNTER — Ambulatory Visit: Payer: Medicare HMO | Admitting: Family Medicine

## 2022-06-06 ENCOUNTER — Ambulatory Visit: Payer: Medicare HMO | Admitting: Internal Medicine

## 2022-06-07 DIAGNOSIS — H33321 Round hole, right eye: Secondary | ICD-10-CM | POA: Diagnosis not present

## 2022-06-07 HISTORY — PX: REFRACTIVE SURGERY: SHX103

## 2022-06-11 ENCOUNTER — Encounter: Payer: Self-pay | Admitting: Family Medicine

## 2022-06-11 ENCOUNTER — Ambulatory Visit (INDEPENDENT_AMBULATORY_CARE_PROVIDER_SITE_OTHER): Payer: Medicare HMO | Admitting: Family Medicine

## 2022-06-11 VITALS — BP 104/62 | HR 85 | Temp 98.1°F | Resp 15 | Ht 63.0 in | Wt 204.0 lb

## 2022-06-11 DIAGNOSIS — J449 Chronic obstructive pulmonary disease, unspecified: Secondary | ICD-10-CM

## 2022-06-11 DIAGNOSIS — E1142 Type 2 diabetes mellitus with diabetic polyneuropathy: Secondary | ICD-10-CM

## 2022-06-11 DIAGNOSIS — E782 Mixed hyperlipidemia: Secondary | ICD-10-CM | POA: Diagnosis not present

## 2022-06-11 DIAGNOSIS — E1159 Type 2 diabetes mellitus with other circulatory complications: Secondary | ICD-10-CM | POA: Diagnosis not present

## 2022-06-11 DIAGNOSIS — Z1231 Encounter for screening mammogram for malignant neoplasm of breast: Secondary | ICD-10-CM | POA: Diagnosis not present

## 2022-06-11 DIAGNOSIS — Z23 Encounter for immunization: Secondary | ICD-10-CM | POA: Diagnosis not present

## 2022-06-11 DIAGNOSIS — I152 Hypertension secondary to endocrine disorders: Secondary | ICD-10-CM

## 2022-06-11 DIAGNOSIS — Z6836 Body mass index (BMI) 36.0-36.9, adult: Secondary | ICD-10-CM

## 2022-06-11 MED ORDER — DAPAGLIFLOZIN PROPANEDIOL 10 MG PO TABS: 10.0000 mg | ORAL_TABLET | Freq: Every day | ORAL | 3 refills | Status: DC

## 2022-06-11 NOTE — Assessment & Plan Note (Signed)
Control: good Recommend check feet daily. Recommend annual eye exams. Medicines: Continue Tresiba 30 units daily, Metformin 1000 mg twice a day, Actos 30 mg daily, Farxiga 10 mg daily, Ozempic 2 mg weekly. Continue to work on eating a healthy diet and exercise.  Labs drawn today.

## 2022-06-11 NOTE — Assessment & Plan Note (Signed)
Recommend continue to work on eating healthy diet and exercise.  °Comorbidities: diabetes, hyperlipidemia °

## 2022-06-11 NOTE — Assessment & Plan Note (Signed)
Stable. The current medical regimen is effective;  continue present plan and medications.  

## 2022-06-11 NOTE — Assessment & Plan Note (Signed)
Well controlled.  Continue ramipril 2.5 mg daily.

## 2022-06-11 NOTE — Progress Notes (Signed)
Subjective:  Patient ID: Brandi Velazquez, female    DOB: 02-15-1961  Age: 61 y.o. MRN: [id number]  Chief Complaint  Patient presents with   Diabetes   Hypertension   Hyperlipidemia    HPI Diabetes:  Complications: hyperlipidemia Glucose checking: 2 times per week Glucose logs: 99-120's Hypoglycemia: no Most recent A1C: 6.7% Current medications: Tresiba 30 units daily, Metformin 1000 mg twice a day, Actos 30 mg daily, Farxiga 10 mg daily, Ozempic 2 mg weekly. Last Eye Exam: 03/08/2022 Foot checks: daily  Hyperlipidemia: Current medications: Pravastatin 40 mg daily.  Hypertension: Complications: Diabetes, Hyperlipidemia. Current medications: Ramipril 2.5 mg daily.  GERD: Patient is not taking Omeprazole 40 mg daily. Never really started it.   COPD: no breathing problems. Patient stopped breztri and albuterol. Taking singulair.  Diet: Eating fair. Exercise:  Walking. Running after toddlers. 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   Current Outpatient Medications on File Prior to Visit  Medication Sig Dispense Refill   fluticasone (FLONASE) 50 MCG/ACT nasal spray Place 2 sprays into both nostrils daily. 48 g 3   glucose blood (RELION GLUCOSE TEST STRIPS) test strip Use as instructed 100 each 2   insulin degludec (TRESIBA FLEXTOUCH) 200 UNIT/ML FlexTouch Pen Inject 30 Units into the skin daily. 15 mL 0   metFORMIN (GLUCOPHAGE) 1000 MG tablet TAKE ONE TABLET BY MOUTH TWICE DAILY WITH A MEAL 180 tablet 1   montelukast (SINGULAIR) 10 MG tablet Take 1 tablet (10 mg total) by mouth at bedtime. 90 tablet 3   Multiple Vitamin (MULTIVITAMIN) tablet Take 1 tablet by mouth daily.     naproxen (NAPROSYN) 500 MG tablet Take 1 tablet (500 mg total) by mouth 2 (two) times daily with a meal. 60 tablet 0   omeprazole (PRILOSEC) 40 MG capsule Take 1 capsule (40 mg total) by mouth daily. 90 capsule 0   pioglitazone (ACTOS) 30 MG tablet Take 1 tablet (30 mg total) by mouth daily. 90  tablet 1   pravastatin (PRAVACHOL) 40 MG tablet TAKE ONE TABLET BY MOUTH EVERYDAY AT BEDTIME 90 tablet 1   ramipril (ALTACE) 2.5 MG capsule TAKE ONE CAPSULE BY MOUTH EVERY MORNING 90 capsule 1   Semaglutide (OZEMPIC, 2 MG/DOSE, Roy) Inject 2 mg into the skin once a week.     Continuous Blood Gluc Sensor (DEXCOM G6 SENSOR) MISC Check sugars qac and qhs. (Patient not taking: Reported on 06/11/2022) 9 each 3   No current facility-administered medications on file prior to visit.   Past Medical History:  Diagnosis Date   Ankle pain    Chronic pain    back   COPD (chronic obstructive pulmonary disease) (HCC)    Depression    Diabetes mellitus    GERD (gastroesophageal reflux disease)    Hyperlipidemia    Hypertension    Low back pain    Obstructive sleep apnea    Pneumonia 2010   Post-menopausal atrophic vaginitis    Superficial phlebitis    Past Surgical History:  Procedure Laterality Date   BACK SURGERY  1990   CESAREAN SECTION     CYST REMOVAL HAND     inner thigh   REFRACTIVE SURGERY Right 06/07/2022   SPINE SURGERY     lumbar laminectomy and diskectomy    Family History  Problem Relation Age of Onset   Diabetes Mother    Heart disease Mother    Cancer Father    Diabetes Sister    Stroke Sister  Lung disease Neg Hx    Social History   Socioeconomic History   Marital status: Single    Spouse name: Not on file   Number of children: Not on file   Years of education: Not on file   Highest education level: Not on file  Occupational History   Not on file  Tobacco Use   Smoking status: Former    Packs/day: 1.50    Years: 15.00    Total pack years: 22.50    Types: Cigarettes    Quit date: 03/20/2009    Years since quitting: 13.2   Smokeless tobacco: Never  Vaping Use   Vaping Use: Never used  Substance and Sexual Activity   Alcohol use: No   Drug use: No   Sexual activity: Not Currently    Partners: Male  Other Topics Concern   Not on file  Social History  Narrative   Not on file   Social Determinants of Health   Financial Resource Strain: High Risk (03/05/2022)   Overall Financial Resource Strain (CARDIA)    Difficulty of Paying Living Expenses: Hard  Food Insecurity: No Food Insecurity (02/05/2022)   Hunger Vital Sign    Worried About Running Out of Food in the Last Year: Never true    Ran Out of Food in the Last Year: Never true  Transportation Needs: No Transportation Needs (03/05/2022)   PRAPARE - Hydrologist (Medical): No    Lack of Transportation (Non-Medical): No  Physical Activity: Inactive (02/05/2022)   Exercise Vital Sign    Days of Exercise per Week: 0 days    Minutes of Exercise per Session: 0 min  Stress: Not on file  Social Connections: Unknown (02/05/2022)   Social Connection and Isolation Panel [NHANES]    Frequency of Communication with Friends and Family: More than three times a week    Frequency of Social Gatherings with Friends and Family: More than three times a week    Attends Religious Services: 1 to 4 times per year    Active Member of [employer] or Organizations: No    Attends Archivist Meetings: Never    Marital Status: Not on file    Review of Systems  Constitutional:  Negative for chills, fatigue and fever.  HENT:  Negative for congestion, ear pain and sore throat.   Respiratory:  Negative for cough and shortness of breath.   Cardiovascular:  Negative for chest pain and palpitations.  Gastrointestinal:  Negative for abdominal pain, constipation, diarrhea, nausea and vomiting.  Endocrine: Negative for polydipsia, polyphagia and polyuria.  Genitourinary:  Negative for difficulty urinating and dysuria.  Musculoskeletal:  Positive for arthralgias and back pain. Negative for myalgias.  Skin:  Negative for rash.  Neurological:  Negative for headaches.  Psychiatric/Behavioral:  Negative for dysphoric mood. The patient is not nervous/anxious.     Objective:  BP 104/62    Pulse 85   Temp 98.1 F (36.7 C)   Resp 15   Ht '5\' 3"'$  (1.6 m)   Wt 204 lb (92.5 kg)   SpO2 99%   BMI 36.14 kg/m      06/11/2022   10:48 AM 05/24/2022    9:25 AM 03/07/2022    1:33 PM  BP/Weight  Systolic BP [phone]  Diastolic BP 62 60 74  Wt. (Lbs) 204 199 212  BMI 36.14 kg/m2 35.25 kg/m2 37.55 kg/m2    Physical Exam Vitals reviewed.  Constitutional:  Appearance: Normal appearance. She is obese.  Neck:     Vascular: No carotid bruit.  Cardiovascular:     Rate and Rhythm: Normal rate and regular rhythm.     Heart sounds: Normal heart sounds.  Pulmonary:     Effort: Pulmonary effort is normal. No respiratory distress.     Breath sounds: Normal breath sounds.  Abdominal:     General: Abdomen is flat. Bowel sounds are normal.     Palpations: Abdomen is soft.     Tenderness: There is no abdominal tenderness.  Skin:    Findings: Rash (stasis dermatitis.) present.  Neurological:     Mental Status: She is alert and oriented to person, place, and time.  Psychiatric:        Mood and Affect: Mood normal.        Behavior: Behavior normal.     Diabetic Foot Exam - Simple   Simple Foot Form Diabetic Foot exam was performed with the following findings: Yes 06/11/2022 11:26 AM  Visual Inspection No deformities, no ulcerations, no other skin breakdown bilaterally: Yes Sensation Testing Intact to touch and monofilament testing bilaterally: Yes Pulse Check Posterior Tibialis and Dorsalis pulse intact bilaterally: Yes Comments      Lab Results  Component Value Date   WBC 14.0 (H) 02/27/2022   HGB 15.4 02/27/2022   HCT 46.2 02/27/2022   PLT 233 02/27/2022   GLUCOSE 105 (H) 02/27/2022   CHOL 125 02/27/2022   TRIG 69 02/27/2022   HDL 58 02/27/2022   LDLCALC 53 02/27/2022   ALT 12 02/27/2022   AST 11 02/27/2022   NA 143 02/27/2022   K 3.6 02/27/2022   CL 104 02/27/2022   CREATININE 0.71 02/27/2022   BUN 24 02/27/2022   CO2 23 02/27/2022   TSH 1.500  11/22/2021   HGBA1C 6.7 (H) 02/27/2022   MICROALBUR 30 05/18/2021      Assessment & Plan:   Problem List Items Addressed This Visit       Cardiovascular and Mediastinum   Hypertension associated with diabetes ([place])    Well controlled.  Continue ramipril 2.5 mg daily.       Relevant Medications   dapagliflozin propanediol (FARXIGA) 10 MG TABS tablet   Other Relevant Orders   Comprehensive metabolic panel   CBC with Differential/Platelet     Respiratory   COPD (chronic obstructive pulmonary disease) (HCC)    Stable. The current medical regimen is effective;  continue present plan and medications.         Endocrine   Diabetic polyneuropathy associated with type 2 diabetes mellitus (HCC)    Control: good Recommend check feet daily. Recommend annual eye exams. Medicines: Continue Tresiba 30 units daily, Metformin 1000 mg twice a day, Actos 30 mg daily, Farxiga 10 mg daily, Ozempic 2 mg weekly. Continue to work on eating a healthy diet and exercise.  Labs drawn today.         Relevant Medications   dapagliflozin propanediol (FARXIGA) 10 MG TABS tablet   Other Relevant Orders   Hemoglobin A1c     Other   Hyperlipidemia    Well controlled.  No changes to medicines. Continue pravastatin 40 mg daily.  Continue to work on eating a healthy diet and exercise.  Labs drawn today.        Relevant Orders   Lipid panel   Severe obesity with body mass index (BMI) of 36.0 to 36.9 with serious comorbidity ([place])    Recommend continue to work  on eating healthy diet and exercise. Comorbidities: diabetes, hyperlipidemia.       Relevant Medications   dapagliflozin propanediol (FARXIGA) 10 MG TABS tablet   Encounter for mammogram to establish baseline mammogram   Relevant Orders   MM DIGITAL SCREENING BILATERAL   Need for influenza vaccination - Primary   Relevant Orders   Flu Vaccine MDCK QUAD PF (Completed)  .  Meds ordered this encounter  Medications   dapagliflozin  propanediol (FARXIGA) 10 MG TABS tablet    Sig: Take 1 tablet (10 mg total) by mouth daily before breakfast.    Dispense:  90 tablet    Refill:  3    Orders Placed This Encounter  Procedures   MM DIGITAL SCREENING BILATERAL   Flu Vaccine MDCK QUAD PF   Comprehensive metabolic panel   Hemoglobin A1c   Lipid panel   CBC with Differential/Platelet     Follow-up: Return in about 4 months (around 10/11/2022) for chronic fasting.  An After Visit Summary was printed and given to the patient.  [name], MD [facility] [phone]

## 2022-06-11 NOTE — Assessment & Plan Note (Signed)
Well controlled.  No changes to medicines. Continue pravastatin 40 mg daily.  Continue to work on eating a healthy diet and exercise.  Labs drawn today.

## 2022-06-12 LAB — COMPREHENSIVE METABOLIC PANEL
ALT: 16 IU/L (ref 0–32)
AST: 14 IU/L (ref 0–40)
Albumin/Globulin Ratio: 1.9 (ref 1.2–2.2)
Albumin: 4.2 g/dL (ref 3.9–4.9)
Alkaline Phosphatase: 44 IU/L (ref 44–121)
BUN/Creatinine Ratio: 31 — ABNORMAL HIGH (ref 12–28)
BUN: 21 mg/dL (ref 8–27)
Bilirubin Total: 0.4 mg/dL (ref 0.0–1.2)
CO2: 23 mmol/L (ref 20–29)
Calcium: 9.5 mg/dL (ref 8.7–10.3)
Chloride: 107 mmol/L — ABNORMAL HIGH (ref 96–106)
Creatinine, Ser: 0.68 mg/dL (ref 0.57–1.00)
Globulin, Total: 2.2 g/dL (ref 1.5–4.5)
Glucose: 93 mg/dL (ref 70–99)
Potassium: 4.6 mmol/L (ref 3.5–5.2)
Sodium: 143 mmol/L (ref 134–144)
Total Protein: 6.4 g/dL (ref 6.0–8.5)
eGFR: 98 mL/min/{1.73_m2} (ref >59)

## 2022-06-12 LAB — HEMOGLOBIN A1C
Est. average glucose Bld gHb Est-mCnc: 146 mg/dL
Hgb A1c MFr Bld: 6.7 % — ABNORMAL HIGH (ref 4.8–5.6)

## 2022-06-12 LAB — CBC WITH DIFFERENTIAL/PLATELET
Basophils Absolute: 0 10*3/uL (ref 0.0–0.2)
Basos: 0 %
EOS (ABSOLUTE): 0.2 10*3/uL (ref 0.0–0.4)
Eos: 2 %
Hematocrit: 42.7 % (ref 34.0–46.6)
Hemoglobin: 14.1 g/dL (ref 11.1–15.9)
Immature Grans (Abs): 0 10*3/uL (ref 0.0–0.1)
Immature Granulocytes: 0 %
Lymphocytes Absolute: 2.7 10*3/uL (ref 0.7–3.1)
Lymphs: 25 %
MCH: 28.8 pg (ref 26.6–33.0)
MCHC: 33 g/dL (ref 31.5–35.7)
MCV: 87 fL (ref 79–97)
Monocytes Absolute: 0.7 10*3/uL (ref 0.1–0.9)
Monocytes: 6 %
Neutrophils Absolute: 7.3 10*3/uL — ABNORMAL HIGH (ref 1.4–7.0)
Neutrophils: 67 %
Platelets: 246 10*3/uL (ref 150–450)
RBC: 4.9 x10E6/uL (ref 3.77–5.28)
RDW: 14.3 % (ref 11.7–15.4)
WBC: 11 10*3/uL — ABNORMAL HIGH (ref 3.4–10.8)

## 2022-06-12 LAB — LIPID PANEL
Chol/HDL Ratio: 2.2 ratio (ref 0.0–4.4)
Cholesterol, Total: 136 mg/dL (ref 100–199)
HDL: 62 mg/dL (ref >39)
LDL Chol Calc (NIH): 63 mg/dL (ref 0–99)
Triglycerides: 51 mg/dL (ref 0–149)
VLDL Cholesterol Cal: 11 mg/dL (ref 5–40)

## 2022-06-12 LAB — CARDIOVASCULAR RISK ASSESSMENT

## 2022-06-12 NOTE — Progress Notes (Signed)
Blood count abnormal. Wbc improved, but not quite at goal.  Liver function normal.  Kidney function normal.  Cholesterol: good HBA1C: 6.7. stable.

## 2022-06-13 ENCOUNTER — Ambulatory Visit: Payer: Medicare HMO | Admitting: Internal Medicine

## 2022-06-19 ENCOUNTER — Telehealth: Payer: Self-pay

## 2022-06-19 NOTE — Chronic Care Management (AMB) (Signed)
[employer] patient assistance program notification:  120- day supply of Novofine needles, Tresiba 200 u/ml and Ozempic 2 mg will be filled on 06/27/2022 and should arrive to the office in 10-14 business days.      Pattricia Boss, [place] Pharmacist Assistant [phone]

## 2022-06-20 ENCOUNTER — Telehealth: Payer: Self-pay

## 2022-06-20 NOTE — Progress Notes (Signed)
Chronic Care Management Pharmacy Assistant   Name: Brandi Velazquez  MRN: [id number] DOB: November 03, 1960   Reason for Encounter: Medication Coordination for [facility]    Recent office visits:  06/11/22 Brandi Brome MD. Seen for routine visit. D/C Aspirin '81mg'$ , Breztri Inhaler, Cetirizine '10mg'$  and Spironolactone '25mg'$ .  05/24/22 Velazquez, Kirsten MD. Seen for knee pain. Started on Naproxen '500mg'$ .  Recent consult visits:  None  Hospital visits:  None  Medications: Outpatient Encounter Medications as of 06/20/2022  Medication Sig   Continuous Blood Gluc Sensor (DEXCOM G6 SENSOR) MISC Check sugars qac and qhs. (Patient not taking: Reported on 06/11/2022)   dapagliflozin propanediol (FARXIGA) 10 MG TABS tablet Take 1 tablet (10 mg total) by mouth daily before breakfast.   fluticasone (FLONASE) 50 MCG/ACT nasal spray Place 2 sprays into both nostrils daily.   glucose blood (RELION GLUCOSE TEST STRIPS) test strip Use as instructed   insulin degludec (TRESIBA FLEXTOUCH) 200 UNIT/ML FlexTouch Pen Inject 30 Units into the skin daily.   metFORMIN (GLUCOPHAGE) 1000 MG tablet TAKE ONE TABLET BY MOUTH TWICE DAILY WITH A MEAL   montelukast (SINGULAIR) 10 MG tablet Take 1 tablet (10 mg total) by mouth at bedtime.   Multiple Vitamin (MULTIVITAMIN) tablet Take 1 tablet by mouth daily.   naproxen (NAPROSYN) 500 MG tablet Take 1 tablet (500 mg total) by mouth 2 (two) times daily with a meal.   omeprazole (PRILOSEC) 40 MG capsule Take 1 capsule (40 mg total) by mouth daily.   pioglitazone (ACTOS) 30 MG tablet Take 1 tablet (30 mg total) by mouth daily.   pravastatin (PRAVACHOL) 40 MG tablet TAKE ONE TABLET BY MOUTH EVERYDAY AT BEDTIME   ramipril (ALTACE) 2.5 MG capsule TAKE ONE CAPSULE BY MOUTH EVERY MORNING   Semaglutide (OZEMPIC, 2 MG/DOSE, [place]) Inject 2 mg into the skin once a week.   No facility-administered encounter medications on file as of 06/20/2022.    Reviewed chart for medication changes ahead of  medication coordination call.  No Consults, or hospital visits since last care coordination call/Pharmacist visit.    BP Readings from Last 3 Encounters:  06/11/22 104/62  05/24/22 100/60  03/07/22 122/74    Lab Results  Component Value Date   HGBA1C 6.7 (H) 06/11/2022     Patient obtains medications through Adherence Packaging  90 Days   Last adherence delivery included:  Metformin HCI '1000mg'$  1 B, 1 EM Spironolactone '25mg'$  1 BB Pravastatin '40mg'$  1 BT Ramipril 2.'5mg'$  1 B Pioglitazone HCI '30mg'$  1 BB  Patient declined (meds) last delivery Patient gets Patient Assistance for the following: Farxiga '10mg'$  Ozempic 2 mg Tresiba 200 units Breztri- Received a supply through [facility] 03/08/22 30ds Albuterol Inhaler- Only uses prn, does not need last filled 10/03/21 25ds Glimepiride '4mg'$  -Pt reported not taking since she is on the [place]. Pt is now taking Ozempic  Flonase Nasal Spray- Does not need at this moment   Patient is due for next adherence delivery on: 07/02/22. Called patient and reviewed medications and coordinated delivery.  This delivery to include: Metformin HCI '1000mg'$  1 B, 1 EM Spironolactone '25mg'$  1 BB Pravastatin '40mg'$  1 BT Ramipril 2.'5mg'$  1 B Pioglitazone HCI '30mg'$  1 BB  Patient declined the following medications  Flonase Nasal Spray- Does not need at this moment  Omeprazole '40mg'$ - Filled 02/27/22 90ds at [facility]  Montelukast '10mg'$ -Received at [facility] 05/29/22 90ds Naproxen '500mg'$ -Received at [facility] 05/24/22 30ds Patient gets Patient Assistance for the following: Farxiga '10mg'$  Ozempic 2 mg Tresiba 200 units  Patient needs refills-Request Sent  Pioglitazone HCI '30mg'$    Confirmed delivery date of 07/02/22, advised patient that pharmacy will contact them the morning of delivery.   Brandi Velazquez, [place] Pharmacist Assistant  [phone]

## 2022-06-21 ENCOUNTER — Other Ambulatory Visit: Payer: Self-pay

## 2022-06-21 MED ORDER — PIOGLITAZONE HCL 30 MG PO TABS: 30.0000 mg | ORAL_TABLET | Freq: Every day | ORAL | 1 refills | Status: DC

## 2022-06-22 DIAGNOSIS — H40013 Open angle with borderline findings, low risk, bilateral: Secondary | ICD-10-CM | POA: Diagnosis not present

## 2022-06-22 DIAGNOSIS — H25813 Combined forms of age-related cataract, bilateral: Secondary | ICD-10-CM | POA: Diagnosis not present

## 2022-06-22 DIAGNOSIS — H21233 Degeneration of iris (pigmentary), bilateral: Secondary | ICD-10-CM | POA: Diagnosis not present

## 2022-06-25 ENCOUNTER — Telehealth: Payer: Self-pay

## 2022-06-25 NOTE — Telephone Encounter (Signed)
Gave patient 21 days worth of farxiga 10 mg samples because patient states she is out of the medication and that she is gonna have to re new her patient assistance and asked if we could print out the application for her which has been done and placed up front and samples have been put out for patient too as well.

## 2022-06-28 ENCOUNTER — Ambulatory Visit: Payer: Medicare HMO | Admitting: Internal Medicine

## 2022-07-06 ENCOUNTER — Telehealth: Payer: Self-pay

## 2022-07-06 DIAGNOSIS — H401131 Primary open-angle glaucoma, bilateral, mild stage: Secondary | ICD-10-CM | POA: Diagnosis not present

## 2022-07-06 DIAGNOSIS — H401111 Primary open-angle glaucoma, right eye, mild stage: Secondary | ICD-10-CM | POA: Diagnosis not present

## 2022-07-06 DIAGNOSIS — Z01818 Encounter for other preprocedural examination: Secondary | ICD-10-CM | POA: Diagnosis not present

## 2022-07-06 DIAGNOSIS — H25811 Combined forms of age-related cataract, right eye: Secondary | ICD-10-CM | POA: Diagnosis not present

## 2022-07-06 DIAGNOSIS — E119 Type 2 diabetes mellitus without complications: Secondary | ICD-10-CM | POA: Diagnosis not present

## 2022-07-06 NOTE — Chronic Care Management (AMB) (Signed)
Faxed completed patient [place] patient assistance application for [place] assistance.  Brandi Velazquez, [place] Pharmacist Assistant [phone]

## 2022-07-11 ENCOUNTER — Ambulatory Visit
Admission: RE | Admit: 2022-07-11 | Discharge: 2022-07-11 | Disposition: A | Discharge status: Home / Self Care | Payer: Medicare HMO | Source: Ambulatory Visit | Attending: Family Medicine | Admitting: Family Medicine

## 2022-07-11 DIAGNOSIS — Z1231 Encounter for screening mammogram for malignant neoplasm of breast: Secondary | ICD-10-CM | POA: Diagnosis not present

## 2022-07-17 DIAGNOSIS — H2511 Age-related nuclear cataract, right eye: Secondary | ICD-10-CM | POA: Diagnosis not present

## 2022-07-17 DIAGNOSIS — H25811 Combined forms of age-related cataract, right eye: Secondary | ICD-10-CM | POA: Diagnosis not present

## 2022-07-17 DIAGNOSIS — E1139 Type 2 diabetes mellitus with other diabetic ophthalmic complication: Secondary | ICD-10-CM | POA: Diagnosis not present

## 2022-07-17 DIAGNOSIS — Z7985 Long-term (current) use of injectable non-insulin antidiabetic drugs: Secondary | ICD-10-CM | POA: Diagnosis not present

## 2022-07-17 DIAGNOSIS — H401110 Primary open-angle glaucoma, right eye, stage unspecified: Secondary | ICD-10-CM | POA: Diagnosis not present

## 2022-07-17 DIAGNOSIS — H401111 Primary open-angle glaucoma, right eye, mild stage: Secondary | ICD-10-CM | POA: Diagnosis not present

## 2022-07-17 DIAGNOSIS — Z7984 Long term (current) use of oral hypoglycemic drugs: Secondary | ICD-10-CM | POA: Diagnosis not present

## 2022-07-17 DIAGNOSIS — E119 Type 2 diabetes mellitus without complications: Secondary | ICD-10-CM | POA: Diagnosis not present

## 2022-07-17 DIAGNOSIS — E1136 Type 2 diabetes mellitus with diabetic cataract: Secondary | ICD-10-CM | POA: Diagnosis not present

## 2022-07-26 DIAGNOSIS — E113293 Type 2 diabetes mellitus with mild nonproliferative diabetic retinopathy without macular edema, bilateral: Secondary | ICD-10-CM | POA: Diagnosis not present

## 2022-07-26 DIAGNOSIS — H33321 Round hole, right eye: Secondary | ICD-10-CM | POA: Diagnosis not present

## 2022-07-27 IMAGING — CT CT CHEST W/O CM
2 of 3 series · 15 of 36 positions shown, 18 images · non-contrast
Comparison: None.

CLINICAL DATA: Persistent cough.



[Series 2: thorax · axial · 0.77mm/px · z∈[-287,-47]mm · 12 of 142 slices shown, 15 images]
[im 11/142  mediastinal]
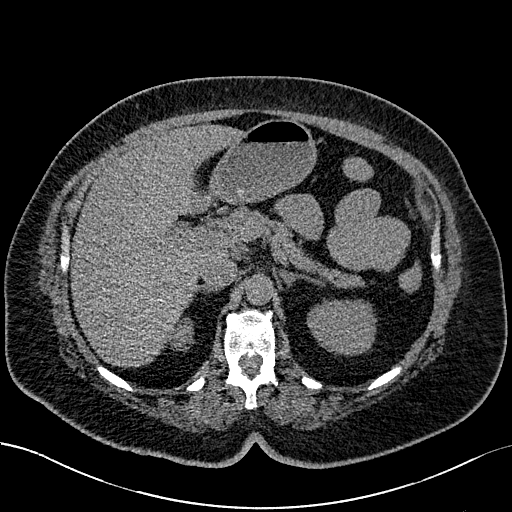
[im 11/142  lung]
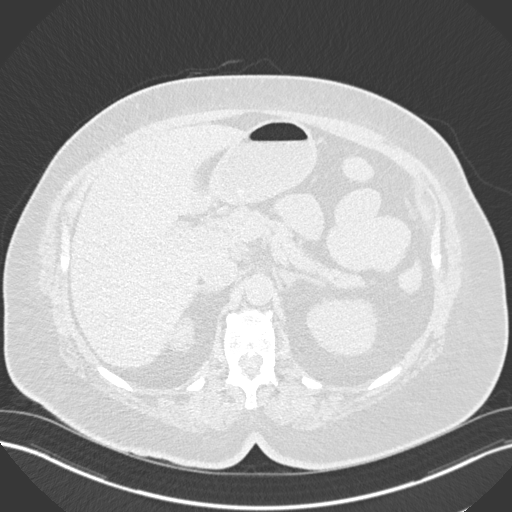
[im 21/142  lung]
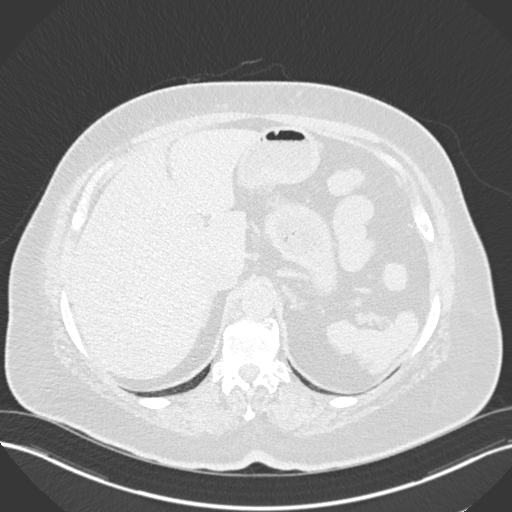
[im 32/142  lung]
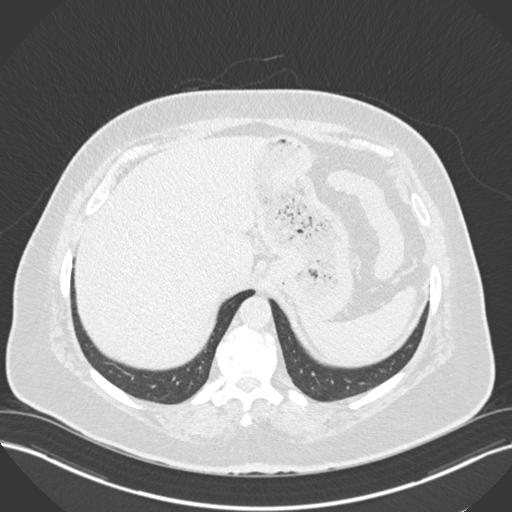
[im 42/142  lung]
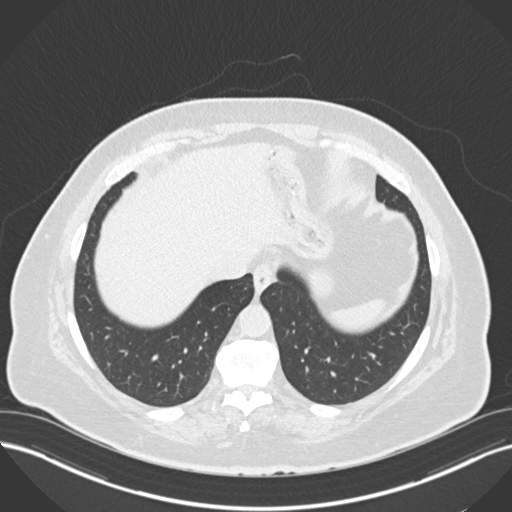
[im 53/142  mediastinal]
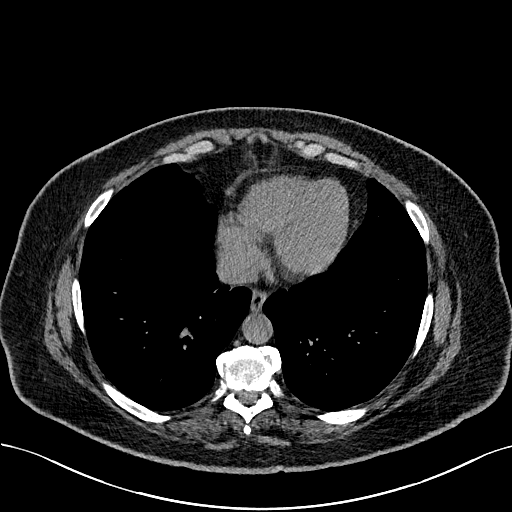
[im 53/142  lung]
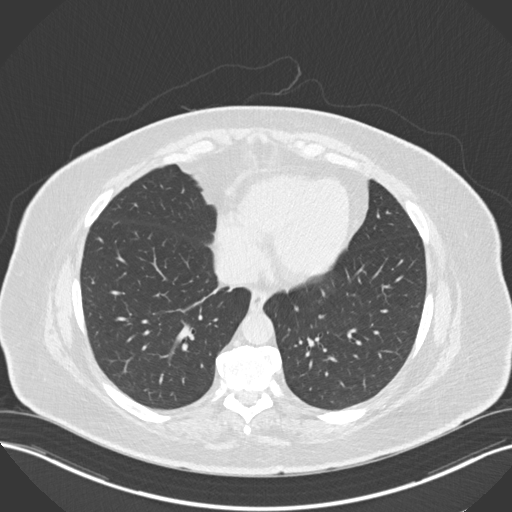
[im 63/142  lung]
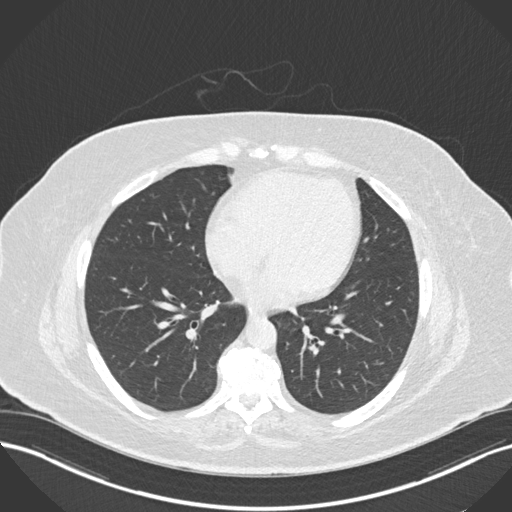
[im 79/142  lung]
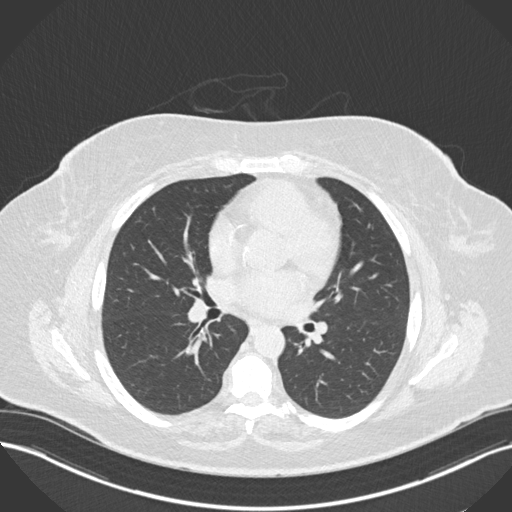
[im 89/142  lung]
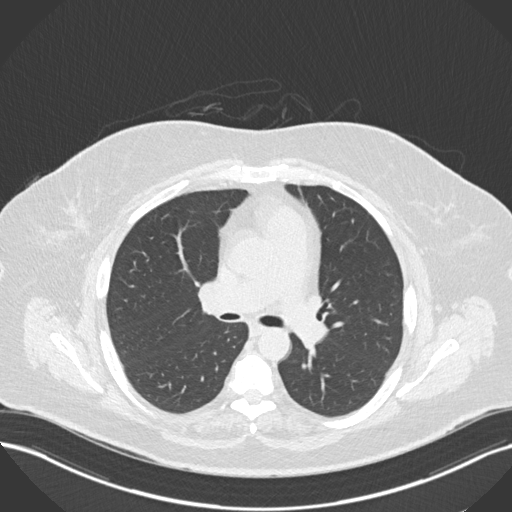
[im 100/142  mediastinal]
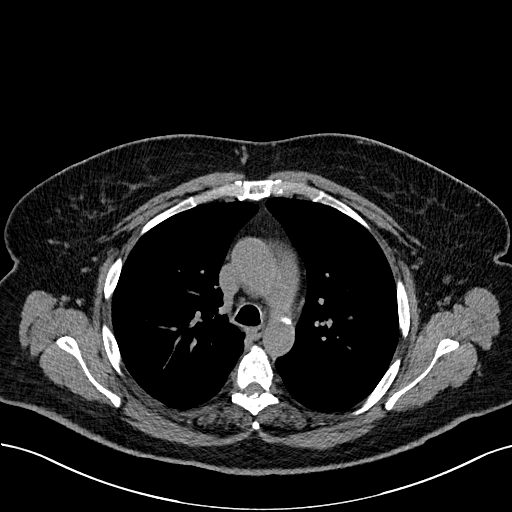
[im 100/142  lung]
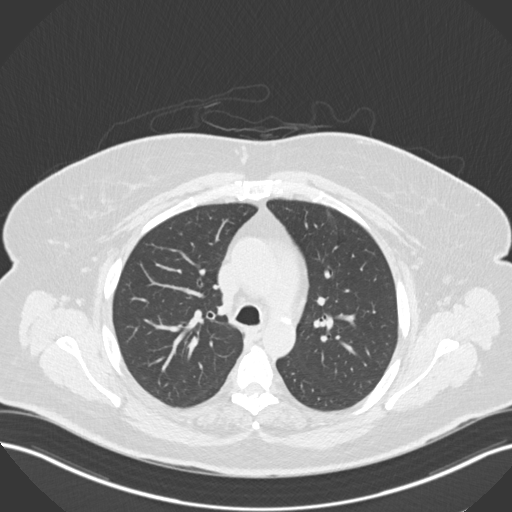
[im 110/142  lung]
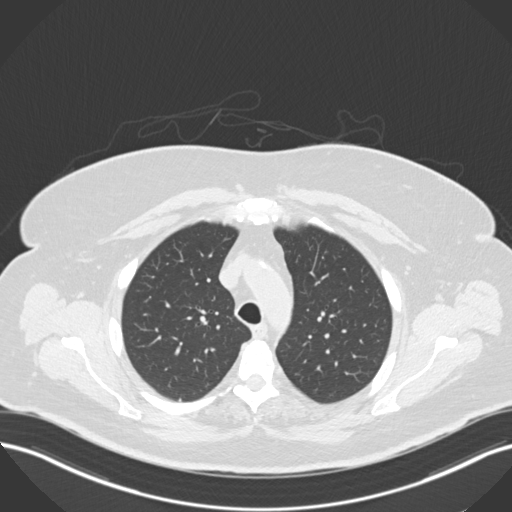
[im 121/142  lung]
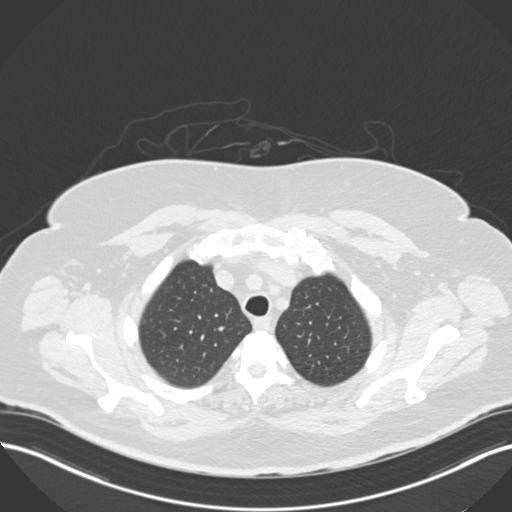
[im 131/142  lung]
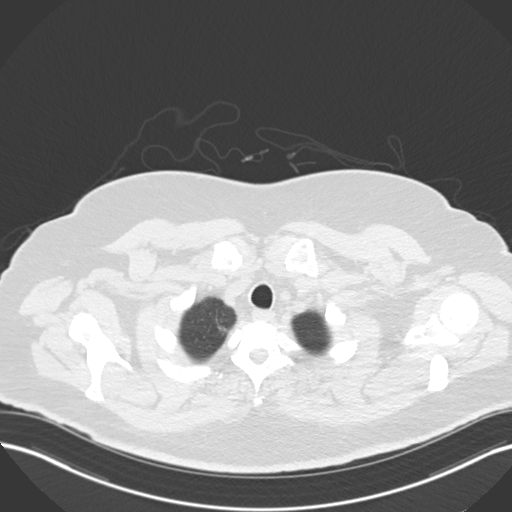

[Series 5: coronal · coronal · 0.59mm/px · 3 of 139 slices shown]
[im 28/139  lung]
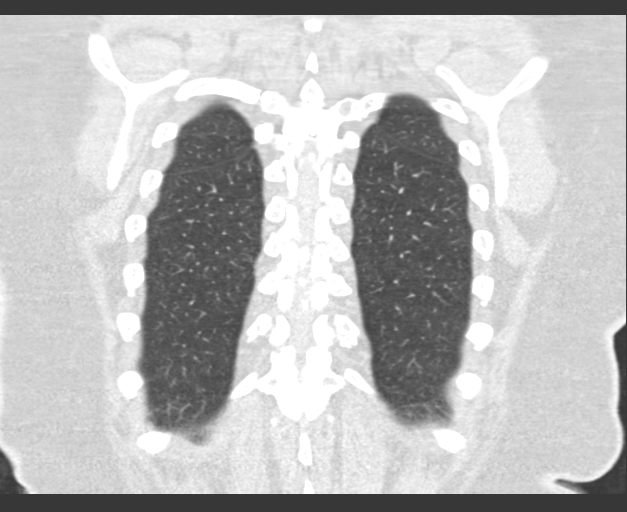
[im 56/139  lung]
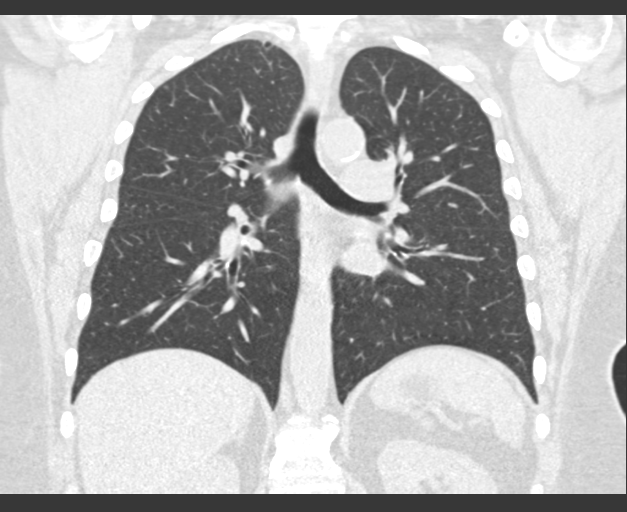
[im 83/139  lung]
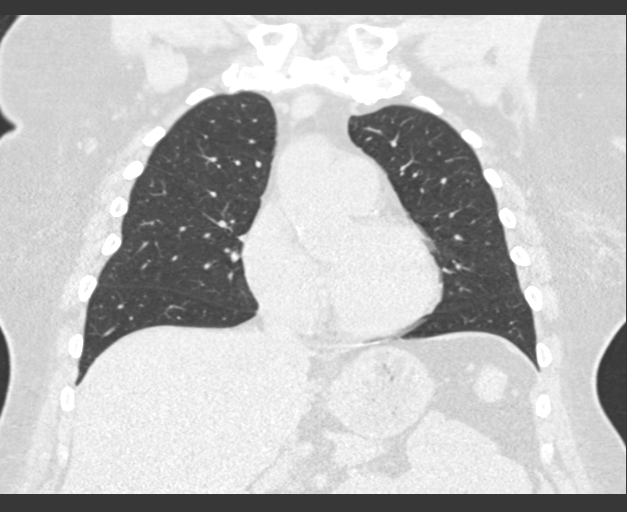

[15 of 36 positions shown; findings below may reference images not displayed]

FINDINGS: Cardiovascular: No significant vascular findings. Normal heart size.
No pericardial effusion. There are atherosclerotic calcifications of
the aorta and coronary arteries.

Mediastinum/Nodes: No enlarged mediastinal or axillary lymph nodes.
Thyroid gland, trachea, and esophagus demonstrate no significant
findings.

Lungs/Pleura: There are minimal emphysematous changes and scarring
in the right lung apex. There is linear atelectasis or scarring in
the right lower lobe. The lungs are otherwise clear. No pleural
effusion or pneumothorax. Trachea and central airways are patent.
There is a calcified granuloma in the right lower lobe.

Upper Abdomen: No acute abnormality.

Musculoskeletal: No chest wall mass or suspicious bone lesions
identified.
IMPRESSION: No acute cardiopulmonary process.

Minimal emphysema.

Aortic Atherosclerosis ([id number]).

1.

## 2022-08-13 ENCOUNTER — Telehealth: Payer: Medicare HMO

## 2022-08-14 ENCOUNTER — Telehealth: Payer: Self-pay

## 2022-08-14 NOTE — Telephone Encounter (Signed)
We have returned mail. I spoke with the patient last week and verified that the address on file is correct. I send the mail back to the patient. I left a message requesting for the patient to come to the office to pick it up mail.

## 2022-08-15 ENCOUNTER — Ambulatory Visit (INDEPENDENT_AMBULATORY_CARE_PROVIDER_SITE_OTHER): Payer: Medicare HMO

## 2022-08-15 DIAGNOSIS — E1159 Type 2 diabetes mellitus with other circulatory complications: Secondary | ICD-10-CM

## 2022-08-15 DIAGNOSIS — E782 Mixed hyperlipidemia: Secondary | ICD-10-CM

## 2022-08-15 DIAGNOSIS — E1142 Type 2 diabetes mellitus with diabetic polyneuropathy: Secondary | ICD-10-CM

## 2022-08-15 NOTE — Progress Notes (Signed)
**Note Brandi Velazquez** Chronic Care Management Pharmacy Note  08/15/2022 Name:  Brandi Velazquez MRN:  [id number] DOB:  08-Dec-1960    Summary:  Pleasant 61 year old female presents for f/u CCM visit. Her father was in the [place] so she's travelled all across the country but [place] is her favourite. She has 1 daughter and 10 year old twin grandkids. They all live with her. Patient works part-time with mentally handicapped during the week and the daughter works 12 hours at the hospital on the weekend  Plan Recommendations:  Counseled patient to start checking sugars weekly (She doesn't wish to do daily) Continue PAP process (See CP)   Subjective: Brandi Velazquez is an 61 y.o. year old female who is a primary patient of [name], Kirsten, MD.  The CCM team was consulted for assistance with disease management and care coordination needs.    Engaged with patient by telephone for follow up visit in response to provider referral for pharmacy case management and/or care coordination services.   Consent to Services:  The patient was given information about Chronic Care Management services, agreed to services, and gave verbal consent prior to initiation of services.  Please see initial visit note for detailed documentation.   Patient Care Team: [name], MD as PCP - General (Family Medicine) Lane Hacker, [facility] (Pharmacist)  Recent office visits:  06/11/22 [name] MD. Seen for routine visit. D/C Aspirin 50m, Breztri Inhaler, Cetirizine 174mand Spironolactone 2570m  05/24/22 CoxRochel Brome. Seen for knee pain. Started on Naproxen 500m65m Recent consult visits:  None   Hospital visits:  None  Objective:  Lab Results  Component Value Date   CREATININE 0.68 06/11/2022   BUN 21 06/11/2022   GFRNONAA 91 10/27/2020   GFRAA 105 10/27/2020   NA 143 06/11/2022   K 4.6 06/11/2022   CALCIUM 9.5 06/11/2022   CO2 23 06/11/2022   GLUCOSE 93 06/11/2022    Lab Results  Component Value Date/Time   HGBA1C  6.7 (H) 06/11/2022 11:34 AM   HGBA1C 6.7 (H) 02/27/2022 10:41 AM   MICROALBUR 30 05/18/2021 10:31 AM   MICROALBUR 30 07/24/2020 11:45 PM    Last diabetic Eye exam:  Lab Results  Component Value Date/Time   HMDIABEYEEXA No Retinopathy 03/08/2022 12:00 AM    Last diabetic Foot exam: No results found for: "HMDIABFOOTEX"   Lab Results  Component Value Date   CHOL 136 06/11/2022   HDL 62 06/11/2022   LDLCALC 63 06/11/2022   TRIG 51 06/11/2022   CHOLHDL 2.2 06/11/2022       Latest Ref Rng & Units 06/11/2022   11:34 AM 02/27/2022   10:41 AM 11/22/2021   10:58 AM  Hepatic Function  Total Protein 6.0 - 8.5 g/dL 6.4  6.0  7.0   Albumin 3.9 - 4.9 g/dL 4.2  4.6  4.6   AST 0 - 40 IU/L _0 ALT 0 - 32 IU/L _1 Alk Phosphatase 44 - 121 IU/L 44  61  55   Total Bilirubin 0.0 - 1.2 mg/dL 0.4  0.4  0.4     Lab Results  Component Value Date/Time   TSH 1.500 11/22/2021 10:58 AM       Latest Ref Rng & Units 06/11/2022   11:34 AM 02/27/2022   10:41 AM 11/22/2021   10:58 AM  CBC  WBC 3.4 - 10.8 x10E3/uL 11.0  14.0  11.5  Hemoglobin 11.1 - 15.9 g/dL 14.1  15.4  15.3   Hematocrit 34.0 - 46.6 % 42.7  46.2  45.7   Platelets 150 - 450 x10E3/uL 246  233  252     No results found for: "VD25OH"  Clinical ASCVD: No  The 10-year ASCVD risk score (Arnett DK, et al., 2019) is: 5%   Values used to calculate the score:     Age: 51 years     Sex: Female     Is Non-Hispanic African American: No     Diabetic: Yes     Tobacco smoker: No     Systolic Blood Pressure: 127 mmHg     Is BP treated: Yes     HDL Cholesterol: 62 mg/dL     Total Cholesterol: 136 mg/dL       02/05/2022    3:16 PM 07/18/2021   11:21 AM 07/18/2020   10:09 AM  Depression screen PHQ 2/9  Decreased Interest 0 0 0  Down, Depressed, Hopeless 0 0 0  PHQ - 2 Score 0 0 0     Social History   Tobacco Use  Smoking Status Former   Packs/day: 1.50   Years: 15.00   Total pack years: 22.50   Types: Cigarettes    Quit date: 03/20/2009   Years since quitting: 13.4  Smokeless Tobacco Never   BP Readings from Last 3 Encounters:  06/11/22 104/62  05/24/22 100/60  03/07/22 122/74   Pulse Readings from Last 3 Encounters:  06/11/22 85  05/24/22 74  03/07/22 79   Wt Readings from Last 3 Encounters:  06/11/22 204 lb (92.5 kg)  05/24/22 199 lb (90.3 kg)  03/07/22 212 lb (96.2 kg)   BMI Readings from Last 3 Encounters:  06/11/22 36.14 kg/m  05/24/22 35.25 kg/m  03/07/22 37.55 kg/m    Assessment/Interventions: Review of patient past medical history, allergies, medications, health status, including review of consultants reports, laboratory and other test data, was performed as part of comprehensive evaluation and provision of chronic care management services.   SDOH:  (Social Determinants of Health) assessments and interventions performed: Yes SDOH Interventions    Flowsheet Row Chronic Care Management from 08/15/2022 in [facility] from 03/05/2022 in [place] from 02/05/2022 in [place] Interventions     Food Insecurity Interventions -- -- Intervention Not Indicated  Housing Interventions -- -- Intervention Not Indicated  Transportation Interventions Intervention Not Indicated Intervention Not Indicated --  Financial Strain Interventions Other (Comment)  [PAP] Other (Comment)  [PAP (See CP)] --  Physical Activity Interventions -- -- Patient Refused  Social Connections Interventions -- -- Intervention Not Indicated       CCM Care Plan  Allergies  Allergen Reactions   Bactrim [Sulfamethoxazole-Trimethoprim] Swelling   Morphine And Related     Medications Reviewed Today     Reviewed by Lane Hacker, [facility] (Pharmacist) on 08/15/22 at 1012  Med List Status: <None>   Medication Order Taking? Sig Documenting Provider Last Dose Status Informant  Continuous Blood Gluc Sensor (DEXCOM G6 SENSOR) MISC [id number]  No Check sugars qac and qhs.  Patient not taking: Reported on 06/11/2022   CoxElnita Maxwell, MD Not Taking Active   dapagliflozin propanediol (FARXIGA) 10 MG TABS tablet [id number]  Take 1 tablet (10 mg total) by mouth daily before breakfast. [name], Kirsten, MD  Active   fluticasone [facility]) 50 MCG/ACT nasal spray [id number] No Place 2 sprays into both nostrils daily.  [name], Kirsten, MD Taking Active   glucose blood (RELION GLUCOSE TEST STRIPS) test strip [id number] No Use as instructed [name], Kirsten, MD Taking Active   insulin degludec (TRESIBA FLEXTOUCH) 200 UNIT/ML FlexTouch Pen [id number] No Inject 30 Units into the skin daily. [name], MD Taking Active            Med Note Thompson Caul I   Thu May 24, 2022  9:29 AM)    metFORMIN (GLUCOPHAGE) 1000 MG tablet [id number] No TAKE ONE TABLET BY MOUTH TWICE DAILY WITH A MEAL Montez Morita, Thornell Mule, NP Taking Active   montelukast (SINGULAIR) 10 MG tablet [id number] No Take 1 tablet (10 mg total) by mouth at bedtime. [name], Kirsten, MD Taking Active   Multiple Vitamin (MULTIVITAMIN) tablet [id number] No Take 1 tablet by mouth daily. [provider] Taking Active   naproxen (NAPROSYN) 500 MG tablet [id number] No Take 1 tablet (500 mg total) by mouth 2 (two) times daily with a meal. [name], Kirsten, MD Taking Active   omeprazole (PRILOSEC) 40 MG capsule [id number] No Take 1 capsule (40 mg total) by mouth daily. [name], Kirsten, MD Taking Active   pioglitazone (ACTOS) 30 MG tablet [id number]  Take 1 tablet (30 mg total) by mouth daily. [name], Kirsten, MD  Active   pravastatin (PRAVACHOL) 40 MG tablet [id number] No TAKE ONE TABLET BY MOUTH EVERYDAY AT BEDTIME [name], Kirsten, MD Taking Active   ramipril (ALTACE) 2.5 MG capsule [id number] No TAKE ONE CAPSULE BY MOUTH EVERY MORNING [name], Kirsten, MD Taking Active   Semaglutide [facility], 2 MG/DOSE, [place]) [id number] No Inject 2 mg into the skin once a week. [provider] Taking Active             Patient Active  Problem List   Diagnosis Date Noted   Encounter for mammogram to establish baseline mammogram 06/11/2022   Need for influenza vaccination 06/11/2022   Chronic pain of left knee 05/24/2022   Seasonal allergic rhinitis 03/03/2022   COPD (chronic obstructive pulmonary disease) ([place]) 02/05/2022   Abnormally low peak expiratory flow rate 11/22/2021   Diabetic polyneuropathy associated with type 2 diabetes mellitus ([place]) 05/23/2021   Achilles tendon disorder, right 05/23/2021   Severe obesity with body mass index (BMI) of 36.0 to 36.9 with serious comorbidity ([place]) 10/30/2020   Hyperlipidemia    Hypertension associated with diabetes ([place])    Chronic midline low back pain without sciatica 01/10/2020   Back pain of lumbar region with sciatica 01/07/2020   Varicose veins of lower extremities with other complications 12/87/8676   Varicose veins of lower extremities with inflammation 06/14/2011    Immunization History  Administered Date(s) Administered   Influenza Inj Mdck Quad Pf 07/18/2020, 05/18/2021, 06/11/2022   Moderna Covid-19 Vaccine Bivalent Booster 84yr & up 08/22/2021   Moderna SARS-COV2 Booster Vaccination 02/09/2021   Moderna Sars-Covid-2 Vaccination 10/07/2019, 11/02/2019   Pneumococcal Polysaccharide-23 10/09/2011   Tdap 05/15/2018    Conditions to be addressed/monitored:  Hyperlipidemia and Diabetes  Care Plan : [place] Updates made by KLane Hacker [place] 08/15/2022 12:00 AM     Problem: htn, hld, dm   Priority: High  Onset Date: 12/07/2020     Long-Range Goal: Disease Management   Start Date: 12/07/2020  Expected End Date: 12/08/2021  Recent Progress: On track  Priority: High  Note:   Current Barriers:  Unable to independently afford treatment regimen Unable to achieve control of blood sugar    Pharmacist Clinical Goal(s):  Patient will  verbalize ability to afford treatment regimen achieve control of diabetes as evidenced by blood  sugar through collaboration with PharmD and provider.   Interventions: 1:1 collaboration with [name], [name], MD regarding development and update of comprehensive plan of care as evidenced by provider attestation and co-signature Inter-disciplinary care team collaboration (see longitudinal plan of care) Comprehensive medication review performed; medication list updated in electronic medical record  Hypertension (BP goal <130/80) BP Readings from Last 3 Encounters:  06/11/22 104/62  05/24/22 100/60  03/07/22 122/74   Lab Results  Component Value Date   K 4.6 06/11/2022  -Controlled -Current treatment: ramipril 2.5 mg daily Appropriate, Effective, Safe, Accessible -Medications previously tried: Spironolactone -Current home readings: not testing -Current dietary habits: has had very little appetite since recovering from pneumonia -Current exercise habits: limited due to mobility -Denies hypotensive/hypertensive symptoms -Educated on BP goals and benefits of medications for prevention of heart attack, stroke and kidney damage; Daily salt intake goal < 2300 mg; Exercise goal of 150 minutes per week; -Counseled to monitor BP at home weekly, document, and provide log at future appointments -Counseled on diet and exercise extensively and encouraged to resume PT.  Recommended to continue current medication  Hyperlipidemia: (LDL goal < 70) Lab Results  Component Value Date   CHOL 136 06/11/2022   CHOL 125 02/27/2022   CHOL 140 11/22/2021   Lab Results  Component Value Date   HDL 62 06/11/2022   HDL 58 02/27/2022   HDL 58 11/22/2021   Lab Results  Component Value Date   LDLCALC 63 06/11/2022   LDLCALC 53 02/27/2022   LDLCALC 70 11/22/2021   Lab Results  Component Value Date   TRIG 51 06/11/2022   TRIG 69 02/27/2022   TRIG 56 11/22/2021   Lab Results  Component Value Date   CHOLHDL 2.2 06/11/2022   CHOLHDL 2.2 02/27/2022   CHOLHDL 2.4 11/22/2021  No results found for:  "LDLDIRECT" -Controlled -Current treatment: pravastatin 40 mg at bedtime Appropriate, Effective, Safe, Accessible -Medications previously tried: none reported  -Current dietary patterns: limited appetite lately since pneumonia -Current exercise habits: limited due to mobility -Educated on Cholesterol goals;  Benefits of statin for ASCVD risk reduction; Importance of limiting foods high in cholesterol; Exercise goal of 150 minutes per week; -Counseled on diet and exercise extensively Recommended to continue current medication and consider change in statin after next blood work if LDL above goal.   Diabetes (A1c goal <7%) Lab Results  Component Value Date   HGBA1C 6.7 (H) 06/11/2022   HGBA1C 6.7 (H) 02/27/2022   HGBA1C 6.7 (H) 11/22/2021   Lab Results  Component Value Date   MICROALBUR 30 05/18/2021   LDLCALC 63 06/11/2022   CREATININE 0.68 06/11/2022   Lab Results  Component Value Date   NA 143 06/11/2022   K 4.6 06/11/2022   CREATININE 0.68 06/11/2022   GFRNONAA 91 10/27/2020   GFRAA 105 10/27/2020   GLUCOSE 93 06/11/2022   Lab Results  Component Value Date   WBC 11.0 (H) 06/11/2022   HGB 14.1 06/11/2022   HCT 42.7 06/11/2022   MCV 87 06/11/2022   PLT 246 06/11/2022  -Controlled -Current medications: Ozmepic 54m weekly Appropriate, Effective, Safe, Accessible PAP Approved 2023 PAP sent to patient as of Nov 2023 for 2024 Tresiba 30 units Appropriate, Effective, Safe, Accessible PAP Approved 2023 PAP sent to patient as of Nov 2023 for 2024 Actos 30 mg daily Appropriate, Effective, Safe, Accessible [facility] testing supplies  Metformin 1000 mg bid Appropriate,  Effective, Safe, Accessible Farxiga 5 mg daily Appropriate, Effective, Safe, Accessible PAP Approved 2023 PAP approved 2024 -Medications previously tried: 70/30 insulin   -Current home glucose readings fasting glucose: unable to check currently  -Denies hypoglycemic/hyperglycemic symptoms -Current meal  patterns:  Patient doesn't snack. Eats 2-3 meals daily.  -Current exercise: limited due to mobility - seeing vein specialist soon -Educated on A1c and blood sugar goals; Complications of diabetes including kidney damage, retinal damage, and cardiovascular disease; Exercise goal of 150 minutes per week; June 2023: Sugars at goal! November 2023: Patient stopped checking sugars. Coordinated with [facility] to get TS delivered   Patient Goals/Self-Care Activities Patient will:  - take medications as prescribed focus on medication adherence by using pill box check glucose daily, document, and provide at future appointments target a minimum of 150 minutes of moderate intensity exercise weekly engage in dietary modifications by limiting carbohydrates and balancing meals using plate method.   Follow Up Plan: Telephone follow up appointment with care management team member scheduled for: November 2024  Arizona Constable, [place].D. - [phone]       Medication Assistance:   [name]: -Approved [id number] -Received application for 8016 in Nov 2023  Ozempic:  -Approved 5537 -Received application for 4827 in Nov 2023  Farxiga: -Approved 2023 -Approved 2024  Patient's preferred pharmacy is:  [facility] [address] [place] [address] [place] [address] Phone: [phone] Fax: [phone]  [facility] - [place], [place] - [address] [place] [address] Phone: [phone] Fax: [place], [place]. [place] [address] Phone: [phone] Fax: [phone]  [facility], [facility] (New Address) - [place], [place] AT Previously: [place], [place] [address] [place] [address] Phone: [phone] Fax: [phone]   Uses pill box?  Yes Pt endorses good compliance  We discussed: Benefits of medication synchronization, packaging and delivery as well as enhanced pharmacist oversight with [facility]. Patient decided to: Utilize [facility] pharmacy for medication synchronization, packaging and delivery  Care Plan and Follow Up Patient Decision:  Patient agrees to Care Plan and Follow-up.  Plan: Telephone follow up appointment with care management team member scheduled for:  Nov 2024  Arizona Constable, [place].D. - [phone]

## 2022-08-15 NOTE — Patient Instructions (Signed)
Visit Information   Goals Addressed   None    Patient Care Plan: CCM Pharmacy Care Plan     Problem Identified: htn, hld, dm   Priority: High  Onset Date: 12/07/2020     Long-Range Goal: Disease Management   Start Date: 12/07/2020  Expected End Date: 12/08/2021  Recent Progress: On track  Priority: High  Note:   Current Barriers:  Unable to independently afford treatment regimen Unable to achieve control of blood sugar    Pharmacist Clinical Goal(s):  Patient will verbalize ability to afford treatment regimen achieve control of diabetes as evidenced by blood sugar through collaboration with PharmD and provider.   Interventions: 1:1 collaboration with [name], [name], MD regarding development and update of comprehensive plan of care as evidenced by provider attestation and co-signature Inter-disciplinary care team collaboration (see longitudinal plan of care) Comprehensive medication review performed; medication list updated in electronic medical record  Hypertension (BP goal <130/80) BP Readings from Last 3 Encounters:  06/11/22 104/62  05/24/22 100/60  03/07/22 122/74   Lab Results  Component Value Date   K 4.6 06/11/2022  -Controlled -Current treatment: ramipril 2.5 mg daily Appropriate, Effective, Safe, Accessible -Medications previously tried: Spironolactone -Current home readings: not testing -Current dietary habits: has had very little appetite since recovering from pneumonia -Current exercise habits: limited due to mobility -Denies hypotensive/hypertensive symptoms -Educated on BP goals and benefits of medications for prevention of heart attack, stroke and kidney damage; Daily salt intake goal < 2300 mg; Exercise goal of 150 minutes per week; -Counseled to monitor BP at home weekly, document, and provide log at future appointments -Counseled on diet and exercise extensively and encouraged to resume PT.  Recommended to continue current  medication  Hyperlipidemia: (LDL goal < 70) Lab Results  Component Value Date   CHOL 136 06/11/2022   CHOL 125 02/27/2022   CHOL 140 11/22/2021   Lab Results  Component Value Date   HDL 62 06/11/2022   HDL 58 02/27/2022   HDL 58 11/22/2021   Lab Results  Component Value Date   LDLCALC 63 06/11/2022   LDLCALC 53 02/27/2022   LDLCALC 70 11/22/2021   Lab Results  Component Value Date   TRIG 51 06/11/2022   TRIG 69 02/27/2022   TRIG 56 11/22/2021   Lab Results  Component Value Date   CHOLHDL 2.2 06/11/2022   CHOLHDL 2.2 02/27/2022   CHOLHDL 2.4 11/22/2021  No results found for: "LDLDIRECT" -Controlled -Current treatment: pravastatin 40 mg at bedtime Appropriate, Effective, Safe, Accessible -Medications previously tried: none reported  -Current dietary patterns: limited appetite lately since pneumonia -Current exercise habits: limited due to mobility -Educated on Cholesterol goals;  Benefits of statin for ASCVD risk reduction; Importance of limiting foods high in cholesterol; Exercise goal of 150 minutes per week; -Counseled on diet and exercise extensively Recommended to continue current medication and consider change in statin after next blood work if LDL above goal.   Diabetes (A1c goal <7%) Lab Results  Component Value Date   HGBA1C 6.7 (H) 06/11/2022   HGBA1C 6.7 (H) 02/27/2022   HGBA1C 6.7 (H) 11/22/2021   Lab Results  Component Value Date   MICROALBUR 30 05/18/2021   LDLCALC 63 06/11/2022   CREATININE 0.68 06/11/2022   Lab Results  Component Value Date   NA 143 06/11/2022   K 4.6 06/11/2022   CREATININE 0.68 06/11/2022   GFRNONAA 91 10/27/2020   GFRAA 105 10/27/2020   GLUCOSE 93 06/11/2022   Lab Results  Component Value Date   WBC 11.0 (H) 06/11/2022   HGB 14.1 06/11/2022   HCT 42.7 06/11/2022   MCV 87 06/11/2022   PLT 246 06/11/2022  -Controlled -Current medications: Ozmepic '2mg'$  weekly Appropriate, Effective, Safe, Accessible PAP  Approved 2023 PAP sent to patient as of Nov 2023 for 2024 Tresiba 30 units Appropriate, Effective, Safe, Accessible PAP Approved 2023 PAP sent to patient as of Nov 2023 for 2024 Actos 30 mg daily Appropriate, Effective, Safe, Accessible [employer] testing supplies  Metformin 1000 mg bid Appropriate, Effective, Safe, Accessible Farxiga 5 mg daily Appropriate, Effective, Safe, Accessible PAP Approved 2023 PAP approved 2024 -Medications previously tried: 70/30 insulin   -Current home glucose readings fasting glucose: unable to check currently  -Denies hypoglycemic/hyperglycemic symptoms -Current meal patterns:  Patient doesn't snack. Eats 2-3 meals daily.  -Current exercise: limited due to mobility - seeing vein specialist soon -Educated on A1c and blood sugar goals; Complications of diabetes including kidney damage, retinal damage, and cardiovascular disease; Exercise goal of 150 minutes per week; June 2023: Sugars at goal! November 2023: Patient stopped checking sugars. Coordinated with [facility] to get TS delivered   Patient Goals/Self-Care Activities Patient will:  - take medications as prescribed focus on medication adherence by using pill box check glucose daily, document, and provide at future appointments target a minimum of 150 minutes of moderate intensity exercise weekly engage in dietary modifications by limiting carbohydrates and balancing meals using plate method.   Follow Up Plan: Telephone follow up appointment with care management team member scheduled for: November 2024  Brandi Velazquez, [place].D. - [phone]      Brandi Velazquez was given information about Chronic Care Management services today including:  CCM service includes personalized support from designated clinical staff supervised by her physician, including individualized plan of care and coordination with other care providers 24/7 contact phone numbers for assistance for urgent and routine care  needs. Standard insurance, coinsurance, copays and deductibles apply for chronic care management only during months in which we provide at least 20 minutes of these services. Most insurances cover these services at 100%, however patients may be responsible for any copay, coinsurance and/or deductible if applicable. This service may help you avoid the need for more expensive face-to-face services. Only one practitioner may furnish and bill the service in a calendar month. The patient may stop CCM services at any time (effective at the end of the month) by phone call to the office staff.  Patient agreed to services and verbal consent obtained.   The patient verbalized understanding of instructions, educational materials, and care plan provided today and DECLINED offer to receive copy of patient instructions, educational materials, and care plan.  The pharmacy team will reach out to the patient again over the next 60 days.   Lane Hacker, [place]

## 2022-08-16 DIAGNOSIS — I152 Hypertension secondary to endocrine disorders: Secondary | ICD-10-CM

## 2022-08-16 DIAGNOSIS — E1159 Type 2 diabetes mellitus with other circulatory complications: Secondary | ICD-10-CM

## 2022-08-16 DIAGNOSIS — E782 Mixed hyperlipidemia: Secondary | ICD-10-CM

## 2022-08-16 DIAGNOSIS — E1142 Type 2 diabetes mellitus with diabetic polyneuropathy: Secondary | ICD-10-CM

## 2022-08-17 ENCOUNTER — Other Ambulatory Visit: Payer: Self-pay

## 2022-08-17 DIAGNOSIS — Z794 Long term (current) use of insulin: Secondary | ICD-10-CM

## 2022-08-17 MED ORDER — RELION BLOOD GLUCOSE TEST VI STRP: ORAL_STRIP | 2 refills | Status: DC

## 2022-08-17 MED ORDER — OMEPRAZOLE 40 MG PO CPDR: 40.0000 mg | DELAYED_RELEASE_CAPSULE | Freq: Every day | ORAL | 1 refills | Status: DC

## 2022-08-23 ENCOUNTER — Other Ambulatory Visit: Payer: Self-pay

## 2022-08-23 DIAGNOSIS — E1142 Type 2 diabetes mellitus with diabetic polyneuropathy: Secondary | ICD-10-CM

## 2022-08-23 MED ORDER — ACCU-CHEK AVIVA PLUS VI STRP: 1.0000 | ORAL_STRIP | Freq: Two times a day (BID) | 2 refills | Status: DC

## 2022-08-28 DIAGNOSIS — J449 Chronic obstructive pulmonary disease, unspecified: Secondary | ICD-10-CM | POA: Diagnosis not present

## 2022-08-28 DIAGNOSIS — Z79899 Other long term (current) drug therapy: Secondary | ICD-10-CM | POA: Diagnosis not present

## 2022-08-28 DIAGNOSIS — Z7984 Long term (current) use of oral hypoglycemic drugs: Secondary | ICD-10-CM | POA: Diagnosis not present

## 2022-08-28 DIAGNOSIS — G4733 Obstructive sleep apnea (adult) (pediatric): Secondary | ICD-10-CM | POA: Diagnosis not present

## 2022-08-28 DIAGNOSIS — H2512 Age-related nuclear cataract, left eye: Secondary | ICD-10-CM | POA: Diagnosis not present

## 2022-08-28 DIAGNOSIS — E1139 Type 2 diabetes mellitus with other diabetic ophthalmic complication: Secondary | ICD-10-CM | POA: Diagnosis not present

## 2022-08-28 DIAGNOSIS — E1136 Type 2 diabetes mellitus with diabetic cataract: Secondary | ICD-10-CM | POA: Diagnosis not present

## 2022-08-28 DIAGNOSIS — Z9981 Dependence on supplemental oxygen: Secondary | ICD-10-CM | POA: Diagnosis not present

## 2022-08-28 DIAGNOSIS — E7800 Pure hypercholesterolemia, unspecified: Secondary | ICD-10-CM | POA: Diagnosis not present

## 2022-08-28 DIAGNOSIS — H25812 Combined forms of age-related cataract, left eye: Secondary | ICD-10-CM | POA: Diagnosis not present

## 2022-08-28 DIAGNOSIS — H25811 Combined forms of age-related cataract, right eye: Secondary | ICD-10-CM | POA: Diagnosis not present

## 2022-08-28 DIAGNOSIS — H401112 Primary open-angle glaucoma, right eye, moderate stage: Secondary | ICD-10-CM | POA: Diagnosis not present

## 2022-08-28 DIAGNOSIS — H401121 Primary open-angle glaucoma, left eye, mild stage: Secondary | ICD-10-CM | POA: Diagnosis not present

## 2022-08-28 DIAGNOSIS — H401120 Primary open-angle glaucoma, left eye, stage unspecified: Secondary | ICD-10-CM | POA: Diagnosis not present

## 2022-09-05 ENCOUNTER — Telehealth: Payer: Self-pay

## 2022-09-05 DIAGNOSIS — H25812 Combined forms of age-related cataract, left eye: Secondary | ICD-10-CM | POA: Diagnosis not present

## 2022-09-05 NOTE — Chronic Care Management (AMB) (Signed)
AZ&Me Notification:  Patient was approved from 07/11/22- 09/17/23 for [place].     Brandi Velazquez, [place] Pharmacist Assistant [phone]

## 2022-09-15 ENCOUNTER — Other Ambulatory Visit: Payer: Self-pay | Admitting: Family Medicine

## 2022-09-15 ENCOUNTER — Other Ambulatory Visit: Payer: Self-pay | Admitting: Nurse Practitioner

## 2022-09-15 DIAGNOSIS — E782 Mixed hyperlipidemia: Secondary | ICD-10-CM

## 2022-09-18 ENCOUNTER — Telehealth: Payer: Self-pay

## 2022-09-18 NOTE — Progress Notes (Signed)
Reason for Encounter: Medication coordination and delivery  Contacted patient on 09/18/2022 to discuss medications   Recent office visits:  None  Recent consult visits:  None  Hospital visits:  None  Medications: Outpatient Encounter Medications as of 09/18/2022  Medication Sig   ACCU-CHEK AVIVA PLUS test strip 1 each by Other route 2 (two) times daily. Use as instructed   dapagliflozin propanediol (FARXIGA) 10 MG TABS tablet Take 1 tablet (10 mg total) by mouth daily before breakfast.   fluticasone (FLONASE) 50 MCG/ACT nasal spray Place 2 sprays into both nostrils daily.   insulin degludec (TRESIBA FLEXTOUCH) 200 UNIT/ML FlexTouch Pen Inject 30 Units into the skin daily.   metFORMIN (GLUCOPHAGE) 1000 MG tablet TAKE ONE TABLET BY MOUTH TWICE DAILY WITH A MEAL   montelukast (SINGULAIR) 10 MG tablet Take 1 tablet (10 mg total) by mouth at bedtime.   Multiple Vitamin (MULTIVITAMIN) tablet Take 1 tablet by mouth daily.   naproxen (NAPROSYN) 500 MG tablet Take 1 tablet (500 mg total) by mouth 2 (two) times daily with a meal.   omeprazole (PRILOSEC) 40 MG capsule Take 1 capsule (40 mg total) by mouth daily.   pioglitazone (ACTOS) 30 MG tablet Take 1 tablet (30 mg total) by mouth daily.   pravastatin (PRAVACHOL) 40 MG tablet TAKE ONE TABLET BY MOUTH EVERYDAY AT BEDTIME   ramipril (ALTACE) 2.5 MG capsule TAKE ONE CAPSULE BY MOUTH EVERY MORNING   Semaglutide (OZEMPIC, 2 MG/DOSE, [place]) Inject 2 mg into the skin once a week.   No facility-administered encounter medications on file as of 09/18/2022.   BP Readings from Last 3 Encounters:  06/11/22 104/62  05/24/22 100/60  03/07/22 122/74    Pulse Readings from Last 3 Encounters:  06/11/22 85  05/24/22 74  03/07/22 79    Lab Results  Component Value Date/Time   HGBA1C 6.7 (H) 06/11/2022 11:34 AM   HGBA1C 6.7 (H) 02/27/2022 10:41 AM   Lab Results  Component Value Date   CREATININE 0.68 06/11/2022   BUN 21 06/11/2022   GFRNONAA 91  10/27/2020   GFRAA 105 10/27/2020   NA 143 06/11/2022   K 4.6 06/11/2022   CALCIUM 9.5 06/11/2022   CO2 23 06/11/2022     Last adherence delivery date:07/02/22      Patient is due for next adherence delivery on: 09/28/22  Spoke with patient on 09/18/22 reviewed medications and coordinated delivery.  This delivery to include: Adherence Packaging  90 Days  Metformin HCI '1000mg'$  1 B, 1 EM Spironolactone '25mg'$  1 BB Pravastatin '40mg'$  1 BT Ramipril 2.'5mg'$  1 B Pioglitazone HCI '30mg'$  1 BB  Patient declined the following medications this month: Flonase Nasal Spray- Does not need at this moment  Test Strips-Received on 08/24/22 90ds Omeprazole '40mg'$ - Filled 08/17/22 90ds  Montelukast '10mg'$ -Received at [facility] 08/28/22 90ds Naproxen '500mg'$ -Received at [facility] 05/24/22 30ds Patient gets Patient Assistance for the following: Farxiga '10mg'$  Ozempic 2 mg Tresiba 200 units  Refills requested from providers include: Spironolactone '25mg'$  Pravastatin '40mg'$  Ramipril 2.'5mg'$    Confirmed delivery date of 09/28/22, advised patient that pharmacy will contact them the morning of delivery.   Any concerns about your medications? No  How often do you forget or accidentally miss a dose? Never  Do you use a pillbox? No  Is patient in packaging Yes  If yes  Any concerns or issues with your packaging? None   Recent blood pressure readings are as follows:06/11/22 104/62  Recent blood glucose readings are as follows:09/15/22 83  Cycle dispensing form sent to Pattricia Boss for review.   [name], [place] Pharmacist Assistant  [phone]

## 2022-09-18 NOTE — Chronic Care Management (AMB) (Cosign Needed)
Error

## 2022-09-18 NOTE — Progress Notes (Deleted)
Reason for Encounter: Medication coordination and delivery  Contacted patient on 09/18/2022 to discuss medications   Recent office visits:  None  Recent consult visits:  None  Hospital visits:  None  Medications: Outpatient Encounter Medications as of 09/18/2022  Medication Sig   ACCU-CHEK AVIVA PLUS test strip 1 each by Other route 2 (two) times daily. Use as instructed   dapagliflozin propanediol (FARXIGA) 10 MG TABS tablet Take 1 tablet (10 mg total) by mouth daily before breakfast.   fluticasone (FLONASE) 50 MCG/ACT nasal spray Place 2 sprays into both nostrils daily.   insulin degludec (TRESIBA FLEXTOUCH) 200 UNIT/ML FlexTouch Pen Inject 30 Units into the skin daily.   metFORMIN (GLUCOPHAGE) 1000 MG tablet TAKE ONE TABLET BY MOUTH TWICE DAILY WITH A MEAL   montelukast (SINGULAIR) 10 MG tablet Take 1 tablet (10 mg total) by mouth at bedtime.   Multiple Vitamin (MULTIVITAMIN) tablet Take 1 tablet by mouth daily.   naproxen (NAPROSYN) 500 MG tablet Take 1 tablet (500 mg total) by mouth 2 (two) times daily with a meal.   omeprazole (PRILOSEC) 40 MG capsule Take 1 capsule (40 mg total) by mouth daily.   pioglitazone (ACTOS) 30 MG tablet Take 1 tablet (30 mg total) by mouth daily.   pravastatin (PRAVACHOL) 40 MG tablet TAKE ONE TABLET BY MOUTH EVERYDAY AT BEDTIME   ramipril (ALTACE) 2.5 MG capsule TAKE ONE CAPSULE BY MOUTH EVERY MORNING   Semaglutide (OZEMPIC, 2 MG/DOSE, [place]) Inject 2 mg into the skin once a week.   No facility-administered encounter medications on file as of 09/18/2022.   BP Readings from Last 3 Encounters:  06/11/22 104/62  05/24/22 100/60  03/07/22 122/74    Pulse Readings from Last 3 Encounters:  06/11/22 85  05/24/22 74  03/07/22 79    Lab Results  Component Value Date/Time   HGBA1C 6.7 (H) 06/11/2022 11:34 AM   HGBA1C 6.7 (H) 02/27/2022 10:41 AM   Lab Results  Component Value Date   CREATININE 0.68 06/11/2022   BUN 21 06/11/2022   GFRNONAA 91  10/27/2020   GFRAA 105 10/27/2020   NA 143 06/11/2022   K 4.6 06/11/2022   CALCIUM 9.5 06/11/2022   CO2 23 06/11/2022     Last adherence delivery date:07/02/22      Patient is due for next adherence delivery on: 09/28/22  Spoke with patient on 09/18/22 reviewed medications and coordinated delivery.  This delivery to include: Adherence Packaging  90 Days  Metformin HCI '1000mg'$  1 B, 1 EM Spironolactone '25mg'$  1 BB Pravastatin '40mg'$  1 BT Ramipril 2.'5mg'$  1 B Pioglitazone HCI '30mg'$  1 BB  Patient declined the following medications this month: Flonase Nasal Spray- Does not need at this moment  Test Strips-Received on 08/24/22 90ds Omeprazole '40mg'$ - Filled 08/17/22 90ds  Montelukast '10mg'$ -Received at [facility] 08/28/22 90ds Naproxen '500mg'$ -Received at [facility] 05/24/22 30ds Patient gets Patient Assistance for the following: Farxiga '10mg'$  Ozempic 2 mg Tresiba 200 units  Refills requested from providers include: Spironolactone '25mg'$  Pravastatin '40mg'$  Ramipril 2.'5mg'$    Confirmed delivery date of 09/28/22, advised patient that pharmacy will contact them the morning of delivery.   Any concerns about your medications? No  How often do you forget or accidentally miss a dose? Never  Do you use a pillbox? No  Is patient in packaging Yes  If yes  Any concerns or issues with your packaging? None   Recent blood pressure readings are as follows:06/11/22 104/62  Recent blood glucose readings are as follows:09/15/22 83  Cycle dispensing form sent to Pattricia Boss for review.   [name], [place] Pharmacist Assistant  [phone]

## 2022-09-19 ENCOUNTER — Other Ambulatory Visit: Payer: Self-pay | Admitting: Family Medicine

## 2022-09-19 ENCOUNTER — Telehealth: Payer: Self-pay

## 2022-09-19 DIAGNOSIS — E782 Mixed hyperlipidemia: Secondary | ICD-10-CM

## 2022-09-19 MED ORDER — PRAVASTATIN SODIUM 40 MG PO TABS: ORAL_TABLET | ORAL | 1 refills | Status: DC

## 2022-09-19 MED ORDER — RAMIPRIL 2.5 MG PO CAPS: 2.5000 mg | ORAL_CAPSULE | Freq: Every morning | ORAL | 1 refills | Status: DC

## 2022-09-19 MED ORDER — SPIRONOLACTONE 25 MG PO TABS: 25.0000 mg | ORAL_TABLET | Freq: Every morning | ORAL | 1 refills | Status: DC

## 2022-09-19 NOTE — Telephone Encounter (Signed)
-----   Message from Eulis Canner sent at 09/18/2022  1:49 PM EST ----- Regarding: Refils Good morning,  [facility] needs refills on the following Spironolactone '25mg'$  Pravastatin '40mg'$  Ramipril 2.'5mg'$    Thank you  [name], [place] Pharmacist Assistant  [phone]

## 2022-09-19 NOTE — Progress Notes (Signed)
Spironolactone '25mg'$  Pravastatin '40mg'$  Ramipril 2.'5mg'$    Sent to [facility]. Dr. Tobie Poet

## 2022-09-24 ENCOUNTER — Telehealth: Payer: Medicare HMO

## 2022-09-26 DIAGNOSIS — Z961 Presence of intraocular lens: Secondary | ICD-10-CM | POA: Diagnosis not present

## 2022-09-26 DIAGNOSIS — H25811 Combined forms of age-related cataract, right eye: Secondary | ICD-10-CM | POA: Diagnosis not present

## 2022-09-26 DIAGNOSIS — H25812 Combined forms of age-related cataract, left eye: Secondary | ICD-10-CM | POA: Diagnosis not present

## 2022-09-26 DIAGNOSIS — H52223 Regular astigmatism, bilateral: Secondary | ICD-10-CM | POA: Diagnosis not present

## 2022-09-26 DIAGNOSIS — Z0100 Encounter for examination of eyes and vision without abnormal findings: Secondary | ICD-10-CM | POA: Diagnosis not present

## 2022-10-11 ENCOUNTER — Ambulatory Visit (INDEPENDENT_AMBULATORY_CARE_PROVIDER_SITE_OTHER): Payer: Medicare HMO | Admitting: Family Medicine

## 2022-10-11 VITALS — BP 100/60 | HR 75 | Temp 97.4°F | Resp 16 | Ht 64.0 in | Wt 219.2 lb

## 2022-10-11 DIAGNOSIS — Z23 Encounter for immunization: Secondary | ICD-10-CM | POA: Diagnosis not present

## 2022-10-11 DIAGNOSIS — I152 Hypertension secondary to endocrine disorders: Secondary | ICD-10-CM

## 2022-10-11 DIAGNOSIS — Z6837 Body mass index (BMI) 37.0-37.9, adult: Secondary | ICD-10-CM

## 2022-10-11 DIAGNOSIS — E782 Mixed hyperlipidemia: Secondary | ICD-10-CM

## 2022-10-11 DIAGNOSIS — J449 Chronic obstructive pulmonary disease, unspecified: Secondary | ICD-10-CM

## 2022-10-11 DIAGNOSIS — E1159 Type 2 diabetes mellitus with other circulatory complications: Secondary | ICD-10-CM | POA: Diagnosis not present

## 2022-10-11 DIAGNOSIS — J410 Simple chronic bronchitis: Secondary | ICD-10-CM

## 2022-10-11 DIAGNOSIS — E1142 Type 2 diabetes mellitus with diabetic polyneuropathy: Secondary | ICD-10-CM

## 2022-10-11 MED ORDER — TRESIBA FLEXTOUCH 200 UNIT/ML ~~LOC~~ SOPN: 30.0000 [IU] | PEN_INJECTOR | Freq: Every day | SUBCUTANEOUS | 1 refills | Status: DC

## 2022-10-11 MED ORDER — DAPAGLIFLOZIN PROPANEDIOL 10 MG PO TABS: 10.0000 mg | ORAL_TABLET | Freq: Every day | ORAL | 1 refills | Status: DC

## 2022-10-11 MED ORDER — OZEMPIC (2 MG/DOSE) 8 MG/3ML ~~LOC~~ SOPN: 2.0000 mg | PEN_INJECTOR | SUBCUTANEOUS | 1 refills | Status: DC

## 2022-10-11 NOTE — Patient Instructions (Signed)
Call [facility] at [phone] to check if they have your medicaid. Dr. Tobie Poet

## 2022-10-11 NOTE — Progress Notes (Signed)
Subjective:  Patient ID: Brandi Velazquez, female    DOB: [birth year]  Age: 62 y.o. MRN: [id number]  Chief Complaints:  HPI: Diabetes:  Complications:neuropathy Glucose checking: no Glucose logs: nausea/a Hypoglycemia: no symptoms Most recent A1C: 6.7  Current medications: Farxiga 10 mg daily, Tresiba 30 units daily, metformin 1000 mg 1 tablet twice daily, Ozempic 2 mg weekly Last Eye Exam:03/08/2022. Had recent cataract surgery and stents for glaucoma.  Eyes doing well  Foot checks: done.    Hyperlipidemia: Current medications: Pravastatin 40 mg daily  Hypertension: Complications: Current medications: Ramipril 2.5 mg daily, and spironolactone 25 mg daily  COPD: not using inhalers.   Diet: not healthy through the holidays. Doing better now.  Exercise:  no other than running after her twin 25 yo grandchildren.  Current Outpatient Medications on File Prior to Visit  Medication Sig Dispense Refill   ACCU-CHEK AVIVA PLUS test strip 1 each by Other route 2 (two) times daily. Use as instructed 100 each 2   fluticasone (FLONASE) 50 MCG/ACT nasal spray Place 2 sprays into both nostrils daily. 48 g 3   metFORMIN (GLUCOPHAGE) 1000 MG tablet TAKE ONE TABLET BY MOUTH TWICE DAILY WITH A MEAL 180 tablet 1   montelukast (SINGULAIR) 10 MG tablet Take 1 tablet (10 mg total) by mouth at bedtime. 90 tablet 3   Multiple Vitamin (MULTIVITAMIN) tablet Take 1 tablet by mouth daily.     pravastatin (PRAVACHOL) 40 MG tablet TAKE ONE TABLET BY MOUTH EVERYDAY AT BEDTIME 90 tablet 1   ramipril (ALTACE) 2.5 MG capsule Take 1 capsule (2.5 mg total) by mouth every morning. 90 capsule 1   spironolactone (ALDACTONE) 25 MG tablet Take 1 tablet (25 mg total) by mouth every morning. 90 tablet 1   timolol (TIMOPTIC) 0.5 % ophthalmic solution 1 drop 2 (two) times daily.     No current facility-administered medications on file prior to visit.   Past Medical History:  Diagnosis Date   Ankle pain    Back pain of  lumbar region with sciatica 01/07/2020   Chronic pain    back   COPD (chronic obstructive pulmonary disease) (HCC)    Depression    Diabetes mellitus    GERD (gastroesophageal reflux disease)    Hyperlipidemia    Hypertension    Low back pain    Obstructive sleep apnea    Pneumonia 2010   Post-menopausal atrophic vaginitis    Superficial phlebitis    Past Surgical History:  Procedure Laterality Date   BACK SURGERY  1990   CESAREAN SECTION     CYST REMOVAL HAND     inner thigh   REFRACTIVE SURGERY Right 06/07/2022   SPINE SURGERY     lumbar laminectomy and diskectomy    Family History  Problem Relation Age of Onset   Diabetes Mother    Heart disease Mother    Cancer Father    Diabetes Sister    Stroke Sister    Lung disease Neg Hx    Social History   Socioeconomic History   Marital status: Single    Spouse name: Not on file   Number of children: Not on file   Years of education: Not on file   Highest education level: Not on file  Occupational History   Not on file  Tobacco Use   Smoking status: Former    Packs/day: 1.50    Years: 15.00    Total pack years: 22.50    Types: Cigarettes  Quit date: 03/20/2009    Years since quitting: 13.5   Smokeless tobacco: Never  Vaping Use   Vaping Use: Never used  Substance and Sexual Activity   Alcohol use: No   Drug use: No   Sexual activity: Not Currently    Partners: Male  Other Topics Concern   Not on file  Social History Narrative   Not on file   Social Determinants of Health   Financial Resource Strain: High Risk (08/15/2022)   Overall Financial Resource Strain (CARDIA)    Difficulty of Paying Living Expenses: Hard  Food Insecurity: No Food Insecurity (02/05/2022)   Hunger Vital Sign    Worried About Running Out of Food in the Last Year: Never true    Ran Out of Food in the Last Year: Never true  Transportation Needs: No Transportation Needs (08/15/2022)   PRAPARE - Radiographer, therapeutic (Medical): No    Lack of Transportation (Non-Medical): No  Physical Activity: Inactive (02/05/2022)   Exercise Vital Sign    Days of Exercise per Week: 0 days    Minutes of Exercise per Session: 0 min  Stress: Not on file  Social Connections: Unknown (02/05/2022)   Social Connection and Isolation Panel [NHANES]    Frequency of Communication with Friends and Family: More than three times a week    Frequency of Social Gatherings with Friends and Family: More than three times a week    Attends Religious Services: 1 to 4 times per year    Active Member of [employer] or Organizations: No    Attends Archivist Meetings: Never    Marital Status: Not on file    Review of Systems  Constitutional:  Positive for fatigue.  HENT:  Negative for congestion, ear pain and sore throat.   Respiratory:  Negative for cough and shortness of breath.   Cardiovascular:  Negative for chest pain.  Gastrointestinal:  Negative for abdominal pain, constipation, diarrhea, nausea and vomiting.  Genitourinary:  Negative for dysuria, frequency and urgency.  Musculoskeletal:  Negative for arthralgias, back pain and myalgias.  Neurological:  Negative for dizziness and headaches.  Psychiatric/Behavioral:  Negative for agitation and sleep disturbance. The patient is not nervous/anxious.      Objective:  BP 100/60   Pulse 75   Temp (!) 97.4 F (36.3 C)   Resp 16   Ht '5\' 4"'$  (1.626 m)   Wt 219 lb 3.2 oz (99.4 kg)   SpO2 98%   BMI 37.63 kg/m      10/11/2022    9:55 AM 06/11/2022   10:48 AM 05/24/2022    9:25 AM  BP/Weight  Systolic BP 706 237 628  Diastolic BP 60 62 60  Wt. (Lbs) 219.2 204 199  BMI 37.63 kg/m2 36.14 kg/m2 35.25 kg/m2    Physical Exam Vitals reviewed.  Constitutional:      Appearance: Normal appearance. She is normal weight.  Neck:     Vascular: No carotid bruit.  Cardiovascular:     Rate and Rhythm: Normal rate and regular rhythm.     Heart sounds: Normal heart  sounds.  Pulmonary:     Effort: Pulmonary effort is normal.     Breath sounds: Normal breath sounds.  Abdominal:     General: Abdomen is flat. Bowel sounds are normal.     Palpations: Abdomen is soft.     Tenderness: There is no abdominal tenderness.  Musculoskeletal:     Cervical back: Normal range of  motion.  Neurological:     Mental Status: She is alert and oriented to person, place, and time.  Psychiatric:        Mood and Affect: Mood normal.        Behavior: Behavior normal.     Diabetic Foot Exam - Simple   Simple Foot Form  10/11/2022  5:07 PM  Visual Inspection No deformities, no ulcerations, no other skin breakdown bilaterally: Yes Sensation Testing Intact to touch and monofilament testing bilaterally: Yes Pulse Check Posterior Tibialis and Dorsalis pulse intact bilaterally: Yes Comments      Lab Results  Component Value Date   WBC 8.6 10/11/2022   HGB 14.0 10/11/2022   HCT 41.9 10/11/2022   PLT 238 10/11/2022   GLUCOSE 102 (H) 10/11/2022   CHOL 154 10/11/2022   TRIG 63 10/11/2022   HDL 62 10/11/2022   LDLCALC 79 10/11/2022   ALT 21 10/11/2022   AST 14 10/11/2022   NA 141 10/11/2022   K 4.1 10/11/2022   CL 104 10/11/2022   CREATININE 0.68 10/11/2022   BUN 20 10/11/2022   CO2 24 10/11/2022   TSH 1.500 11/22/2021   HGBA1C 7.0 (H) 10/11/2022   MICROALBUR 30 05/18/2021      Assessment & Plan:  Diabetic polyneuropathy associated with type 2 diabetes mellitus ([place]) Control: good Recommend check sugars fasting daily. Recommend check feet daily. Recommend annual eye exams. Medicines: no changes Continue to work on eating a healthy diet and exercise.  Labs drawn today.     Hyperlipidemia Well controlled.  No changes to medicines.  Continue to work on eating a healthy diet and exercise.  Labs drawn today.    Hypertension associated with diabetes ([place]) The current medical regimen is effective;  continue present plan and medications. Check  labs.   Severe obesity with body mass index (BMI) of 36.0 to 36.9 with serious comorbidity ([place]) Recommend continue to work on eating healthy diet and exercise.   COPD (chronic obstructive pulmonary disease) (HCC) Not currently in need of inhalers.    Follow-up: Return in about 3 months (around 01/10/2023) for chronic fasting.  An After Visit Summary was printed and given to the patient.  Neil Crouch, DNP, [place] [phone]

## 2022-10-13 LAB — HEMOGLOBIN A1C
Est. average glucose Bld gHb Est-mCnc: 154 mg/dL
Hgb A1c MFr Bld: 7 % — ABNORMAL HIGH (ref 4.8–5.6)

## 2022-10-13 LAB — CBC WITH DIFF/PLATELET
Basophils Absolute: 0 10*3/uL (ref 0.0–0.2)
Basos: 0 %
EOS (ABSOLUTE): 0.3 10*3/uL (ref 0.0–0.4)
Eos: 3 %
Hematocrit: 41.9 % (ref 34.0–46.6)
Hemoglobin: 14 g/dL (ref 11.1–15.9)
Immature Grans (Abs): 0 10*3/uL (ref 0.0–0.1)
Immature Granulocytes: 0 %
Lymphocytes Absolute: 2.2 10*3/uL (ref 0.7–3.1)
Lymphs: 25 %
MCH: 29 pg (ref 26.6–33.0)
MCHC: 33.4 g/dL (ref 31.5–35.7)
MCV: 87 fL (ref 79–97)
Monocytes Absolute: 0.6 10*3/uL (ref 0.1–0.9)
Monocytes: 8 %
Neutrophils Absolute: 5.5 10*3/uL (ref 1.4–7.0)
Neutrophils: 64 %
Platelets: 238 10*3/uL (ref 150–450)
RBC: 4.82 x10E6/uL (ref 3.77–5.28)
RDW: 12.8 % (ref 11.7–15.4)
WBC: 8.6 10*3/uL (ref 3.4–10.8)

## 2022-10-13 LAB — LIPID PANEL
Chol/HDL Ratio: 2.5 ratio (ref 0.0–4.4)
Cholesterol, Total: 154 mg/dL (ref 100–199)
HDL: 62 mg/dL (ref >39)
LDL Chol Calc (NIH): 79 mg/dL (ref 0–99)
Triglycerides: 63 mg/dL (ref 0–149)
VLDL Cholesterol Cal: 13 mg/dL (ref 5–40)

## 2022-10-13 LAB — COMPREHENSIVE METABOLIC PANEL
ALT: 21 IU/L (ref 0–32)
AST: 14 IU/L (ref 0–40)
Albumin/Globulin Ratio: 1.8 (ref 1.2–2.2)
Albumin: 4.1 g/dL (ref 3.9–4.9)
Alkaline Phosphatase: 45 IU/L (ref 44–121)
BUN/Creatinine Ratio: 29 — ABNORMAL HIGH (ref 12–28)
BUN: 20 mg/dL (ref 8–27)
Bilirubin Total: 0.4 mg/dL (ref 0.0–1.2)
CO2: 24 mmol/L (ref 20–29)
Calcium: 9.4 mg/dL (ref 8.7–10.3)
Chloride: 104 mmol/L (ref 96–106)
Creatinine, Ser: 0.68 mg/dL (ref 0.57–1.00)
Globulin, Total: 2.3 g/dL (ref 1.5–4.5)
Glucose: 102 mg/dL — ABNORMAL HIGH (ref 70–99)
Potassium: 4.1 mmol/L (ref 3.5–5.2)
Sodium: 141 mmol/L (ref 134–144)
Total Protein: 6.4 g/dL (ref 6.0–8.5)
eGFR: 98 mL/min/{1.73_m2} (ref >59)

## 2022-10-13 LAB — MICROALBUMIN / CREATININE URINE RATIO
Creatinine, Urine: 81.7 mg/dL
Microalb/Creat Ratio: 17 mg/g creat (ref 0–29)
Microalbumin, Urine: 14 ug/mL

## 2022-10-13 LAB — CARDIOVASCULAR RISK ASSESSMENT

## 2022-10-14 ENCOUNTER — Encounter: Payer: Self-pay | Admitting: Family Medicine

## 2022-10-14 NOTE — Assessment & Plan Note (Signed)
The current medical regimen is effective;  continue present plan and medications. Check labs. 

## 2022-10-14 NOTE — Assessment & Plan Note (Signed)
Not currently in need of inhalers.

## 2022-10-14 NOTE — Assessment & Plan Note (Signed)
Control: good Recommend check sugars fasting daily. Recommend check feet daily. Recommend annual eye exams. Medicines: no changes Continue to work on eating a healthy diet and exercise.  Labs drawn today.

## 2022-10-14 NOTE — Assessment & Plan Note (Signed)
Recommend continue to work on eating healthy diet and exercise.  

## 2022-10-14 NOTE — Assessment & Plan Note (Signed)
Well controlled.  ?No changes to medicines.  ?Continue to work on eating a healthy diet and exercise.  ?Labs drawn today.  ?

## 2022-10-18 ENCOUNTER — Telehealth: Payer: Self-pay

## 2022-10-18 NOTE — Progress Notes (Signed)
Care Management & Coordination Services Pharmacy Team  Reason for Encounter: Medication coordination and delivery  Contacted patient to discuss medications and coordinate delivery from [facility].  Spoke with patient on 10/18/2022   Cycle dispensing form sent to [place] [facility] for review.   Last adherence delivery date:09/28/22      Patient is due for next adherence delivery on: 10/30/22  This delivery to include: Adherence Packaging  30ds  Metformin HCI '1000mg'$  1 B, 1 EM Spironolactone '25mg'$  1 BB Pravastatin '40mg'$  1 BT Ramipril 2.'5mg'$  1 B  Patient declined the following medications this month: Pioglitazone HCI '30mg'$  -This was D/C by provider  Flonase Nasal Spray- Does not need at this moment  Montelukast '10mg'$ - Pt still has plenty Test Strips-Pt does not need right now. Has not been checking sugars Patient gets Patient Assistance for the following: Farxiga '10mg'$  Ozempic 2 mg Tresiba 200 units  No refill request needed.  Confirmed delivery date of 10/30/22, advised patient that pharmacy will contact them the morning of delivery.  Any concerns about your medications? No  How often do you forget or accidentally miss a dose? Never  Do you use a pillbox? No  Is patient in packaging Yes  If yes  Any concerns or issues with your packaging? No issues    Recent blood pressure readings are as follows:10/11/22 100/60  Recent blood glucose readings are as follows:Pt has not been checking. The batteries died her in glucometer but has not picked up new batteries.   Chart review: Recent office visits:  10/11/22 Brandi Brome MD. Seen for routine visit. Changed Semaglutide to '2mg'$  weekly. D/C Naproxen, Omeprazole, Pioglitazone '30mg'$   Recent consult visits:  None  Hospital visits:  None  Medications: Outpatient Encounter Medications as of 10/18/2022  Medication Sig   ACCU-CHEK AVIVA PLUS test strip 1 each by Other route 2 (two) times daily. Use as instructed   dapagliflozin  propanediol (FARXIGA) 10 MG TABS tablet Take 1 tablet (10 mg total) by mouth daily before breakfast.   fluticasone (FLONASE) 50 MCG/ACT nasal spray Place 2 sprays into both nostrils daily.   insulin degludec (TRESIBA FLEXTOUCH) 200 UNIT/ML FlexTouch Pen Inject 30 Units into the skin daily.   metFORMIN (GLUCOPHAGE) 1000 MG tablet TAKE ONE TABLET BY MOUTH TWICE DAILY WITH A MEAL   montelukast (SINGULAIR) 10 MG tablet Take 1 tablet (10 mg total) by mouth at bedtime.   Multiple Vitamin (MULTIVITAMIN) tablet Take 1 tablet by mouth daily.   pravastatin (PRAVACHOL) 40 MG tablet TAKE ONE TABLET BY MOUTH EVERYDAY AT BEDTIME   ramipril (ALTACE) 2.5 MG capsule Take 1 capsule (2.5 mg total) by mouth every morning.   Semaglutide, 2 MG/DOSE, (OZEMPIC, 2 MG/DOSE,) 8 MG/3ML SOPN Inject 2 mg into the skin once a week.   spironolactone (ALDACTONE) 25 MG tablet Take 1 tablet (25 mg total) by mouth every morning.   timolol (TIMOPTIC) 0.5 % ophthalmic solution 1 drop 2 (two) times daily.   No facility-administered encounter medications on file as of 10/18/2022.   BP Readings from Last 3 Encounters:  10/11/22 100/60  06/11/22 104/62  05/24/22 100/60    Pulse Readings from Last 3 Encounters:  10/11/22 75  06/11/22 85  05/24/22 74    Lab Results  Component Value Date/Time   HGBA1C 7.0 (H) 10/11/2022 10:51 AM   HGBA1C 6.7 (H) 06/11/2022 11:34 AM   Lab Results  Component Value Date   CREATININE 0.68 10/11/2022   BUN 20 10/11/2022   GFRNONAA 91 10/27/2020  GFRAA 105 10/27/2020   NA 141 10/11/2022   K 4.1 10/11/2022   CALCIUM 9.4 10/11/2022   CO2 24 10/11/2022    [name], [place] Clinical Pharmacist Assistant  [phone]

## 2022-10-19 NOTE — Telephone Encounter (Signed)
Compliant on meds 

## 2022-11-16 ENCOUNTER — Telehealth: Payer: Self-pay

## 2022-11-16 NOTE — Progress Notes (Signed)
Care Management & Coordination Services Pharmacy Team  Reason for Encounter: Medication coordination and delivery  Contacted patient to discuss medications and coordinate delivery from [facility].  Spoke with patient on 11/16/2022   Cycle dispensing form sent to [place] [facility] for review.   Last adherence delivery date:10/30/22      Patient is due for next adherence delivery on: 11/28/22  This delivery to include: Adherence Packaging  30 Days  Metformin HCI '1000mg'$  1 B, 1 EM Spironolactone '25mg'$  1 BB Pravastatin '40mg'$  1 BT Ramipril 2.'5mg'$  1 B Montelukast '10mg'$ -Take 1 tablet (10 mg total) by mouth at bedtime. (Bottles)   Patient declined the following medications this month: Flonase Nasal Spray- Does not need at this moment  Test Strips-Pt does not need right now. Does not check everyday Timolol eye drops-Picked up on 11/02/22 19ds Patient gets Patient Assistance for the following: Farxiga '10mg'$  Ozempic 2 mg Tresiba 200 units  No refill request needed.  Confirmed delivery date of 11/28/22, advised patient that pharmacy will contact them the morning of delivery.  Any concerns about your medications? No  How often do you forget or accidentally miss a dose? Never  Do you use a pillbox? No  Is patient in packaging Yes  If yes  What is the date on your next pill pack?  Any concerns or issues with your packaging?   Recent blood pressure readings are as follows:10/11/22 100/60  Recent blood glucose readings are as follows: 11/14/22  110    Chart review: Recent office visits:  None  Recent consult visits:  None  Hospital visits:  None  Medications: Outpatient Encounter Medications as of 11/16/2022  Medication Sig   ACCU-CHEK AVIVA PLUS test strip 1 each by Other route 2 (two) times daily. Use as instructed   dapagliflozin propanediol (FARXIGA) 10 MG TABS tablet Take 1 tablet (10 mg total) by mouth daily before breakfast.   fluticasone (FLONASE) 50 MCG/ACT nasal spray  Place 2 sprays into both nostrils daily.   insulin degludec (TRESIBA FLEXTOUCH) 200 UNIT/ML FlexTouch Pen Inject 30 Units into the skin daily.   metFORMIN (GLUCOPHAGE) 1000 MG tablet TAKE ONE TABLET BY MOUTH TWICE DAILY WITH A MEAL   montelukast (SINGULAIR) 10 MG tablet Take 1 tablet (10 mg total) by mouth at bedtime.   Multiple Vitamin (MULTIVITAMIN) tablet Take 1 tablet by mouth daily.   pravastatin (PRAVACHOL) 40 MG tablet TAKE ONE TABLET BY MOUTH EVERYDAY AT BEDTIME   ramipril (ALTACE) 2.5 MG capsule Take 1 capsule (2.5 mg total) by mouth every morning.   Semaglutide, 2 MG/DOSE, (OZEMPIC, 2 MG/DOSE,) 8 MG/3ML SOPN Inject 2 mg into the skin once a week.   spironolactone (ALDACTONE) 25 MG tablet Take 1 tablet (25 mg total) by mouth every morning.   timolol (TIMOPTIC) 0.5 % ophthalmic solution 1 drop 2 (two) times daily.   No facility-administered encounter medications on file as of 11/16/2022.   BP Readings from Last 3 Encounters:  10/11/22 100/60  06/11/22 104/62  05/24/22 100/60    Pulse Readings from Last 3 Encounters:  10/11/22 75  06/11/22 85  05/24/22 74    Lab Results  Component Value Date/Time   HGBA1C 7.0 (H) 10/11/2022 10:51 AM   HGBA1C 6.7 (H) 06/11/2022 11:34 AM   Lab Results  Component Value Date   CREATININE 0.68 10/11/2022   BUN 20 10/11/2022   GFRNONAA 91 10/27/2020   GFRAA 105 10/27/2020   NA 141 10/11/2022   K 4.1 10/11/2022   CALCIUM 9.4  10/11/2022   CO2 24 10/11/2022     [name], [place] Pharmacist Assistant  [phone]

## 2022-11-19 NOTE — Telephone Encounter (Signed)
Compliant on meds 

## 2022-11-26 NOTE — Progress Notes (Cosign Needed Addendum)
11-26-2022: [facility] from [facility] stated patient wants t add meloxicam to 03-13 delivery and is in need of a new rx from podiatry. Contacted Dr. Amalia Hailey office and patient needs appointment for a new RX. Contacted patient and she stated she doesn't need meloxicam but does need singulair. [facility] fills medication. Informed patient she has 1 refill left and to get filled then [facility] to [facility]. Chasity was informed.  [place] Pharmacist Assistant [phone]

## 2022-12-17 ENCOUNTER — Telehealth: Payer: Self-pay

## 2022-12-17 ENCOUNTER — Other Ambulatory Visit: Payer: Self-pay

## 2022-12-17 DIAGNOSIS — J302 Other seasonal allergic rhinitis: Secondary | ICD-10-CM

## 2022-12-17 MED ORDER — MONTELUKAST SODIUM 10 MG PO TABS: 10.0000 mg | ORAL_TABLET | Freq: Every day | ORAL | 3 refills | Status: DC

## 2022-12-17 NOTE — Progress Notes (Signed)
Care Management & Coordination Services Pharmacy Team  Reason for Encounter: Medication coordination and delivery  Contacted patient to discuss medications and coordinate delivery from [facility].  Spoke with patient on 12/17/2022   Cycle dispensing form sent to Brandi Velazquez for review.   Last adherence delivery date:11/28/22      Patient is due for next adherence delivery on: 12/27/22  This delivery to include: Adherence Packaging  30 Days  Metformin HCI 1000mg  1 B, 1 EM Spironolactone 25mg  1 BB Pravastatin 40mg  1 BT Ramipril 2.5mg  1 B Montelukast 10mg -Take 1 tablet (10 mg total) by mouth at bedtime. (Bottles)  Patient declined the following medications this month: Flonase Nasal Spray- Does not need at this moment  Test Strips-Pt does not need right now. Does not check everyday Timolol eye drops-Picked up on 11/02/22 19ds Patient gets Patient Assistance for the following: Farxiga 10mg  Ozempic 2 mg Tresiba 200 units  Refills requested from providers include: Montelukast 10mg   Confirmed delivery date of 12/27/22, advised patient that pharmacy will contact them the morning of delivery.   Any concerns about your medications? No  How often do you forget or accidentally miss a dose? Never  Do you use a pillbox? No  Is patient in packaging Yes  If yes  What is the date on your next pill pack? 12/18/22  Any concerns or issues with your packaging? No concerns    Recent blood pressure readings are as follows:10/11/22 100/60  Recent blood glucose readings are as follows:118, 80, 120   Chart review: Recent office visits:  None  Recent consult visits:  None  Hospital visits:  None  Medications: Outpatient Encounter Medications as of 12/17/2022  Medication Sig   ACCU-CHEK AVIVA PLUS test strip 1 each by Other route 2 (two) times daily. Use as instructed   dapagliflozin propanediol (FARXIGA) 10 MG TABS tablet Take 1 tablet (10 mg total) by mouth daily before  breakfast.   fluticasone (FLONASE) 50 MCG/ACT nasal spray Place 2 sprays into both nostrils daily.   insulin degludec (TRESIBA FLEXTOUCH) 200 UNIT/ML FlexTouch Pen Inject 30 Units into the skin daily.   metFORMIN (GLUCOPHAGE) 1000 MG tablet TAKE ONE TABLET BY MOUTH TWICE DAILY WITH A MEAL   montelukast (SINGULAIR) 10 MG tablet Take 1 tablet (10 mg total) by mouth at bedtime.   Multiple Vitamin (MULTIVITAMIN) tablet Take 1 tablet by mouth daily.   pravastatin (PRAVACHOL) 40 MG tablet TAKE ONE TABLET BY MOUTH EVERYDAY AT BEDTIME   ramipril (ALTACE) 2.5 MG capsule Take 1 capsule (2.5 mg total) by mouth every morning.   Semaglutide, 2 MG/DOSE, (OZEMPIC, 2 MG/DOSE,) 8 MG/3ML SOPN Inject 2 mg into the skin once a week.   spironolactone (ALDACTONE) 25 MG tablet Take 1 tablet (25 mg total) by mouth every morning.   timolol (TIMOPTIC) 0.5 % ophthalmic solution 1 drop 2 (two) times daily.   No facility-administered encounter medications on file as of 12/17/2022.   BP Readings from Last 3 Encounters:  10/11/22 100/60  06/11/22 104/62  05/24/22 100/60    Pulse Readings from Last 3 Encounters:  10/11/22 75  06/11/22 85  05/24/22 74    Lab Results  Component Value Date/Time   HGBA1C 7.0 (H) 10/11/2022 10:51 AM   HGBA1C 6.7 (H) 06/11/2022 11:34 AM   Lab Results  Component Value Date   CREATININE 0.68 10/11/2022   BUN 20 10/11/2022   GFRNONAA 91 10/27/2020   GFRAA 105 10/27/2020   NA 141 10/11/2022   K 4.1  10/11/2022   CALCIUM 9.4 10/11/2022   CO2 24 10/11/2022     Brandi Velazquez, [place] Clinical Pharmacist Assistant  [phone]

## 2023-01-09 ENCOUNTER — Other Ambulatory Visit: Payer: Self-pay | Admitting: Family Medicine

## 2023-01-09 DIAGNOSIS — E1142 Type 2 diabetes mellitus with diabetic polyneuropathy: Secondary | ICD-10-CM

## 2023-01-14 ENCOUNTER — Other Ambulatory Visit: Payer: Self-pay

## 2023-01-14 ENCOUNTER — Telehealth: Payer: Self-pay

## 2023-01-14 DIAGNOSIS — E1142 Type 2 diabetes mellitus with diabetic polyneuropathy: Secondary | ICD-10-CM

## 2023-01-14 MED ORDER — DAPAGLIFLOZIN PROPANEDIOL 10 MG PO TABS: 10.0000 mg | ORAL_TABLET | Freq: Every day | ORAL | 3 refills | Status: DC

## 2023-01-14 NOTE — Progress Notes (Signed)
Patient needs a refill of her Farxiga 10 mg sent to [facility] please, patient receives through [employer] patient assistance, request sent to clinical team for a 90DS with 3 refills.      Billee Cashing, CMA Clinical Pharmacist Assistant [phone]

## 2023-01-16 ENCOUNTER — Telehealth: Payer: Self-pay

## 2023-01-16 ENCOUNTER — Other Ambulatory Visit: Payer: Self-pay

## 2023-01-16 DIAGNOSIS — J302 Other seasonal allergic rhinitis: Secondary | ICD-10-CM

## 2023-01-16 MED ORDER — FLUTICASONE PROPIONATE 50 MCG/ACT NA SUSP: 2.0000 | Freq: Every day | NASAL | 3 refills | Status: DC

## 2023-01-16 NOTE — Progress Notes (Signed)
Care Management & Coordination Services Pharmacy Team  Reason for Encounter: Medication coordination and delivery  Contacted patient to discuss medications and coordinate delivery from [facility].  Spoke with patient on 01/16/2023   Cycle dispensing form sent to Artelia Laroche for review.   Last adherence delivery date: 12/27/22      Patient is due for next adherence delivery on: 01/24/23- I had to change date from 01/28/23 because pt is going on vacation on 01/25/23 and will not be back until 02/01/23  This delivery to include: Adherence Packaging  30 Days  Metformin HCI 1000mg  1 B, 1 EM Spironolactone 25mg  1 BB Pravastatin 40mg  1 BT Ramipril 2.5mg  1 B Montelukast 10mg -Take 1 tablet (10 mg total) by mouth at bedtime. (Bottles) Flonase Nasal Spray-PRN  Patient declined the following medications this month: Test Strips-Pt does not need right now. Does not check everyday Timolol eye drops-Picked up on 12/20/22 19ds at [facility] Patient gets Patient Assistance for the following: Farxiga 10mg -LF sent 5 boxes in the past week  Ozempic 2 mg-LF 12/14/22 sent 3 boxes  Tresiba 200 units-LF 01/14/23 sent 5 boxes   Request sent to PCP for refills:  Flonase Nasal Spray-PRN  Confirmed delivery date of 01/24/23, advised patient that pharmacy will contact them the morning of delivery.   Any concerns about your medications? No  How often do you forget or accidentally miss a dose? Never  Do you use a pillbox? No  Is patient in packaging Yes  If yes  What is the date on your next pill pack? 01/17/23  Any concerns or issues with your packaging? No concerns    Recent blood glucose readings are as follows: Last reading 107   Chart review: Recent office visits:  None  Recent consult visits:  None  Hospital visits:  None  Medications: Outpatient Encounter Medications as of 01/16/2023  Medication Sig   ACCU-CHEK AVIVA PLUS test strip 1 each by Other route 2 (two) times daily. Use as  instructed   dapagliflozin propanediol (FARXIGA) 10 MG TABS tablet Take 1 tablet (10 mg total) by mouth daily before breakfast.   fluticasone (FLONASE) 50 MCG/ACT nasal spray Place 2 sprays into both nostrils daily.   metFORMIN (GLUCOPHAGE) 1000 MG tablet TAKE ONE TABLET BY MOUTH TWICE DAILY WITH A MEAL   montelukast (SINGULAIR) 10 MG tablet Take 1 tablet (10 mg total) by mouth at bedtime.   Multiple Vitamin (MULTIVITAMIN) tablet Take 1 tablet by mouth daily.   pravastatin (PRAVACHOL) 40 MG tablet TAKE ONE TABLET BY MOUTH EVERYDAY AT BEDTIME   ramipril (ALTACE) 2.5 MG capsule Take 1 capsule (2.5 mg total) by mouth every morning.   Semaglutide, 2 MG/DOSE, (OZEMPIC, 2 MG/DOSE,) 8 MG/3ML SOPN Inject 2 mg into the skin once a week.   spironolactone (ALDACTONE) 25 MG tablet Take 1 tablet (25 mg total) by mouth every morning.   timolol (TIMOPTIC) 0.5 % ophthalmic solution 1 drop 2 (two) times daily.   TRESIBA FLEXTOUCH 200 UNIT/ML FlexTouch Pen INJECT 30 UNITS INTO THE SKIN DAILY.   No facility-administered encounter medications on file as of 01/16/2023.   BP Readings from Last 3 Encounters:  10/11/22 100/60  06/11/22 104/62  05/24/22 100/60    Pulse Readings from Last 3 Encounters:  10/11/22 75  06/11/22 85  05/24/22 74    Lab Results  Component Value Date/Time   HGBA1C 7.0 (H) 10/11/2022 10:51 AM   HGBA1C 6.7 (H) 06/11/2022 11:34 AM   Lab Results  Component Value Date  CREATININE 0.68 10/11/2022   BUN 20 10/11/2022   GFRNONAA 91 10/27/2020   GFRAA 105 10/27/2020   NA 141 10/11/2022   K 4.1 10/11/2022   CALCIUM 9.4 10/11/2022   CO2 24 10/11/2022     Roxana Hires, CMA Clinical Pharmacist Assistant  [phone]

## 2023-01-23 DIAGNOSIS — H409 Unspecified glaucoma: Secondary | ICD-10-CM | POA: Diagnosis not present

## 2023-02-07 NOTE — Progress Notes (Unsigned)
Subjective:  Patient ID: Brandi Velazquez, female    DOB: 1961-06-25  Age: 62 y.o. MRN: [id number]  No chief complaint on file.   HPI   Diabetes:  Complications:neuropathy Glucose checking: no Glucose logs: nausea/a Hypoglycemia: no symptoms Most recent A1C: 7.0  Current medications: Farxiga 10 mg daily, Tresiba 30 units daily, metformin 1000 mg 1 tablet twice daily, Ozempic 2 mg weekly Last Eye Exam:05/20243. Had recent cataract surgery and stents for glaucoma.  Eyes doing well  Foot checks: done.    Hyperlipidemia: Current medications: Pravastatin 40 mg daily  Hypertension: Complications: Current medications: Ramipril 2.5 mg daily, and spironolactone 25 mg daily  COPD: not using inhalers.   Diet: not healthy during the recent death of her brother. Doing better now.  Exercise:      02/05/2022    3:16 PM 07/18/2021   11:21 AM 07/18/2020   10:09 AM 07/13/2016   10:55 AM  Depression screen PHQ 2/9  Decreased Interest 0 0 0 0  Down, Depressed, Hopeless 0 0 0 0  PHQ - 2 Score 0 0 0 0        10/11/2022    9:55 AM  Fall Risk   Falls in the past year? 0  Number falls in past yr: 0  Injury with Fall? 0  Risk for fall due to : No Fall Risks    Patient Care Team: Blane Ohara, MD as PCP - General (Family Medicine) Zettie Pho, [facility] (Pharmacist)   Review of Systems  Constitutional:  Negative for chills, fatigue and fever.  HENT:  Negative for congestion, rhinorrhea and sore throat.   Respiratory:  Negative for cough and shortness of breath.   Cardiovascular:  Negative for chest pain.  Gastrointestinal:  Negative for abdominal pain, constipation, diarrhea, nausea and vomiting.  Genitourinary:  Negative for dysuria and urgency.  Musculoskeletal:  Positive for back pain. Negative for myalgias.  Neurological:  Negative for dizziness, weakness, light-headedness and headaches.  Psychiatric/Behavioral:  Negative for dysphoric mood. The patient is not nervous/anxious.      Current Outpatient Medications on File Prior to Visit  Medication Sig Dispense Refill   ACCU-CHEK AVIVA PLUS test strip 1 each by Other route 2 (two) times daily. Use as instructed 100 each 2   dapagliflozin propanediol (FARXIGA) 10 MG TABS tablet Take 1 tablet (10 mg total) by mouth daily before breakfast. 90 tablet 3   fluticasone (FLONASE) 50 MCG/ACT nasal spray Place 2 sprays into both nostrils daily. 48 g 3   metFORMIN (GLUCOPHAGE) 1000 MG tablet TAKE ONE TABLET BY MOUTH TWICE DAILY WITH A MEAL 180 tablet 1   montelukast (SINGULAIR) 10 MG tablet Take 1 tablet (10 mg total) by mouth at bedtime. 90 tablet 3   Multiple Vitamin (MULTIVITAMIN) tablet Take 1 tablet by mouth daily.     pravastatin (PRAVACHOL) 40 MG tablet TAKE ONE TABLET BY MOUTH EVERYDAY AT BEDTIME 90 tablet 1   ramipril (ALTACE) 2.5 MG capsule Take 1 capsule (2.5 mg total) by mouth every morning. 90 capsule 1   Semaglutide, 2 MG/DOSE, (OZEMPIC, 2 MG/DOSE,) 8 MG/3ML SOPN Inject 2 mg into the skin once a week. 9 mL 1   spironolactone (ALDACTONE) 25 MG tablet Take 1 tablet (25 mg total) by mouth every morning. 90 tablet 1   timolol (TIMOPTIC) 0.5 % ophthalmic solution 1 drop 2 (two) times daily.     TRESIBA FLEXTOUCH 200 UNIT/ML FlexTouch Pen INJECT 30 UNITS INTO THE SKIN DAILY. 18 mL 3  No current facility-administered medications on file prior to visit.   Past Medical History:  Diagnosis Date   Ankle pain    Back pain of lumbar region with sciatica 01/07/2020   Chronic pain    back   COPD (chronic obstructive pulmonary disease) (HCC)    Depression    Diabetes mellitus    GERD (gastroesophageal reflux disease)    Hyperlipidemia    Hypertension    Low back pain    Obstructive sleep apnea    Pneumonia 2010   Post-menopausal atrophic vaginitis    Superficial phlebitis    Past Surgical History:  Procedure Laterality Date   BACK SURGERY  1990   CESAREAN SECTION     CYST REMOVAL HAND     inner thigh    REFRACTIVE SURGERY Right 06/07/2022   SPINE SURGERY     lumbar laminectomy and diskectomy    Family History  Problem Relation Age of Onset   Diabetes Mother    Heart disease Mother    Cancer Father    Diabetes Sister    Stroke Sister    Lung disease Neg Hx    Social History   Socioeconomic History   Marital status: Single    Spouse name: Not on file   Number of children: Not on file   Years of education: Not on file   Highest education level: Not on file  Occupational History   Not on file  Tobacco Use   Smoking status: Former    Packs/day: 1.50    Years: 15.00    Additional pack years: 0.00    Total pack years: 22.50    Types: Cigarettes    Quit date: 03/20/2009    Years since quitting: 13.8   Smokeless tobacco: Never  Vaping Use   Vaping Use: Never used  Substance and Sexual Activity   Alcohol use: No   Drug use: No   Sexual activity: Not Currently    Partners: Male  Other Topics Concern   Not on file  Social History Narrative   Not on file   Social Determinants of Health   Financial Resource Strain: High Risk (08/15/2022)   Overall Financial Resource Strain (CARDIA)    Difficulty of Paying Living Expenses: Hard  Food Insecurity: No Food Insecurity (02/05/2022)   Hunger Vital Sign    Worried About Running Out of Food in the Last Year: Never true    Ran Out of Food in the Last Year: Never true  Transportation Needs: No Transportation Needs (08/15/2022)   PRAPARE - Administrator, Civil Service (Medical): No    Lack of Transportation (Non-Medical): No  Physical Activity: Inactive (02/05/2022)   Exercise Vital Sign    Days of Exercise per Week: 0 days    Minutes of Exercise per Session: 0 min  Stress: Not on file  Social Connections: Unknown (02/05/2022)   Social Connection and Isolation Panel [NHANES]    Frequency of Communication with Friends and Family: More than three times a week    Frequency of Social Gatherings with Friends and Family:  More than three times a week    Attends Religious Services: 1 to 4 times per year    Active Member of [employer] or Organizations: No    Attends Banker Meetings: Never    Marital Status: Not on file    Objective:  There were no vitals taken for this visit.     10/11/2022    9:55 AM 06/11/2022  10:48 AM 05/24/2022    9:25 AM  BP/Weight  Systolic BP 100 104 100  Diastolic BP 60 62 60  Wt. (Lbs) 219.2 204 199  BMI 37.63 kg/m2 36.14 kg/m2 35.25 kg/m2    Physical Exam Vitals reviewed.  Constitutional:      Appearance: Normal appearance. She is normal weight.  HENT:     Right Ear: Tympanic membrane, ear canal and external ear normal.     Left Ear: Tympanic membrane and external ear normal.     Nose: Nose normal.     Mouth/Throat:     Pharynx: Oropharynx is clear.  Cardiovascular:     Rate and Rhythm: Normal rate and regular rhythm.     Pulses: Normal pulses.     Heart sounds: Normal heart sounds. No murmur heard. Pulmonary:     Effort: Pulmonary effort is normal. No respiratory distress.     Breath sounds: Normal breath sounds.  Abdominal:     General: Abdomen is flat. Bowel sounds are normal.     Palpations: Abdomen is soft.     Tenderness: There is no abdominal tenderness.  Neurological:     Mental Status: She is alert and oriented to person, place, and time.  Psychiatric:        Mood and Affect: Mood normal.        Behavior: Behavior normal.     Diabetic Foot Exam - Simple   No data filed      Lab Results  Component Value Date   WBC 8.6 10/11/2022   HGB 14.0 10/11/2022   HCT 41.9 10/11/2022   PLT 238 10/11/2022   GLUCOSE 102 (H) 10/11/2022   CHOL 154 10/11/2022   TRIG 63 10/11/2022   HDL 62 10/11/2022   LDLCALC 79 10/11/2022   ALT 21 10/11/2022   AST 14 10/11/2022   NA 141 10/11/2022   K 4.1 10/11/2022   CL 104 10/11/2022   CREATININE 0.68 10/11/2022   BUN 20 10/11/2022   CO2 24 10/11/2022   TSH 1.500 11/22/2021   HGBA1C 7.0 (H)  10/11/2022   MICROALBUR 30 05/18/2021      Assessment & Plan:    Hypertension associated with diabetes (HCC)  Simple chronic bronchitis (HCC)  Diabetic polyneuropathy associated with type 2 diabetes mellitus (HCC)  Mixed hyperlipidemia     No orders of the defined types were placed in this encounter.   No orders of the defined types were placed in this encounter.    Follow-up: No follow-ups on file.   I,Marla I Leal-Borjas,acting as a scribe for Blane Ohara, MD.,have documented all relevant documentation on the behalf of Blane Ohara, MD,as directed by  Blane Ohara, MD while in the presence of Blane Ohara, MD.   An After Visit Summary was printed and given to the patient.  Blane Ohara, MD [facility] [phone]

## 2023-02-08 ENCOUNTER — Ambulatory Visit (INDEPENDENT_AMBULATORY_CARE_PROVIDER_SITE_OTHER): Payer: Medicare HMO | Admitting: Family Medicine

## 2023-02-08 VITALS — BP 106/60 | HR 78 | Temp 96.2°F | Resp 18 | Ht 64.0 in | Wt 216.0 lb

## 2023-02-08 DIAGNOSIS — E1142 Type 2 diabetes mellitus with diabetic polyneuropathy: Secondary | ICD-10-CM | POA: Diagnosis not present

## 2023-02-08 DIAGNOSIS — J410 Simple chronic bronchitis: Secondary | ICD-10-CM

## 2023-02-08 DIAGNOSIS — E782 Mixed hyperlipidemia: Secondary | ICD-10-CM | POA: Diagnosis not present

## 2023-02-08 DIAGNOSIS — I152 Hypertension secondary to endocrine disorders: Secondary | ICD-10-CM | POA: Diagnosis not present

## 2023-02-08 DIAGNOSIS — E1159 Type 2 diabetes mellitus with other circulatory complications: Secondary | ICD-10-CM

## 2023-02-08 DIAGNOSIS — I83813 Varicose veins of bilateral lower extremities with pain: Secondary | ICD-10-CM

## 2023-02-08 DIAGNOSIS — O2200 Varicose veins of lower extremity in pregnancy, unspecified trimester: Secondary | ICD-10-CM

## 2023-02-09 LAB — COMPREHENSIVE METABOLIC PANEL
ALT: 23 IU/L (ref 0–32)
AST: 14 IU/L (ref 0–40)
Albumin/Globulin Ratio: 1.8 (ref 1.2–2.2)
Albumin: 4.5 g/dL (ref 3.9–4.9)
Alkaline Phosphatase: 61 IU/L (ref 44–121)
BUN/Creatinine Ratio: 29 — ABNORMAL HIGH (ref 12–28)
BUN: 21 mg/dL (ref 8–27)
Bilirubin Total: 0.5 mg/dL (ref 0.0–1.2)
CO2: 23 mmol/L (ref 20–29)
Calcium: 10.2 mg/dL (ref 8.7–10.3)
Chloride: 102 mmol/L (ref 96–106)
Creatinine, Ser: 0.73 mg/dL (ref 0.57–1.00)
Globulin, Total: 2.5 g/dL (ref 1.5–4.5)
Glucose: 146 mg/dL — ABNORMAL HIGH (ref 70–99)
Potassium: 4.8 mmol/L (ref 3.5–5.2)
Sodium: 141 mmol/L (ref 134–144)
Total Protein: 7 g/dL (ref 6.0–8.5)
eGFR: 93 mL/min/{1.73_m2} (ref >59)

## 2023-02-09 LAB — CBC WITH DIFFERENTIAL/PLATELET
Basophils Absolute: 0 10*3/uL (ref 0.0–0.2)
Basos: 0 %
EOS (ABSOLUTE): 0.3 10*3/uL (ref 0.0–0.4)
Eos: 3 %
Hematocrit: 47.8 % — ABNORMAL HIGH (ref 34.0–46.6)
Hemoglobin: 16 g/dL — ABNORMAL HIGH (ref 11.1–15.9)
Immature Grans (Abs): 0 10*3/uL (ref 0.0–0.1)
Immature Granulocytes: 0 %
Lymphocytes Absolute: 2.7 10*3/uL (ref 0.7–3.1)
Lymphs: 23 %
MCH: 28.9 pg (ref 26.6–33.0)
MCHC: 33.5 g/dL (ref 31.5–35.7)
MCV: 86 fL (ref 79–97)
Monocytes Absolute: 0.9 10*3/uL (ref 0.1–0.9)
Monocytes: 7 %
Neutrophils Absolute: 7.8 10*3/uL — ABNORMAL HIGH (ref 1.4–7.0)
Neutrophils: 67 %
Platelets: 301 10*3/uL (ref 150–450)
RBC: 5.53 x10E6/uL — ABNORMAL HIGH (ref 3.77–5.28)
RDW: 13 % (ref 11.7–15.4)
WBC: 11.8 10*3/uL — ABNORMAL HIGH (ref 3.4–10.8)

## 2023-02-09 LAB — LIPID PANEL
Chol/HDL Ratio: 3 ratio (ref 0.0–4.4)
Cholesterol, Total: 176 mg/dL (ref 100–199)
HDL: 59 mg/dL (ref >39)
LDL Chol Calc (NIH): 100 mg/dL — ABNORMAL HIGH (ref 0–99)
Triglycerides: 93 mg/dL (ref 0–149)
VLDL Cholesterol Cal: 17 mg/dL (ref 5–40)

## 2023-02-09 LAB — CARDIOVASCULAR RISK ASSESSMENT

## 2023-02-09 LAB — HEMOGLOBIN A1C
Est. average glucose Bld gHb Est-mCnc: 197 mg/dL
Hgb A1c MFr Bld: 8.5 % — ABNORMAL HIGH (ref 4.8–5.6)

## 2023-02-10 DIAGNOSIS — O2200 Varicose veins of lower extremity in pregnancy, unspecified trimester: Secondary | ICD-10-CM | POA: Insufficient documentation

## 2023-02-10 NOTE — Assessment & Plan Note (Signed)
Not currently in need of inhalers. 

## 2023-02-10 NOTE — Assessment & Plan Note (Signed)
Well controlled.  No changes to medicines. Pravastatin 40 mg daily Continue to work on eating a healthy diet and exercise.  Labs drawn today.   

## 2023-02-10 NOTE — Assessment & Plan Note (Signed)
Referral to vascular surgery  

## 2023-02-10 NOTE — Assessment & Plan Note (Signed)
Control: good Recommend check sugars fasting daily. Recommend check feet daily. Recommend annual eye exams. Medicines: Farxiga 10 mg daily, Tresiba 30 units daily, metformin 1000 mg 1 tablet twice daily, Ozempic 2 mg weekly Continue to work on eating a healthy diet and exercise.  Labs drawn today.

## 2023-02-10 NOTE — Assessment & Plan Note (Addendum)
Well controlled.  No changes to medicines. Ramipril 2.5 mg daily, and spironolactone 25 mg daily Continue to work on eating a healthy diet and exercise.  Labs drawn today.

## 2023-02-11 ENCOUNTER — Encounter: Payer: Self-pay | Admitting: Family Medicine

## 2023-02-12 ENCOUNTER — Other Ambulatory Visit: Payer: Self-pay

## 2023-02-12 DIAGNOSIS — D72828 Other elevated white blood cell count: Secondary | ICD-10-CM

## 2023-02-12 MED ORDER — EZETIMIBE 10 MG PO TABS: 10.0000 mg | ORAL_TABLET | Freq: Every day | ORAL | 0 refills | Status: DC

## 2023-02-15 ENCOUNTER — Telehealth: Payer: Self-pay

## 2023-02-15 NOTE — Progress Notes (Unsigned)
Care Management & Coordination Services Pharmacy Team  Reason for Encounter: Medication coordination and delivery  Contacted patient to discuss medications and coordinate delivery from [facility]. {US HC Outreach:28874} Cycle dispensing form sent to *** for review.   Last adherence delivery date:01/24/23      Patient is due for next adherence delivery on: 02/26/23  This delivery to include: Adherence Packaging  30 Days  Metformin HCI 1000mg  1 B, 1 EM Spironolactone 25mg  1 BB Pravastatin 40mg  1 BT Ramipril 2.5mg  1 B Montelukast 10mg -Take 1 tablet (10 mg total) by mouth at bedtime. (Bottles) Flonase Nasal Spray-PRN Ezetimibe 10mg - 1 B  Patient declined the following medications this month: Test Strips-Pt does not need right now. Does not check everyday Patient gets Patient Assistance for the following: Farxiga 10mg -LF sent 5 boxes in April  Ozempic 2 mg-LF 12/14/22 sent 3 boxes  Tresiba 200 units-LF 01/14/23 sent 5 boxes   No refill request needed.  {Delivery ZOXW:96045}   Any concerns about your medications? {yes/no:20286}  How often do you forget or accidentally miss a dose? {Missed doses:25554}  Do you use a pillbox? No  Is patient in packaging Yes  If yes  What is the date on your next pill pack?  Any concerns or issues with your packaging?   Recent blood pressure readings are as follows:02/08/23 106/60  Recent blood glucose readings are as follows:   Chart review: Recent office visits:  02/12/23 Leal-Borjas Marla CMA. Orders Only. Ordered Ezetimibe 10mg  daily.   02/08/23 Blane Ohara MD. Seen for follow up. Referral to Vascular Surgery. D/C Timolol Maleate.   Recent consult visits:  None  Hospital visits:  None  Medications: Outpatient Encounter Medications as of 02/15/2023  Medication Sig   ezetimibe (ZETIA) 10 MG tablet Take 1 tablet (10 mg total) by mouth daily.   ACCU-CHEK AVIVA PLUS test strip 1 each by Other route 2 (two) times daily. Use as  instructed   dapagliflozin propanediol (FARXIGA) 10 MG TABS tablet Take 1 tablet (10 mg total) by mouth daily before breakfast.   fluticasone (FLONASE) 50 MCG/ACT nasal spray Place 2 sprays into both nostrils daily.   metFORMIN (GLUCOPHAGE) 1000 MG tablet TAKE ONE TABLET BY MOUTH TWICE DAILY WITH A MEAL   montelukast (SINGULAIR) 10 MG tablet Take 1 tablet (10 mg total) by mouth at bedtime.   Multiple Vitamin (MULTIVITAMIN) tablet Take 1 tablet by mouth daily.   pravastatin (PRAVACHOL) 40 MG tablet TAKE ONE TABLET BY MOUTH EVERYDAY AT BEDTIME   ramipril (ALTACE) 2.5 MG capsule Take 1 capsule (2.5 mg total) by mouth every morning.   Semaglutide, 2 MG/DOSE, (OZEMPIC, 2 MG/DOSE,) 8 MG/3ML SOPN Inject 2 mg into the skin once a week.   spironolactone (ALDACTONE) 25 MG tablet Take 1 tablet (25 mg total) by mouth every morning.   TRESIBA FLEXTOUCH 200 UNIT/ML FlexTouch Pen INJECT 30 UNITS INTO THE SKIN DAILY.   No facility-administered encounter medications on file as of 02/15/2023.   BP Readings from Last 3 Encounters:  02/08/23 106/60  10/11/22 100/60  06/11/22 104/62    Pulse Readings from Last 3 Encounters:  02/08/23 78  10/11/22 75  06/11/22 85    Lab Results  Component Value Date/Time   HGBA1C 8.5 (H) 02/08/2023 10:01 AM   HGBA1C 7.0 (H) 10/11/2022 10:51 AM   Lab Results  Component Value Date   CREATININE 0.73 02/08/2023   BUN 21 02/08/2023   GFRNONAA 91 10/27/2020   GFRAA 105 10/27/2020   NA 141  02/08/2023   K 4.8 02/08/2023   CALCIUM 10.2 02/08/2023   CO2 23 02/08/2023     Roxana Hires, CMA Clinical Pharmacist Assistant  [phone]

## 2023-02-26 ENCOUNTER — Other Ambulatory Visit: Payer: Medicare HMO

## 2023-02-26 DIAGNOSIS — D72828 Other elevated white blood cell count: Secondary | ICD-10-CM | POA: Diagnosis not present

## 2023-02-26 LAB — CBC WITH DIFFERENTIAL/PLATELET
Basophils Absolute: 0 10*3/uL (ref 0.0–0.2)
Basos: 0 %
EOS (ABSOLUTE): 0.3 10*3/uL (ref 0.0–0.4)
Eos: 4 %
Hematocrit: 43.4 % (ref 34.0–46.6)
Hemoglobin: 14.4 g/dL (ref 11.1–15.9)
Immature Grans (Abs): 0 10*3/uL (ref 0.0–0.1)
Immature Granulocytes: 0 %
Lymphocytes Absolute: 2.7 10*3/uL (ref 0.7–3.1)
Lymphs: 29 %
MCH: 28.8 pg (ref 26.6–33.0)
MCHC: 33.2 g/dL (ref 31.5–35.7)
MCV: 87 fL (ref 79–97)
Monocytes Absolute: 0.8 10*3/uL (ref 0.1–0.9)
Monocytes: 8 %
Neutrophils Absolute: 5.7 10*3/uL (ref 1.4–7.0)
Neutrophils: 59 %
Platelets: 267 10*3/uL (ref 150–450)
RBC: 5 x10E6/uL (ref 3.77–5.28)
RDW: 13.5 % (ref 11.7–15.4)
WBC: 9.5 10*3/uL (ref 3.4–10.8)

## 2023-02-27 NOTE — Telephone Encounter (Cosign Needed)
02/27/23- Lvm for a returned call   Roxana Hires, [facility] Clinical Pharmacist Assistant  [phone]

## 2023-03-04 ENCOUNTER — Other Ambulatory Visit: Payer: Self-pay | Admitting: Family Medicine

## 2023-03-04 DIAGNOSIS — E1142 Type 2 diabetes mellitus with diabetic polyneuropathy: Secondary | ICD-10-CM

## 2023-03-08 ENCOUNTER — Other Ambulatory Visit: Payer: Self-pay | Admitting: *Deleted

## 2023-03-08 ENCOUNTER — Other Ambulatory Visit: Payer: Self-pay | Admitting: Family Medicine

## 2023-03-08 DIAGNOSIS — E1142 Type 2 diabetes mellitus with diabetic polyneuropathy: Secondary | ICD-10-CM

## 2023-03-08 DIAGNOSIS — I83813 Varicose veins of bilateral lower extremities with pain: Secondary | ICD-10-CM

## 2023-03-13 ENCOUNTER — Other Ambulatory Visit: Payer: Self-pay | Admitting: Family Medicine

## 2023-03-13 DIAGNOSIS — E782 Mixed hyperlipidemia: Secondary | ICD-10-CM

## 2023-03-15 ENCOUNTER — Other Ambulatory Visit: Payer: Self-pay | Admitting: Family Medicine

## 2023-03-15 DIAGNOSIS — J302 Other seasonal allergic rhinitis: Secondary | ICD-10-CM

## 2023-03-20 ENCOUNTER — Ambulatory Visit (HOSPITAL_COMMUNITY)
Admission: RE | Admit: 2023-03-20 | Discharge: 2023-03-20 | Disposition: A | Discharge status: Home / Self Care | Payer: Medicare HMO | Source: Ambulatory Visit | Attending: Vascular Surgery | Admitting: Vascular Surgery

## 2023-03-20 ENCOUNTER — Ambulatory Visit (INDEPENDENT_AMBULATORY_CARE_PROVIDER_SITE_OTHER): Payer: Medicare HMO | Admitting: Vascular Surgery

## 2023-03-20 ENCOUNTER — Encounter: Payer: Self-pay | Admitting: Vascular Surgery

## 2023-03-20 VITALS — BP 114/73 | HR 83 | Temp 97.6°F | Resp 16 | Ht 64.0 in | Wt 219.0 lb

## 2023-03-20 DIAGNOSIS — I83813 Varicose veins of bilateral lower extremities with pain: Secondary | ICD-10-CM

## 2023-03-20 DIAGNOSIS — I872 Venous insufficiency (chronic) (peripheral): Secondary | ICD-10-CM | POA: Diagnosis not present

## 2023-03-20 NOTE — Progress Notes (Signed)
REASON FOR VISIT:   Varicose veins.  The consult is requested by Dr. Blane Ohara.   MEDICAL ISSUES:   CHRONIC VENOUS INSUFFICIENCY: This patient has CEAP C4 venous disease (hyperpigmentation).  She has deep venous reflux but no significant superficial venous reflux.  I have encouraged her to avoid prolonged sitting and standing.  We discussed the importance of exercise specifically walking and water aerobics.  I recommended knee-high compression stockings with a gradient of 15 to 20 mmHg.  We discussed the importance of daily leg elevation and the proper positioning for this.  We also discussed the importance of maintaining a healthy weight as central obesity especially increases lower extremity venous pressure.  Currently she is not a candidate for laser ablation as she does not have any significant superficial venous reflux.  If she has any recurrent episodes of bleeding I think she would be a candidate for sclerotherapy.  She will call if her symptoms progress.  HPI:   Brandi Velazquez is a pleasant 62 y.o. female who was last seen in our office in August 2022 by Dr. Darrick Penna.  She had some bleeding from some small telangiectasias around her left ankle.  Since that time she has had no further bleeding episodes.  She has not had any previous venous procedures.  She denies any previous history of DVT.  She does not wear compression stockings because they hurt.  She does elevate her legs some.  She does admit to some aching pain and heaviness in her legs associated with prolonged sitting and standing and relieved with elevation.  Her main concern was the discoloration in her lower legs which has gradually gotten worse.  She has hyperpigmentation.  She also has telangiectasias in her ankles.  Past Medical History:  Diagnosis Date   Ankle pain    Back pain of lumbar region with sciatica 01/07/2020   Chronic pain    back   COPD (chronic obstructive pulmonary disease) (HCC)    Depression     Diabetes mellitus    GERD (gastroesophageal reflux disease)    Hyperlipidemia    Hypertension    Low back pain    Obstructive sleep apnea    Pneumonia 2010   Post-menopausal atrophic vaginitis    Superficial phlebitis     Family History  Problem Relation Age of Onset   Diabetes Mother    Heart disease Mother    Cancer Father    Diabetes Sister    Stroke Sister    Lung disease Neg Hx     SOCIAL HISTORY: Social History   Tobacco Use   Smoking status: Former    Packs/day: 1.50    Years: 15.00    Additional pack years: 0.00    Total pack years: 22.50    Types: Cigarettes    Quit date: 03/20/2009    Years since quitting: 14.0   Smokeless tobacco: Never  Substance Use Topics   Alcohol use: No    Allergies  Allergen Reactions   Bactrim [Sulfamethoxazole-Trimethoprim] Swelling   Morphine And Codeine     Current Outpatient Medications  Medication Sig Dispense Refill   ACCU-CHEK AVIVA PLUS test strip 1 each by Other route 2 (two) times daily. Use as instructed 100 each 2   ezetimibe (ZETIA) 10 MG tablet Take 1 tablet (10 mg total) by mouth daily. 90 tablet 0   FARXIGA 10 MG TABS tablet TAKE 1 TABLET EVERY DAY BEFORE BREAKFAST 90 tablet 1   fluticasone (FLONASE) 50 MCG/ACT  nasal spray Place 2 sprays into both nostrils daily. 48 g 3   metFORMIN (GLUCOPHAGE) 1000 MG tablet TAKE ONE TABLET BY MOUTH TWICE DAILY WITH A MEAL 180 tablet 1   montelukast (SINGULAIR) 10 MG tablet TAKE 1 TABLET BY MOUTH AT BEDTIME 90 tablet 3   Multiple Vitamin (MULTIVITAMIN) tablet Take 1 tablet by mouth daily.     pravastatin (PRAVACHOL) 40 MG tablet TAKE ONE TABLET BY MOUTH EVERYDAY AT BEDTIME 90 tablet 1   ramipril (ALTACE) 2.5 MG capsule TAKE ONE CAPSULE BY MOUTH EVERY MORNING 90 capsule 1   Semaglutide, 2 MG/DOSE, (OZEMPIC, 2 MG/DOSE,) 8 MG/3ML SOPN INJECT 2 MG INTO THE SKIN ONCE A WEEK. 3 mL 1   spironolactone (ALDACTONE) 25 MG tablet TAKE ONE TABLET BY MOUTH BEFORE BREAKFAST 90 tablet 1    TRESIBA FLEXTOUCH 200 UNIT/ML FlexTouch Pen INJECT 30 UNITS INTO THE SKIN DAILY. 18 mL 3   No current facility-administered medications for this visit.    REVIEW OF SYSTEMS:  [X]  denotes positive finding, [ ]  denotes negative finding Cardiac  Comments:  Chest pain or chest pressure:    Shortness of breath upon exertion:    Short of breath when lying flat:    Irregular heart rhythm:        Vascular    Pain in calf, thigh, or hip brought on by ambulation:    Pain in feet at night that wakes you up from your sleep:     Blood clot in your veins:    Leg swelling:         Pulmonary    Oxygen at home:    Productive cough:     Wheezing:         Neurologic    Sudden weakness in arms or legs:     Sudden numbness in arms or legs:     Sudden onset of difficulty speaking or slurred speech:    Temporary loss of vision in one eye:     Problems with dizziness:         Gastrointestinal    Blood in stool:     Vomited blood:         Genitourinary    Burning when urinating:     Blood in urine:        Psychiatric    Major depression:         Hematologic    Bleeding problems:    Problems with blood clotting too easily:        Skin    Rashes or ulcers:        Constitutional    Fever or chills:     PHYSICAL EXAM:   Vitals:   03/20/23 1601  BP: 114/73  Pulse: 83  Resp: 16  Temp: 97.6 F (36.4 C)  TempSrc: Temporal  SpO2: 97%  Weight: 219 lb (99.3 kg)  Height: 5\' 4"  (1.626 m)    GENERAL: The patient is a well-nourished female, in no acute distress. The vital signs are documented above. CARDIAC: There is a regular rate and rhythm.  VASCULAR: I do not detect carotid bruits. I cannot palpate pedal pulses however she has biphasic Doppler signals in both feet and the dorsalis pedis and posterior tibial positions. She has telangiectasias and hyperpigmentation bilaterally.     I did look at her right great saphenous vein myself with the SonoSite and it was not significantly  dilated. PULMONARY: There is good air exchange bilaterally without wheezing or rales. ABDOMEN: Soft  and non-tender with normal pitched bowel sounds.  MUSCULOSKELETAL: There are no major deformities or cyanosis. NEUROLOGIC: No focal weakness or paresthesias are detected. SKIN: There are no ulcers or rashes noted. PSYCHIATRIC: The patient has a normal affect.  DATA:    VENOUS DUPLEX: I have independently interpreted the patient's venous duplex scan from today.  This was of the left lower extremity only.  There was no evidence of DVT.  There was deep venous reflux in the common femoral vein and femoral vein.  There was no significant superficial venous reflux.  Waverly Ferrari [facility] [phone]

## 2023-03-25 ENCOUNTER — Other Ambulatory Visit: Payer: Self-pay

## 2023-03-25 MED ORDER — METFORMIN HCL 1000 MG PO TABS: 1000.0000 mg | ORAL_TABLET | Freq: Two times a day (BID) | ORAL | 1 refills | Status: DC

## 2023-04-16 DIAGNOSIS — Z794 Long term (current) use of insulin: Secondary | ICD-10-CM | POA: Diagnosis not present

## 2023-04-16 DIAGNOSIS — E119 Type 2 diabetes mellitus without complications: Secondary | ICD-10-CM | POA: Diagnosis not present

## 2023-04-16 LAB — HM DIABETES EYE EXAM

## 2023-04-22 ENCOUNTER — Other Ambulatory Visit: Payer: Self-pay | Admitting: Family Medicine

## 2023-04-22 DIAGNOSIS — E1142 Type 2 diabetes mellitus with diabetic polyneuropathy: Secondary | ICD-10-CM

## 2023-04-24 ENCOUNTER — Other Ambulatory Visit: Payer: Self-pay | Admitting: Family Medicine

## 2023-05-24 ENCOUNTER — Other Ambulatory Visit: Payer: Self-pay | Admitting: Family Medicine

## 2023-05-28 ENCOUNTER — Other Ambulatory Visit: Payer: Self-pay

## 2023-05-28 MED ORDER — EZETIMIBE 10 MG PO TABS: 10.0000 mg | ORAL_TABLET | Freq: Every day | ORAL | 2 refills | Status: DC

## 2023-05-28 NOTE — Progress Notes (Unsigned)
Subjective:  Patient ID: Brandi Velazquez, female    DOB: Dec 05, 1960  Age: 62 y.o. MRN: [id number]  Chief Complaint  Patient presents with   Medical Management of Chronic Issues    HPI Diabetes:  Complications:neuropathy Glucose checking: no Glucose logs: nausea/a Hypoglycemia: no symptoms Most recent A1C: 8.5 Current medications: Farxiga 10 mg daily, Tresiba 30 units daily, metformin 1000 mg 1 tablet twice daily, Ozempic 2 mg weekly.  Last Eye Exam: 04/16/23. Foot checks: done.    Hyperlipidemia: Current medications: Pravastatin 40 mg daily  Hypertension: Complications: Current medications: Ramipril 2.5 mg daily, and spironolactone 25 mg daily  COPD: not using inhalers.   Diet: eating healthier and appetite suppressed. Exercise: runs after grandhildren.     05/29/2023    9:32 AM 02/08/2023    9:38 AM 02/05/2022    3:16 PM 07/18/2021   11:21 AM 07/18/2020   10:09 AM  Depression screen PHQ 2/9  Decreased Interest 0 0 0 0 0  Down, Depressed, Hopeless 0 0 0 0 0  PHQ - 2 Score 0 0 0 0 0  Altered sleeping 0 0     Tired, decreased energy 1 0     Change in appetite 1 1     Feeling bad or failure about yourself  0 0     Trouble concentrating 0 0     Moving slowly or fidgety/restless 0 0     Suicidal thoughts 0 0     PHQ-9 Score 2 1     Difficult doing work/chores Not difficult at all Not difficult at all           05/29/2023    9:31 AM  Fall Risk   Falls in the past year? 1  Number falls in past yr: 1  Injury with Fall? 1  Risk for fall due to : No Fall Risks  Follow up Falls evaluation completed;Falls prevention discussed    Patient Care Team: Blane Ohara, MD as PCP - General (Family Medicine) Zettie Pho, [facility] (Inactive) (Pharmacist)   Review of Systems  Constitutional:  Negative for chills, fatigue and fever.  HENT:  Negative for congestion, ear pain, rhinorrhea and sore throat.   Respiratory:  Negative for cough and shortness of breath.    Cardiovascular:  Negative for chest pain.  Gastrointestinal:  Positive for abdominal pain, nausea and vomiting. Negative for constipation and diarrhea.  Genitourinary:  Negative for dysuria and urgency.  Musculoskeletal:  Positive for back pain. Negative for myalgias.  Neurological:  Negative for dizziness, weakness, light-headedness and headaches.  Psychiatric/Behavioral:  Negative for dysphoric mood. The patient is not nervous/anxious.     Current Outpatient Medications on File Prior to Visit  Medication Sig Dispense Refill   ACCU-CHEK AVIVA PLUS test strip 1 each by Other route 2 (two) times daily. Use as instructed 100 each 2   ezetimibe (ZETIA) 10 MG tablet Take 1 tablet (10 mg total) by mouth daily. 30 tablet 2   FARXIGA 10 MG TABS tablet TAKE 1 TABLET EVERY DAY BEFORE BREAKFAST 90 tablet 1   fluticasone (FLONASE) 50 MCG/ACT nasal spray Place 2 sprays into both nostrils daily. 48 g 3   metFORMIN (GLUCOPHAGE) 1000 MG tablet Take 1 tablet (1,000 mg total) by mouth 2 (two) times daily with a meal. 180 tablet 1   montelukast (SINGULAIR) 10 MG tablet TAKE 1 TABLET BY MOUTH AT BEDTIME 90 tablet 3   Multiple Vitamin (MULTIVITAMIN) tablet Take 1 tablet by mouth daily.  pravastatin (PRAVACHOL) 40 MG tablet TAKE ONE TABLET BY MOUTH EVERYDAY AT BEDTIME 90 tablet 1   ramipril (ALTACE) 2.5 MG capsule TAKE ONE CAPSULE BY MOUTH EVERY MORNING 90 capsule 1   Semaglutide, 2 MG/DOSE, (OZEMPIC, 2 MG/DOSE,) 8 MG/3ML SOPN INJECT 2 MG INTO THE SKIN ONCE A WEEK. 3 mL 5   spironolactone (ALDACTONE) 25 MG tablet TAKE ONE TABLET BY MOUTH BEFORE BREAKFAST 90 tablet 1   No current facility-administered medications on file prior to visit.   Past Medical History:  Diagnosis Date   Ankle pain    Back pain of lumbar region with sciatica 01/07/2020   Chronic pain    back   COPD (chronic obstructive pulmonary disease) (HCC)    Depression    Diabetes mellitus    GERD (gastroesophageal reflux disease)     Hyperlipidemia    Hypertension    Low back pain    Obstructive sleep apnea    Pneumonia 2010   Post-menopausal atrophic vaginitis    Superficial phlebitis    Past Surgical History:  Procedure Laterality Date   BACK SURGERY  1990   CESAREAN SECTION     CYST REMOVAL HAND     inner thigh   REFRACTIVE SURGERY Right 06/07/2022   SPINE SURGERY     lumbar laminectomy and diskectomy    Family History  Problem Relation Age of Onset   Diabetes Mother    Heart disease Mother    Cancer Father    Diabetes Sister    Stroke Sister    Lung disease Neg Hx    Social History   Socioeconomic History   Marital status: Single    Spouse name: Not on file   Number of children: Not on file   Years of education: Not on file   Highest education level: Not on file  Occupational History   Not on file  Tobacco Use   Smoking status: Former    Current packs/day: 0.00    Average packs/day: 1.5 packs/day for 15.0 years (22.5 ttl pk-yrs)    Types: Cigarettes    Start date: 03/20/1994    Quit date: 03/20/2009    Years since quitting: 14.2   Smokeless tobacco: Never  Vaping Use   Vaping status: Never Used  Substance and Sexual Activity   Alcohol use: No   Drug use: No   Sexual activity: Not Currently    Partners: Male  Other Topics Concern   Not on file  Social History Narrative   Not on file   Social Determinants of Health   Financial Resource Strain: High Risk (08/15/2022)   Overall Financial Resource Strain (CARDIA)    Difficulty of Paying Living Expenses: Hard  Food Insecurity: No Food Insecurity (05/29/2023)   Hunger Vital Sign    Worried About Running Out of Food in the Last Year: Never true    Ran Out of Food in the Last Year: Never true  Transportation Needs: No Transportation Needs (05/29/2023)   PRAPARE - Administrator, Civil Service (Medical): No    Lack of Transportation (Non-Medical): No  Physical Activity: Inactive (05/29/2023)   Exercise Vital Sign    Days of  Exercise per Week: 0 days    Minutes of Exercise per Session: 0 min  Stress: No Stress Concern Present (05/29/2023)   [employer] of Occupational Health - Occupational Stress Questionnaire    Feeling of Stress : Not at all  Social Connections: Moderately Isolated (05/29/2023)   Social Connection and  Isolation Panel [NHANES]    Frequency of Communication with Friends and Family: More than three times a week    Frequency of Social Gatherings with Friends and Family: More than three times a week    Attends Religious Services: More than 4 times per year    Active Member of [employer] or Organizations: No    Attends Engineer, structural: Not on file    Marital Status: Divorced    Objective:  BP (!) 122/56   Pulse 80   Temp 98.4 F (36.9 C)   Resp 18   Ht 5\' 1"  (1.549 m)   Wt 205 lb (93 kg)   BMI 38.73 kg/m      05/29/2023    9:29 AM 03/20/2023    4:01 PM 02/08/2023    9:34 AM  BP/Weight  Systolic BP 122 114 106  Diastolic BP 56 73 60  Wt. (Lbs) 205 219 216  BMI 38.73 kg/m2 37.59 kg/m2 37.08 kg/m2    Physical Exam Vitals reviewed.  Constitutional:      Appearance: Normal appearance. She is obese.  Neck:     Vascular: No carotid bruit.  Cardiovascular:     Rate and Rhythm: Normal rate and regular rhythm.     Heart sounds: Normal heart sounds.  Pulmonary:     Effort: Pulmonary effort is normal. No respiratory distress.     Breath sounds: Normal breath sounds.  Abdominal:     General: Abdomen is flat. Bowel sounds are normal.     Palpations: Abdomen is soft.     Tenderness: There is no abdominal tenderness.  Neurological:     Mental Status: She is alert and oriented to person, place, and time.  Psychiatric:        Mood and Affect: Mood normal.        Behavior: Behavior normal.     Diabetic Foot Exam - Simple   No data filed      Lab Results  Component Value Date   WBC 10.2 05/29/2023   HGB 15.8 05/29/2023   HCT 48.7 (H) 05/29/2023   PLT 303  05/29/2023   GLUCOSE 86 05/29/2023   CHOL 108 05/29/2023   TRIG 79 05/29/2023   HDL 51 05/29/2023   LDLCALC 41 05/29/2023   ALT 21 05/29/2023   AST 19 05/29/2023   NA 142 05/29/2023   K 4.8 05/29/2023   CL 103 05/29/2023   CREATININE 0.68 05/29/2023   BUN 15 05/29/2023   CO2 23 05/29/2023   TSH 1.500 11/22/2021   HGBA1C 7.5 (H) 05/29/2023   MICROALBUR 30 05/18/2021      Assessment & Plan:    Hypertension associated with diabetes (HCC) Assessment & Plan: Well controlled.  No changes to medicines. Ramipril 2.5 mg daily, and spironolactone 25 mg daily Continue to work on eating a healthy diet and exercise.  Labs drawn today.    Orders: -     CBC with Differential/Platelet -     Comprehensive metabolic panel  Diabetic polyneuropathy associated with type 2 diabetes mellitus (HCC) Assessment & Plan: Control: good Recommend check sugars fasting daily. Recommend check feet daily. Recommend annual eye exams. Medicines: Farxiga 10 mg daily, Tresiba 30 units daily, metformin 1000 mg 1 tablet twice daily, Ozempic 2 mg weekly Continue to work on eating a healthy diet and exercise.  Labs drawn today.     Orders: -     Hemoglobin A1c  Mixed hyperlipidemia Assessment & Plan: Well controlled.  No changes  to medicines. Pravastatin 40 mg daily Continue to work on eating a healthy diet and exercise.  Labs drawn today.    Orders: -     Lipid panel  Need for influenza vaccination  Tuberculosis screening -     TB Skin Test  Encounter for Medicare annual wellness exam Assessment & Plan: Things to do to keep yourself healthy  - Exercise at least 30-45 minutes a day, 3-4 days a week.  - Eat a low-fat diet with lots of fruits and vegetables, up to 7-9 servings per day.  - Seatbelts can save your life. Wear them always.  - Smoke detectors on every level of your home, check batteries every year.  - Eye Doctor - have an eye exam every 1-2 years  - Safe sex - if you may be  exposed to STDs, use a condom.  - Alcohol -  If you drink, do it moderately, less than 2 drinks per day.  - Health Care Power of Attorney. Choose someone to speak for you if you are not able.  - Depression is common in our stressful world.If you're feeling down or losing interest in things you normally enjoy, please come in for a visit.  - Violence - If anyone is threatening or hurting you, please call immediately.    Encounter for immunization -     Influenza, MDCK, trivalent, PF(Flucelvax egg-free)     No orders of the defined types were placed in this encounter.   Orders Placed This Encounter  Procedures   Influenza, MDCK, trivalent, PF(Flucelvax egg-free)   CBC with Differential/Platelet   Comprehensive metabolic panel   Hemoglobin A1c   Lipid panel   TB Skin Test     Follow-up: Return in 1 year (on 05/28/2024).   I,Ezra Marquess I Leal-Borjas,acting as a scribe for Blane Ohara, MD.,have documented all relevant documentation on the behalf of Blane Ohara, MD,as directed by  Blane Ohara, MD while in the presence of Blane Ohara, MD.   An After Visit Summary was printed and given to the patient.  Blane Ohara, MD [name] [phone]

## 2023-05-28 NOTE — Assessment & Plan Note (Signed)
Well controlled.  No changes to medicines. Pravastatin 40 mg daily Continue to work on eating a healthy diet and exercise.  Labs drawn today.

## 2023-05-28 NOTE — Assessment & Plan Note (Signed)
Control: good Recommend check sugars fasting daily. Recommend check feet daily. Recommend annual eye exams. Medicines: Farxiga 10 mg daily, Tresiba 30 units daily, metformin 1000 mg 1 tablet twice daily, Ozempic 2 mg weekly Continue to work on eating a healthy diet and exercise.  Labs drawn today.

## 2023-05-28 NOTE — Assessment & Plan Note (Signed)
Well controlled.  No changes to medicines. Ramipril 2.5 mg daily, and spironolactone 25 mg daily Continue to work on eating a healthy diet and exercise.  Labs drawn today.

## 2023-05-29 ENCOUNTER — Encounter: Payer: Self-pay | Admitting: Family Medicine

## 2023-05-29 ENCOUNTER — Telehealth: Payer: Self-pay

## 2023-05-29 ENCOUNTER — Ambulatory Visit (INDEPENDENT_AMBULATORY_CARE_PROVIDER_SITE_OTHER): Payer: Medicare HMO | Admitting: Family Medicine

## 2023-05-29 VITALS — BP 122/56 | HR 80 | Temp 98.4°F | Resp 18 | Ht 61.0 in | Wt 205.0 lb

## 2023-05-29 DIAGNOSIS — E782 Mixed hyperlipidemia: Secondary | ICD-10-CM

## 2023-05-29 DIAGNOSIS — Z111 Encounter for screening for respiratory tuberculosis: Secondary | ICD-10-CM | POA: Diagnosis not present

## 2023-05-29 DIAGNOSIS — E1142 Type 2 diabetes mellitus with diabetic polyneuropathy: Secondary | ICD-10-CM

## 2023-05-29 DIAGNOSIS — Z23 Encounter for immunization: Secondary | ICD-10-CM | POA: Diagnosis not present

## 2023-05-29 DIAGNOSIS — Z0000 Encounter for general adult medical examination without abnormal findings: Secondary | ICD-10-CM

## 2023-05-29 DIAGNOSIS — Z6838 Body mass index (BMI) 38.0-38.9, adult: Secondary | ICD-10-CM | POA: Diagnosis not present

## 2023-05-29 DIAGNOSIS — E1159 Type 2 diabetes mellitus with other circulatory complications: Secondary | ICD-10-CM | POA: Diagnosis not present

## 2023-05-29 DIAGNOSIS — J449 Chronic obstructive pulmonary disease, unspecified: Secondary | ICD-10-CM

## 2023-05-29 DIAGNOSIS — I152 Hypertension secondary to endocrine disorders: Secondary | ICD-10-CM

## 2023-05-29 LAB — COMPREHENSIVE METABOLIC PANEL
ALT: 21 IU/L (ref 0–32)
AST: 19 IU/L (ref 0–40)
Albumin: 4.6 g/dL (ref 3.9–4.9)
Alkaline Phosphatase: 56 IU/L (ref 44–121)
BUN/Creatinine Ratio: 22 (ref 12–28)
BUN: 15 mg/dL (ref 8–27)
Bilirubin Total: 0.7 mg/dL (ref 0.0–1.2)
CO2: 23 mmol/L (ref 20–29)
Calcium: 10 mg/dL (ref 8.7–10.3)
Chloride: 103 mmol/L (ref 96–106)
Creatinine, Ser: 0.68 mg/dL (ref 0.57–1.00)
Globulin, Total: 2.4 g/dL (ref 1.5–4.5)
Glucose: 86 mg/dL (ref 70–99)
Potassium: 4.8 mmol/L (ref 3.5–5.2)
Sodium: 142 mmol/L (ref 134–144)
Total Protein: 7 g/dL (ref 6.0–8.5)
eGFR: 98 mL/min/{1.73_m2} (ref >59)

## 2023-05-29 LAB — LIPID PANEL
Chol/HDL Ratio: 2.1 ratio (ref 0.0–4.4)
Cholesterol, Total: 108 mg/dL (ref 100–199)
HDL: 51 mg/dL (ref >39)
LDL Chol Calc (NIH): 41 mg/dL (ref 0–99)
Triglycerides: 79 mg/dL (ref 0–149)
VLDL Cholesterol Cal: 16 mg/dL (ref 5–40)

## 2023-05-29 LAB — HEMOGLOBIN A1C
Est. average glucose Bld gHb Est-mCnc: 169 mg/dL
Hgb A1c MFr Bld: 7.5 % — ABNORMAL HIGH (ref 4.8–5.6)

## 2023-05-29 LAB — CBC WITH DIFFERENTIAL/PLATELET
Basophils Absolute: 0 10*3/uL (ref 0.0–0.2)
Basos: 0 %
EOS (ABSOLUTE): 0.2 10*3/uL (ref 0.0–0.4)
Eos: 2 %
Hematocrit: 48.7 % — ABNORMAL HIGH (ref 34.0–46.6)
Hemoglobin: 15.8 g/dL (ref 11.1–15.9)
Immature Grans (Abs): 0 10*3/uL (ref 0.0–0.1)
Immature Granulocytes: 0 %
Lymphocytes Absolute: 2.8 10*3/uL (ref 0.7–3.1)
Lymphs: 28 %
MCH: 27.9 pg (ref 26.6–33.0)
MCHC: 32.4 g/dL (ref 31.5–35.7)
MCV: 86 fL (ref 79–97)
Monocytes Absolute: 0.8 10*3/uL (ref 0.1–0.9)
Monocytes: 8 %
Neutrophils Absolute: 6.3 10*3/uL (ref 1.4–7.0)
Neutrophils: 62 %
Platelets: 303 10*3/uL (ref 150–450)
RBC: 5.66 x10E6/uL — ABNORMAL HIGH (ref 3.77–5.28)
RDW: 13.5 % (ref 11.7–15.4)
WBC: 10.2 10*3/uL (ref 3.4–10.8)

## 2023-05-29 MED ORDER — TRESIBA FLEXTOUCH 200 UNIT/ML ~~LOC~~ SOPN
40.0000 [IU] | PEN_INJECTOR | Freq: Every day | SUBCUTANEOUS | 0 refills | Status: DC
Start: 2023-05-29 — End: 2023-05-31

## 2023-05-29 NOTE — Progress Notes (Signed)
Subjective:   Brandi Velazquez is a 62 y.o. female who presents for Medicare Annual (Subsequent) preventive examination.  Visit Complete: In person  Patient Medicare AWV questionnaire was completed by the patient; I have confirmed that all information answered by patient is correct and no changes since this date.   Cardiac Risk Factors include: diabetes mellitus;dyslipidemiaDiabetes:  Complications:neuropathy Glucose checking: sporadically. Glucose logs: none Hypoglycemia: no symptoms Most recent A1C: 8.5 Current medications: Farxiga 10 mg daily, Tresiba 46 units daily, metformin 1000 mg 1 tablet twice daily, Ozempic 2 mg weekly Foot checks: done.    Hyperlipidemia: Current medications: Pravastatin 40 mg daily  Hypertension: Complications: Current medications: Ramipril 2.5 mg daily, and spironolactone 25 mg daily  COPD: not using inhalers.   Diet: eating healthier, but not exercising other than running after granddaughters.  Review of Systems  Constitutional:  Negative for chills, diaphoresis, fever and malaise/fatigue.  HENT:  Negative for congestion, ear pain and sore throat.   Respiratory:  Negative for cough and sputum production.   Cardiovascular:  Negative for chest pain and palpitations.  Gastrointestinal:  Negative for abdominal pain, constipation, diarrhea, nausea and vomiting.  Genitourinary:  Negative for dysuria and urgency.  Musculoskeletal:  Negative for myalgias.  Neurological:  Negative for dizziness and headaches.  Psychiatric/Behavioral:  Negative for depression. The patient is not nervous/anxious.       Objective:    Today's Vitals   05/29/23 0929 05/29/23 1010  BP: (!) 122/56   Pulse: 80   Resp: 18   Temp: 98.4 F (36.9 C)   Weight: 205 lb (93 kg)   Height: 5\' 1"  (1.549 m)   PainSc:  6    Body mass index is 38.73 kg/m.  Physical Exam Vitals reviewed.  Constitutional:      Appearance: Normal appearance. She is obese.  Neck:      Vascular: No carotid bruit.  Cardiovascular:     Rate and Rhythm: Normal rate and regular rhythm.     Pulses: Normal pulses.     Heart sounds: Normal heart sounds.  Pulmonary:     Effort: Pulmonary effort is normal. No respiratory distress.     Breath sounds: Normal breath sounds.  Abdominal:     General: Abdomen is flat. Bowel sounds are normal.     Palpations: Abdomen is soft.     Tenderness: There is no abdominal tenderness.  Musculoskeletal:     Right lower leg: No edema.     Left lower leg: No edema.  Neurological:     Mental Status: She is alert and oriented to person, place, and time.  Psychiatric:        Mood and Affect: Mood normal.        Behavior: Behavior normal.    Diabetic Foot Exam - Simple   Simple Foot Form  05/29/2023 11:09 PM  Visual Inspection No deformities, no ulcerations, no other skin breakdown bilaterally: Yes Sensation Testing See comments: Yes Pulse Check Posterior Tibialis and Dorsalis pulse intact bilaterally: Yes Comments Some numbness of feet.          05/29/2023   10:08 AM 07/13/2016   11:06 AM  Advanced Directives  Does Patient Have a Medical Advance Directive? No No  Would patient like information on creating a medical advance directive? No - Patient declined No - patient declined information    Current Medications (verified) Outpatient Encounter Medications as of 05/29/2023  Medication Sig   ACCU-CHEK AVIVA PLUS test strip 1 each by Other  route 2 (two) times daily. Use as instructed   ezetimibe (ZETIA) 10 MG tablet Take 1 tablet (10 mg total) by mouth daily.   FARXIGA 10 MG TABS tablet TAKE 1 TABLET EVERY DAY BEFORE BREAKFAST   fluticasone (FLONASE) 50 MCG/ACT nasal spray Place 2 sprays into both nostrils daily.   metFORMIN (GLUCOPHAGE) 1000 MG tablet Take 1 tablet (1,000 mg total) by mouth 2 (two) times daily with a meal.   montelukast (SINGULAIR) 10 MG tablet TAKE 1 TABLET BY MOUTH AT BEDTIME   Multiple Vitamin (MULTIVITAMIN)  tablet Take 1 tablet by mouth daily.   pravastatin (PRAVACHOL) 40 MG tablet TAKE ONE TABLET BY MOUTH EVERYDAY AT BEDTIME   ramipril (ALTACE) 2.5 MG capsule TAKE ONE CAPSULE BY MOUTH EVERY MORNING   Semaglutide, 2 MG/DOSE, (OZEMPIC, 2 MG/DOSE,) 8 MG/3ML SOPN INJECT 2 MG INTO THE SKIN ONCE A WEEK.   spironolactone (ALDACTONE) 25 MG tablet TAKE ONE TABLET BY MOUTH BEFORE BREAKFAST   [DISCONTINUED] TRESIBA FLEXTOUCH 200 UNIT/ML FlexTouch Pen INJECT 30 UNITS INTO THE SKIN DAILY.   No facility-administered encounter medications on file as of 05/29/2023.    Allergies (verified) Bactrim [sulfamethoxazole-trimethoprim] and Morphine and codeine   History: Past Medical History:  Diagnosis Date   Ankle pain    Back pain of lumbar region with sciatica 01/07/2020   Chronic pain    back   COPD (chronic obstructive pulmonary disease) (HCC)    Depression    Diabetes mellitus    GERD (gastroesophageal reflux disease)    Hyperlipidemia    Hypertension    Low back pain    Obstructive sleep apnea    Pneumonia 2010   Post-menopausal atrophic vaginitis    Superficial phlebitis    Past Surgical History:  Procedure Laterality Date   BACK SURGERY  1990   CESAREAN SECTION     CYST REMOVAL HAND     inner thigh   REFRACTIVE SURGERY Right 06/07/2022   SPINE SURGERY     lumbar laminectomy and diskectomy   Family History  Problem Relation Age of Onset   Diabetes Mother    Heart disease Mother    Cancer Father    Diabetes Sister    Stroke Sister    Lung disease Neg Hx    Social History   Socioeconomic History   Marital status: Single    Spouse name: Not on file   Number of children: Not on file   Years of education: Not on file   Highest education level: Not on file  Occupational History   Not on file  Tobacco Use   Smoking status: Former    Current packs/day: 0.00    Average packs/day: 1.5 packs/day for 15.0 years (22.5 ttl pk-yrs)    Types: Cigarettes    Start date: 03/20/1994     Quit date: 03/20/2009    Years since quitting: 14.2   Smokeless tobacco: Never  Vaping Use   Vaping status: Never Used  Substance and Sexual Activity   Alcohol use: No   Drug use: No   Sexual activity: Not Currently    Partners: Male  Other Topics Concern   Not on file  Social History Narrative   Not on file   Social Determinants of Health   Financial Resource Strain: High Risk (08/15/2022)   Overall Financial Resource Strain (CARDIA)    Difficulty of Paying Living Expenses: Hard  Food Insecurity: No Food Insecurity (05/29/2023)   Hunger Vital Sign    Worried About Running Out  of Food in the Last Year: Never true    Ran Out of Food in the Last Year: Never true  Transportation Needs: No Transportation Needs (05/29/2023)   PRAPARE - Administrator, Civil Service (Medical): No    Lack of Transportation (Non-Medical): No  Physical Activity: Inactive (05/29/2023)   Exercise Vital Sign    Days of Exercise per Week: 0 days    Minutes of Exercise per Session: 0 min  Stress: No Stress Concern Present (05/29/2023)   [employer] of Occupational Health - Occupational Stress Questionnaire    Feeling of Stress : Not at all  Social Connections: Moderately Isolated (05/29/2023)   Social Connection and Isolation Panel [NHANES]    Frequency of Communication with Friends and Family: More than three times a week    Frequency of Social Gatherings with Friends and Family: More than three times a week    Attends Religious Services: More than 4 times per year    Active Member of [employer] or Organizations: No    Attends Engineer, structural: Not on file    Marital Status: Divorced    Tobacco Counseling Counseling given: Not Answered   Clinical Intake:  Pre-visit preparation completed: No  Pain : 0-10 Pain Score: 6  Pain Type: Chronic pain Pain Location: Back Pain Orientation: Lower Pain Descriptors / Indicators: Constant, Dull Pain Onset: More than a month ago Pain  Frequency: Constant     Nutritional Status: BMI > 30  Obese Nutritional Risks: None Diabetes: Yes CBG done?: No Did pt. bring in CBG monitor from home?: No  How often do you need to have someone help you when you read instructions, pamphlets, or other written materials from your doctor or pharmacy?: 1 - Never  Interpreter Needed?: No   Activities of Daily Living    05/29/2023   10:09 AM  In your present state of health, do you have any difficulty performing the following activities:  Hearing? 0  Vision? 0  Difficulty concentrating or making decisions? 0  Walking or climbing stairs? 0  Dressing or bathing? 0  Doing errands, shopping? 0  Preparing Food and eating ? N  Using the Toilet? N  In the past six months, have you accidently leaked urine? N  Do you have problems with loss of bowel control? N  Managing your Medications? N  Managing your Finances? N  Housekeeping or managing your Housekeeping? N    Patient Care Team: Blane Ohara, MD as PCP - General (Family Medicine) Zettie Pho, [facility] (Inactive) (Pharmacist)  Indicate any recent Medical Services you may have received from other than [facility] providers in the past year (date may be approximate).    Hearing/Vision screen No results found.   Goals Addressed   None   Depression Screen    05/29/2023    9:32 AM 02/08/2023    9:38 AM 02/05/2022    3:16 PM 07/18/2021   11:21 AM 07/18/2020   10:09 AM 07/13/2016   10:55 AM  PHQ 2/9 Scores  PHQ - 2 Score 0 0 0 0 0 0  PHQ- 9 Score 2 1        Fall Risk    05/29/2023    9:31 AM 02/08/2023    9:37 AM 10/11/2022    9:55 AM 02/05/2022    3:13 PM 07/18/2021   11:20 AM  Fall Risk   Falls in the past year? 1 1 0 0 0  Number falls in past yr: 1  0 0 0 0  Injury with Fall? 1 1 0 0 0  Risk for fall due to : No Fall Risks No Fall Risks No Fall Risks  No Fall Risks  Follow up Falls evaluation completed;Falls prevention discussed Falls evaluation completed;Falls prevention  discussed   Falls evaluation completed    MEDICARE RISK AT HOME:    TIMED UP AND GO:  Was the test performed?  Yes  Length of time to ambulate 10 feet: 5 sec Gait steady and fast without use of assistive device    Cognitive Function:        02/05/2022    3:17 PM  6CIT Screen  What time? 0 points  Count back from 20 0 points  Months in reverse 0 points  Repeat phrase 6 points    Immunizations Immunization History  Administered Date(s) Administered   COVID-19, mRNA, vaccine(Comirnaty)12 years and older 10/11/2022   Influenza Inj Mdck Quad Pf 07/18/2020, 05/18/2021, 06/11/2022   Influenza, Mdck, Trivalent,PF 6+ MOS(egg free) 05/29/2023   Moderna Covid-19 Vaccine Bivalent Booster 27yrs & up 08/22/2021   Moderna SARS-COV2 Booster Vaccination 02/09/2021   Moderna Sars-Covid-2 Vaccination 10/07/2019, 11/02/2019   PPD Test 05/29/2023   Pneumococcal Polysaccharide-23 10/09/2011   Tdap 05/15/2018    Screening Tests Health Maintenance  Topic Date Due   Zoster Vaccines- Shingrix (1 of 2) Never done   COVID-19 Vaccine (5 - 2023-24 season) 05/19/2023   DEXA SCAN  03/24/2025 (Originally 03/10/1961)   Diabetic kidney evaluation - Urine ACR  10/12/2023   HEMOGLOBIN A1C  11/26/2023   FOOT EXAM  02/08/2024   OPHTHALMOLOGY EXAM  04/15/2024   Diabetic kidney evaluation - eGFR measurement  05/28/2024   Medicare Annual Wellness (AWV)  05/31/2024   PAP SMEAR-Modifier  06/06/2024   MAMMOGRAM  07/11/2024   Colonoscopy  01/02/2027   DTaP/Tdap/Td (2 - Td or Tdap) 05/15/2028   INFLUENZA VACCINE  Completed   HPV VACCINES  Aged Out   Lung Cancer Screening  Discontinued   Hepatitis C Screening  Discontinued   HIV Screening  Discontinued    Health Maintenance  Health Maintenance Due  Topic Date Due   Zoster Vaccines- Shingrix (1 of 2) Never done   COVID-19 Vaccine (5 - 2023-24 season) 05/19/2023     Lung Cancer Screening: (Low Dose CT Chest recommended if Age 46-80 years, 20  pack-year currently smoking OR have quit w/in 15years.) does qualify.   Lung Cancer Screening Referral: No, declined  Additional Screening:  Hepatitis C Screening: does not qualify;  Vision Screening: Recommended annual ophthalmology exams for early detection of glaucoma and other disorders of the eye. Is the patient up to date with their annual eye exam?  Yes  Who is the provider or what is the name of the office in which the patient attends annual eye exams? [facility], Dr. Alice Reichert If pt is not established with a provider, would they like to be referred to a provider to establish care? No .   Dental Screening: Recommended annual dental exams for proper oral hygiene  Diabetic Foot Exam:   Community Resource Referral / Chronic Care Management: CRR required this visit?  No   CCM required this visit?  No   Assessment:   This is a routine wellness examination for [place].   Plan:    Encounter for Medicare annual wellness exam Assessment & Plan: Things to do to keep yourself healthy  - Exercise at least 30-45 minutes a day, 3-4 days a week.  -  Eat a low-fat diet with lots of fruits and vegetables, up to 7-9 servings per day.  - Seatbelts can save your life. Wear them always.  - Smoke detectors on every level of your home, check batteries every year.  - Eye Doctor - have an eye exam every 1-2 years  - Safe sex - if you may be exposed to STDs, use a condom.  - Alcohol -  If you drink, do it moderately, less than 2 drinks per day.  - Health Care Power of Attorney. Choose someone to speak for you if you are not able.  - Depression is common in our stressful world.If you're feeling down or losing interest in things you normally enjoy, please come in for a visit.  - Violence - If anyone is threatening or hurting you, please call immediately.    Hypertension associated with diabetes [facility]) Assessment & Plan: Well controlled.  No changes to medicines. Ramipril 2.5 mg daily, and  spironolactone 25 mg daily Continue to work on eating a healthy diet and exercise.  Labs drawn today.    Orders: -     CBC with Differential/Platelet -     Comprehensive metabolic panel  Diabetic polyneuropathy associated with type 2 diabetes mellitus (HCC) Assessment & Plan: Control: good Recommend check sugars fasting daily. Recommend check feet daily. Recommend annual eye exams. Medicines: Farxiga 10 mg daily, Tresiba 30 units daily, metformin 1000 mg 1 tablet twice daily, Ozempic 2 mg weekly Continue to work on eating a healthy diet and exercise.  Labs drawn today.     Orders: -     Hemoglobin A1c  Mixed hyperlipidemia Assessment & Plan: Well controlled.  No changes to medicines. Pravastatin 40 mg daily Continue to work on eating a healthy diet and exercise.  Labs drawn today.    Orders: -     Lipid panel  Tuberculosis screening -     TB Skin Test  Encounter for immunization -     Influenza, MDCK, trivalent, PF(Flucelvax egg-free)  Class 2 severe obesity due to excess calories with serious comorbidity and body mass index (BMI) of 38.0 to 38.9 in adult [facility]) Assessment & Plan: Recommend continue to work on eating healthy diet and exercise. Comorbidities: diabetes, hypertension, hyperlipidemia.    I have personally reviewed and noted the following in the patient's chart:   Medical and social history Use of alcohol, tobacco or illicit drugs  Current medications and supplements including opioid prescriptions. Patient is not currently taking opioid prescriptions. Functional ability and status Nutritional status Physical activity Advanced directives List of other physicians Hospitalizations, surgeries, and ER visits in previous 12 months Vitals Screenings to include cognitive, depression, and falls Referrals and appointments  In addition, I have reviewed and discussed with patient certain preventive protocols, quality metrics, and best practice  recommendations. A written personalized care plan for preventive services as well as general preventive health recommendations were provided to patient.     Blane Ohara, MD   06/02/2023

## 2023-05-29 NOTE — Telephone Encounter (Signed)
Patient needed new rx sent to pharmacy for Tresiba 40 units daily and sent to her mail in pharmacy. Rx corrected and faxed.

## 2023-05-31 ENCOUNTER — Other Ambulatory Visit: Payer: Self-pay

## 2023-05-31 ENCOUNTER — Ambulatory Visit: Payer: Medicare HMO

## 2023-05-31 DIAGNOSIS — E1142 Type 2 diabetes mellitus with diabetic polyneuropathy: Secondary | ICD-10-CM

## 2023-05-31 LAB — TB SKIN TEST
Induration: 0 mm
TB Skin Test: NEGATIVE

## 2023-05-31 MED ORDER — TRESIBA FLEXTOUCH 200 UNIT/ML ~~LOC~~ SOPN
45.0000 [IU] | PEN_INJECTOR | Freq: Every day | SUBCUTANEOUS | 0 refills | Status: AC
Start: 2023-05-31 — End: ?

## 2023-06-01 DIAGNOSIS — Z0000 Encounter for general adult medical examination without abnormal findings: Secondary | ICD-10-CM | POA: Insufficient documentation

## 2023-06-01 NOTE — Assessment & Plan Note (Signed)

## 2023-06-02 DIAGNOSIS — Z111 Encounter for screening for respiratory tuberculosis: Secondary | ICD-10-CM | POA: Insufficient documentation

## 2023-06-02 DIAGNOSIS — Z23 Encounter for immunization: Secondary | ICD-10-CM | POA: Insufficient documentation

## 2023-06-02 NOTE — Assessment & Plan Note (Signed)
Recommend continue to work on eating healthy diet and exercise. Comorbidities: diabetes, hypertension, hyperlipidemia.

## 2023-06-20 ENCOUNTER — Other Ambulatory Visit: Payer: Self-pay

## 2023-06-20 MED ORDER — OMEPRAZOLE 40 MG PO CPDR: 40.0000 mg | DELAYED_RELEASE_CAPSULE | Freq: Every day | ORAL | 1 refills | Status: DC

## 2023-08-09 ENCOUNTER — Other Ambulatory Visit: Payer: Self-pay | Admitting: Family Medicine

## 2023-08-09 DIAGNOSIS — E1142 Type 2 diabetes mellitus with diabetic polyneuropathy: Secondary | ICD-10-CM

## 2023-08-19 ENCOUNTER — Other Ambulatory Visit: Payer: Self-pay

## 2023-08-19 DIAGNOSIS — E1142 Type 2 diabetes mellitus with diabetic polyneuropathy: Secondary | ICD-10-CM

## 2023-08-19 MED ORDER — DAPAGLIFLOZIN PROPANEDIOL 10 MG PO TABS
10.0000 mg | ORAL_TABLET | Freq: Every day | ORAL | 3 refills | Status: DC
Start: 2023-08-19 — End: 2024-04-20

## 2023-08-27 ENCOUNTER — Other Ambulatory Visit: Payer: Self-pay

## 2023-08-27 DIAGNOSIS — E782 Mixed hyperlipidemia: Secondary | ICD-10-CM

## 2023-08-27 MED ORDER — PRAVASTATIN SODIUM 40 MG PO TABS
ORAL_TABLET | ORAL | 1 refills | Status: DC
Start: 2023-08-27 — End: 2024-02-25

## 2023-08-27 MED ORDER — RAMIPRIL 2.5 MG PO CAPS: 2.5000 mg | ORAL_CAPSULE | Freq: Every morning | ORAL | 1 refills | Status: DC

## 2023-08-27 MED ORDER — SPIRONOLACTONE 25 MG PO TABS: 25.0000 mg | ORAL_TABLET | Freq: Every day | ORAL | 1 refills | Status: DC

## 2023-08-28 ENCOUNTER — Telehealth: Payer: Self-pay

## 2023-08-28 NOTE — Telephone Encounter (Signed)
PAP: PAP application for [place], Ozempic, and Trulicity, Publishing rights manager ([employer]) and [employer]) has been mailed to [employer] address on file. Will fax provider portion of application to provider's office when pt's portion is received.   Please note I have faxed prescriber portion to provider

## 2023-08-29 ENCOUNTER — Other Ambulatory Visit: Payer: Self-pay

## 2023-08-29 ENCOUNTER — Telehealth: Payer: Self-pay

## 2023-08-29 ENCOUNTER — Other Ambulatory Visit: Payer: Self-pay | Admitting: Family Medicine

## 2023-08-29 MED ORDER — METFORMIN HCL 1000 MG PO TABS: 1000.0000 mg | ORAL_TABLET | Freq: Two times a day (BID) | ORAL | 1 refills | Status: DC

## 2023-08-29 MED ORDER — EZETIMIBE 10 MG PO TABS: 10.0000 mg | ORAL_TABLET | Freq: Every day | ORAL | 0 refills | Status: DC

## 2023-08-29 NOTE — Telephone Encounter (Signed)
Copied from CRM [id number]. Topic: Clinical - Medication Refill >> Aug 29, 2023  3:23 PM Hector Shade B wrote: Reason for CRM: Patient called in regards to her Metformin, patient stated the pharmacy has informed her several request had been sent for refills but never a response, I called the CAL to assist the patient with medication refills so I could explain to her properly. Patient was unsure if she was still using old pharmacy or new pharmacy clarified that as well and prescription was been sent over to preferred pharmacy.   ezetimibe (ZETIA) 10 MG tablet patient stated she had also submit a refill request for this script as well.

## 2023-09-02 NOTE — Progress Notes (Signed)
Subjective:  Patient ID: Brandi Velazquez, female    DOB: November 21, 1960  Age: 62 y.o. MRN: [id number]  Chief Complaint  Patient presents with   Medical Management of Chronic Issues    HPI The patient, a 62 year old with a history of diabetes, hypertension, hyperlipidemia, and previous carpal tunnel surgery, presents with new onset urinary urgency and tingling in the fingers. The urinary urgency is described as a sudden need to urinate with no prior sensation of needing to go, and she denies dysuria, hematuria, and fever. The tingling sensation is localized to the first three fingers of both hands, more pronounced on the right, and has been present for about a month. The patient denies any exacerbating or relieving factors and reports that the sensation is constant. She also reports that these symptoms started around the same time.  The patient's diabetes is managed with Beaulah Corin, metformin, and Ozempic, but she admits to not checking her blood sugars due to a broken glucose monitor. She expresses interest in a continuous glucose monitor. She also takes pravastatin and zetia for hyperlipidemia, ramipril and spironolactone for hypertension, and Singulair for allergies. She reports occasional use of omeprazole for acid reflux.  Diabetes:  Complications:neuropathy Glucose checking: no Glucose logs: nausea/a Hypoglycemia: no symptoms Most recent A1C: 7.5 Current medications: Farxiga 10 mg daily, Tresiba 46 units daily, metformin 1000 mg 1 tablet twice daily, Ozempic 2 mg weekly Last Eye Exam:01/2023.  Had recent cataract surgery and stents for glaucoma.  Eyes doing well  Foot checks: done.    Hyperlipidemia: Current medications: Pravastatin 40 mg daily, zetia 10 mg before bed.  Hypertension: Complications: Current medications: Ramipril 2.5 mg daily, and spironolactone 25 mg daily  COPD: not using inhalers.   Diet: eating healthy  Exercise: Running after her grandkids      05/29/2023    9:32 AM 02/08/2023    9:38 AM 02/05/2022    3:16 PM 07/18/2021   11:21 AM 07/18/2020   10:09 AM  Depression screen PHQ 2/9  Decreased Interest 0 0 0 0 0  Down, Depressed, Hopeless 0 0 0 0 0  PHQ - 2 Score 0 0 0 0 0  Altered sleeping 0 0     Tired, decreased energy 1 0     Change in appetite 1 1     Feeling bad or failure about yourself  0 0     Trouble concentrating 0 0     Moving slowly or fidgety/restless 0 0     Suicidal thoughts 0 0     PHQ-9 Score 2 1     Difficult doing work/chores Not difficult at all Not difficult at all           05/29/2023    9:31 AM  Fall Risk   Falls in the past year? 1  Number falls in past yr: 1  Injury with Fall? 1  Risk for fall due to : No Fall Risks  Follow up Falls evaluation completed;Falls prevention discussed    Patient Care Team: Blane Ohara, MD as PCP - General (Family Medicine) Zettie Pho, [facility] (Inactive) (Pharmacist)   Review of Systems  Constitutional:  Negative for chills, fatigue and fever.  HENT:  Negative for congestion, ear pain, rhinorrhea and sore throat.   Respiratory:  Negative for cough and shortness of breath.   Cardiovascular:  Negative for chest pain.  Gastrointestinal:  Negative for abdominal pain, constipation, diarrhea, nausea and vomiting.  Genitourinary:  Positive for urgency. Negative for  dysuria.  Musculoskeletal:  Positive for back pain. Negative for myalgias.  Neurological:  Negative for dizziness, weakness, light-headedness and headaches.  Psychiatric/Behavioral:  Negative for dysphoric mood. The patient is not nervous/anxious.     Current Outpatient Medications on File Prior to Visit  Medication Sig Dispense Refill   ACCU-CHEK AVIVA PLUS test strip 1 each by Other route 2 (two) times daily. Use as instructed 100 each 2   dapagliflozin propanediol (FARXIGA) 10 MG TABS tablet Take 1 tablet (10 mg total) by mouth daily. 90 tablet 3   ezetimibe (ZETIA) 10 MG tablet Take 1 tablet (10 mg  total) by mouth daily. 90 tablet 0   fluticasone (FLONASE) 50 MCG/ACT nasal spray Place 2 sprays into both nostrils daily. 48 g 3   insulin degludec (TRESIBA FLEXTOUCH) 200 UNIT/ML FlexTouch Pen Inject 46 Units into the skin daily. 108 mL 0   metFORMIN (GLUCOPHAGE) 1000 MG tablet Take 1 tablet (1,000 mg total) by mouth 2 (two) times daily with a meal. 180 tablet 1   montelukast (SINGULAIR) 10 MG tablet TAKE 1 TABLET BY MOUTH AT BEDTIME 90 tablet 3   Multiple Vitamin (MULTIVITAMIN) tablet Take 1 tablet by mouth daily.     omeprazole (PRILOSEC) 40 MG capsule Take 1 capsule (40 mg total) by mouth daily. 90 capsule 1   pravastatin (PRAVACHOL) 40 MG tablet TAKE ONE TABLET BY MOUTH EVERYDAY AT BEDTIME 90 tablet 1   ramipril (ALTACE) 2.5 MG capsule Take 1 capsule (2.5 mg total) by mouth every morning. 90 capsule 1   Semaglutide, 2 MG/DOSE, (OZEMPIC, 2 MG/DOSE,) 8 MG/3ML SOPN INJECT 2 MG INTO THE SKIN ONCE A WEEK. 3 mL 5   spironolactone (ALDACTONE) 25 MG tablet Take 1 tablet (25 mg total) by mouth daily. 90 tablet 1   No current facility-administered medications on file prior to visit.   Past Medical History:  Diagnosis Date   Ankle pain    Back pain of lumbar region with sciatica 01/07/2020   Chronic pain    back   COPD (chronic obstructive pulmonary disease) (HCC)    Depression    Diabetes mellitus    GERD (gastroesophageal reflux disease)    Hyperlipidemia    Hypertension    Low back pain    Obstructive sleep apnea    Pneumonia 2010   Post-menopausal atrophic vaginitis    Superficial phlebitis    Past Surgical History:  Procedure Laterality Date   BACK SURGERY  1990   CESAREAN SECTION     CYST REMOVAL HAND     inner thigh   REFRACTIVE SURGERY Right 06/07/2022   SPINE SURGERY     lumbar laminectomy and diskectomy    Family History  Problem Relation Age of Onset   Diabetes Mother    Heart disease Mother    Cancer Father    Diabetes Sister    Stroke Sister    Lung disease  Neg Hx    Social History   Socioeconomic History   Marital status: Single    Spouse name: Not on file   Number of children: Not on file   Years of education: Not on file   Highest education level: Not on file  Occupational History   Not on file  Tobacco Use   Smoking status: Former    Current packs/day: 0.00    Average packs/day: 1.5 packs/day for 15.0 years (22.5 ttl pk-yrs)    Types: Cigarettes    Start date: 03/20/1994    Quit date: 03/20/2009  Years since quitting: 14.4   Smokeless tobacco: Never  Vaping Use   Vaping status: Never Used  Substance and Sexual Activity   Alcohol use: No   Drug use: No   Sexual activity: Not Currently    Partners: Male  Other Topics Concern   Not on file  Social History Narrative   Not on file   Social Drivers of Health   Financial Resource Strain: High Risk (08/15/2022)   Overall Financial Resource Strain (CARDIA)    Difficulty of Paying Living Expenses: Hard  Food Insecurity: No Food Insecurity (05/29/2023)   Hunger Vital Sign    Worried About Running Out of Food in the Last Year: Never true    Ran Out of Food in the Last Year: Never true  Transportation Needs: No Transportation Needs (05/29/2023)   PRAPARE - Administrator, Civil Service (Medical): No    Lack of Transportation (Non-Medical): No  Physical Activity: Inactive (05/29/2023)   Exercise Vital Sign    Days of Exercise per Week: 0 days    Minutes of Exercise per Session: 0 min  Stress: No Stress Concern Present (05/29/2023)   [employer] of Occupational Health - Occupational Stress Questionnaire    Feeling of Stress : Not at all  Social Connections: Moderately Isolated (05/29/2023)   Social Connection and Isolation Panel [NHANES]    Frequency of Communication with Friends and Family: More than three times a week    Frequency of Social Gatherings with Friends and Family: More than three times a week    Attends Religious Services: More than 4 times per year     Active Member of [employer] or Organizations: No    Attends Engineer, structural: Not on file    Marital Status: Divorced    Objective:  BP 124/76   Pulse 73   Temp 97.8 F (36.6 C)   Ht 5\' 1"  (1.549 m)   Wt 213 lb (96.6 kg)   SpO2 97%   BMI 40.25 kg/m      09/03/2023    9:45 AM 05/29/2023    9:29 AM 03/20/2023    4:01 PM  BP/Weight  Systolic BP 124 122 114  Diastolic BP 76 56 73  Wt. (Lbs) 213 205 219  BMI 40.25 kg/m2 38.73 kg/m2 37.59 kg/m2    Physical Exam Vitals reviewed.  Constitutional:      Appearance: Normal appearance. She is normal weight.  Neck:     Vascular: No carotid bruit.  Cardiovascular:     Rate and Rhythm: Normal rate and regular rhythm.     Heart sounds: Normal heart sounds.  Pulmonary:     Effort: Pulmonary effort is normal. No respiratory distress.     Breath sounds: Normal breath sounds.  Abdominal:     General: Abdomen is flat. Bowel sounds are normal.     Palpations: Abdomen is soft.     Tenderness: There is no abdominal tenderness.  Neurological:     Mental Status: She is alert and oriented to person, place, and time.     Comments: Negative tinels and phalens.  Psychiatric:        Mood and Affect: Mood normal.        Behavior: Behavior normal.     Diabetic Foot Exam - Simple   Simple Foot Form Diabetic Foot exam was performed with the following findings: Yes 09/03/2023 10:03 AM  Visual Inspection No deformities, no ulcerations, no other skin breakdown bilaterally: Yes Sensation Testing See  comments: Yes Pulse Check Posterior Tibialis and Dorsalis pulse intact bilaterally: Yes Comments      Lab Results  Component Value Date   WBC 10.2 05/29/2023   HGB 15.8 05/29/2023   HCT 48.7 (H) 05/29/2023   PLT 303 05/29/2023   GLUCOSE 86 05/29/2023   CHOL 108 05/29/2023   TRIG 79 05/29/2023   HDL 51 05/29/2023   LDLCALC 41 05/29/2023   ALT 21 05/29/2023   AST 19 05/29/2023   NA 142 05/29/2023   K 4.8 05/29/2023   CL  103 05/29/2023   CREATININE 0.68 05/29/2023   BUN 15 05/29/2023   CO2 23 05/29/2023   TSH 1.500 11/22/2021   HGBA1C 7.5 (H) 05/29/2023   MICROALBUR 30 05/18/2021      Assessment & Plan:    Diabetic polyneuropathy associated with type 2 diabetes mellitus (HCC) Assessment & Plan: Control: good Recommend check sugars fasting daily. Recommend check feet daily. Recommend annual eye exams. Medicines: Farxiga 10 mg daily, Tresiba 30 units daily, metformin 1000 mg 1 tablet twice daily, Ozempic 2 mg weekly Continue to work on eating a healthy diet and exercise.  Labs drawn today.    Check your blood glucose and vitamin levels and consider a continuous glucose monitor to better manage your diabetes.  Orders: -     Hemoglobin A1c  Hypertension associated with diabetes (HCC) Assessment & Plan: Well controlled.  No changes to medicines. Ramipril 2.5 mg daily, and spironolactone 25 mg daily Continue to work on eating a healthy diet and exercise.  Labs drawn today.    Orders: -     CBC with Differential/Platelet -     Comprehensive metabolic panel  Mixed hyperlipidemia Assessment & Plan: Well controlled.  No changes to medicines. Pravastatin 40 mg daily and zetia 10 mg daily. Continue to work on eating a healthy diet and exercise.  Labs drawn today.    Orders: -     Lipid panel  Urinary frequency Assessment & Plan: Check UA  Orders: -     POCT URINALYSIS DIP (CLINITEK)  Encounter for immunization -     Pfizer Comirnaty Covid-19 Vaccine 73yrs & older  Neuropathy Assessment & Plan: Check labs Consider a continuous glucose monitor to better manage your diabetes.  Orders: -     B12 and Folate Panel -     Methylmalonic acid, serum -     TSH     No orders of the defined types were placed in this encounter.   Orders Placed This Encounter  Procedures   Pfizer Comirnaty Covid-19 Vaccine 30yrs & older   CBC with Differential/Platelet   Comprehensive metabolic  panel   Hemoglobin A1c   Lipid panel   B12 and Folate Panel   Methylmalonic acid, serum   TSH   POCT URINALYSIS DIP (CLINITEK)     Follow-up: Return in about 3 months (around 12/02/2023) for chronic follow up.   I,Marla I Leal-Borjas,acting as a scribe for Blane Ohara, MD.,have documented all relevant documentation on the behalf of Blane Ohara, MD,as directed by  Blane Ohara, MD while in the presence of Blane Ohara, MD.   An After Visit Summary was printed and given to the patient.  Blane Ohara, MD [facility] [phone]

## 2023-09-03 ENCOUNTER — Ambulatory Visit (INDEPENDENT_AMBULATORY_CARE_PROVIDER_SITE_OTHER): Payer: Medicare HMO | Admitting: Family Medicine

## 2023-09-03 ENCOUNTER — Encounter: Payer: Self-pay | Admitting: Family Medicine

## 2023-09-03 VITALS — BP 124/76 | HR 73 | Temp 97.8°F | Ht 61.0 in | Wt 213.0 lb

## 2023-09-03 DIAGNOSIS — J449 Chronic obstructive pulmonary disease, unspecified: Secondary | ICD-10-CM | POA: Diagnosis not present

## 2023-09-03 DIAGNOSIS — E782 Mixed hyperlipidemia: Secondary | ICD-10-CM

## 2023-09-03 DIAGNOSIS — Z23 Encounter for immunization: Secondary | ICD-10-CM

## 2023-09-03 DIAGNOSIS — R350 Frequency of micturition: Secondary | ICD-10-CM | POA: Diagnosis not present

## 2023-09-03 DIAGNOSIS — I152 Hypertension secondary to endocrine disorders: Secondary | ICD-10-CM

## 2023-09-03 DIAGNOSIS — E1159 Type 2 diabetes mellitus with other circulatory complications: Secondary | ICD-10-CM | POA: Diagnosis not present

## 2023-09-03 DIAGNOSIS — G629 Polyneuropathy, unspecified: Secondary | ICD-10-CM

## 2023-09-03 DIAGNOSIS — E1142 Type 2 diabetes mellitus with diabetic polyneuropathy: Secondary | ICD-10-CM

## 2023-09-03 LAB — POCT URINALYSIS DIP (CLINITEK)
Bilirubin, UA: NEGATIVE
Blood, UA: NEGATIVE
Glucose, UA: 250 mg/dL — AB
Ketones, POC UA: NEGATIVE mg/dL
Leukocytes, UA: NEGATIVE
Nitrite, UA: NEGATIVE
POC PROTEIN,UA: NEGATIVE
Spec Grav, UA: >=1.03 — AB (ref 1.010–1.025)
Urobilinogen, UA: NEGATIVE U/dL
pH, UA: 5.5 (ref 5.0–8.0)

## 2023-09-03 NOTE — Patient Instructions (Signed)
VISIT SUMMARY:  Today, we discussed your new symptoms of urinary urgency and tingling in your fingers, as well as your ongoing management of diabetes, hypertension, hyperlipidemia, and GERD. We also reviewed your current medications and discussed vaccinations.  YOUR PLAN:  -URINARY URGENCY: Urinary urgency is a sudden, strong need to urinate. We will collect a urine sample to rule out any infection.  -PERIPHERAL NEUROPATHY: Peripheral neuropathy is a condition that results in tingling or numbness in the extremities, often due to diabetes or vitamin deficiencies. We will check your blood glucose and vitamin levels and consider a continuous glucose monitor to better manage your diabetes.  -DIABETES MELLITUS: Diabetes mellitus is a condition where your blood sugar levels are too high. We will try to obtain a continuous glucose monitor for you to help manage your diabetes more effectively.  -VACCINATIONS: We discussed the importance of receiving the COVID and shingles vaccines. You will be referred to a pharmacy for the shingles vaccine, which is covered by Medicare.  -HYPERLIPIDEMIA: Hyperlipidemia is a condition where there are high levels of fats in the blood. Continue taking your current medications, pravastatin and Zetia, to manage your cholesterol levels.  -HYPERTENSION: Hypertension is high blood pressure. Continue taking your current medications, ramipril and spironolactone, to manage your blood pressure.  -GERD: GERD is gastroesophageal reflux disease, which causes acid reflux. Continue using omeprazole as needed for your symptoms.  INSTRUCTIONS:  Please follow up with the urine sample collection for urinalysis. We will also arrange for blood tests to check your glucose and vitamin levels. Additionally, we will consider getting you a continuous glucose monitor. Visit the pharmacy for your shingles vaccine as discussed.

## 2023-09-08 DIAGNOSIS — G629 Polyneuropathy, unspecified: Secondary | ICD-10-CM | POA: Insufficient documentation

## 2023-09-08 DIAGNOSIS — R350 Frequency of micturition: Secondary | ICD-10-CM | POA: Insufficient documentation

## 2023-09-08 NOTE — Assessment & Plan Note (Addendum)
Well controlled.  No changes to medicines. Pravastatin 40 mg daily and zetia 10 mg daily. Continue to work on eating a healthy diet and exercise.  Labs drawn today.

## 2023-09-08 NOTE — Assessment & Plan Note (Signed)
Check UA 

## 2023-09-08 NOTE — Assessment & Plan Note (Signed)
Check labs Consider a continuous glucose monitor to better manage your diabetes.

## 2023-09-08 NOTE — Assessment & Plan Note (Signed)
Control: good Recommend check sugars fasting daily. Recommend check feet daily. Recommend annual eye exams. Medicines: Farxiga 10 mg daily, Tresiba 30 units daily, metformin 1000 mg 1 tablet twice daily, Ozempic 2 mg weekly Continue to work on eating a healthy diet and exercise.  Labs drawn today.    Check your blood glucose and vitamin levels and consider a continuous glucose monitor to better manage your diabetes.

## 2023-09-08 NOTE — Assessment & Plan Note (Signed)
Well controlled.  No changes to medicines. Ramipril 2.5 mg daily, and spironolactone 25 mg daily Continue to work on eating a healthy diet and exercise.  Labs drawn today.

## 2023-09-12 ENCOUNTER — Telehealth: Payer: Self-pay

## 2023-09-12 ENCOUNTER — Telehealth: Payer: Medicare HMO | Admitting: Physician Assistant

## 2023-09-12 DIAGNOSIS — J019 Acute sinusitis, unspecified: Secondary | ICD-10-CM

## 2023-09-12 DIAGNOSIS — B9689 Other specified bacterial agents as the cause of diseases classified elsewhere: Secondary | ICD-10-CM

## 2023-09-12 LAB — COMPREHENSIVE METABOLIC PANEL
ALT: 23 [IU]/L (ref 0–32)
AST: 16 [IU]/L (ref 0–40)
Albumin: 4.6 g/dL (ref 3.9–4.9)
Alkaline Phosphatase: 67 [IU]/L (ref 44–121)
BUN/Creatinine Ratio: 31 — ABNORMAL HIGH (ref 12–28)
BUN: 21 mg/dL (ref 8–27)
Bilirubin Total: 0.5 mg/dL (ref 0.0–1.2)
CO2: 22 mmol/L (ref 20–29)
Calcium: 9.8 mg/dL (ref 8.7–10.3)
Chloride: 103 mmol/L (ref 96–106)
Creatinine, Ser: 0.68 mg/dL (ref 0.57–1.00)
Globulin, Total: 2.6 g/dL (ref 1.5–4.5)
Glucose: 79 mg/dL (ref 70–99)
Potassium: 4 mmol/L (ref 3.5–5.2)
Sodium: 144 mmol/L (ref 134–144)
Total Protein: 7.2 g/dL (ref 6.0–8.5)
eGFR: 98 mL/min/{1.73_m2} (ref >59)

## 2023-09-12 LAB — LIPID PANEL
Chol/HDL Ratio: 2.5 {ratio} (ref 0.0–4.4)
Cholesterol, Total: 143 mg/dL (ref 100–199)
HDL: 58 mg/dL (ref >39)
LDL Chol Calc (NIH): 69 mg/dL (ref 0–99)
Triglycerides: 85 mg/dL (ref 0–149)
VLDL Cholesterol Cal: 16 mg/dL (ref 5–40)

## 2023-09-12 LAB — CBC WITH DIFFERENTIAL/PLATELET
Basophils Absolute: 0 10*3/uL (ref 0.0–0.2)
Basos: 0 %
EOS (ABSOLUTE): 0.3 10*3/uL (ref 0.0–0.4)
Eos: 2 %
Hematocrit: 47.2 % — ABNORMAL HIGH (ref 34.0–46.6)
Hemoglobin: 15.2 g/dL (ref 11.1–15.9)
Immature Grans (Abs): 0 10*3/uL (ref 0.0–0.1)
Immature Granulocytes: 0 %
Lymphocytes Absolute: 3.6 10*3/uL — ABNORMAL HIGH (ref 0.7–3.1)
Lymphs: 31 %
MCH: 28.6 pg (ref 26.6–33.0)
MCHC: 32.2 g/dL (ref 31.5–35.7)
MCV: 89 fL (ref 79–97)
Monocytes Absolute: 0.8 10*3/uL (ref 0.1–0.9)
Monocytes: 7 %
Neutrophils Absolute: 6.9 10*3/uL (ref 1.4–7.0)
Neutrophils: 60 %
Platelets: 289 10*3/uL (ref 150–450)
RBC: 5.32 x10E6/uL — ABNORMAL HIGH (ref 3.77–5.28)
RDW: 13.1 % (ref 11.7–15.4)
WBC: 11.7 10*3/uL — ABNORMAL HIGH (ref 3.4–10.8)

## 2023-09-12 LAB — B12 AND FOLATE PANEL
Folate: >20 ng/mL (ref >3.0)
Vitamin B-12: 1400 pg/mL — ABNORMAL HIGH (ref 232–1245)

## 2023-09-12 LAB — HEMOGLOBIN A1C
Est. average glucose Bld gHb Est-mCnc: 163 mg/dL
Hgb A1c MFr Bld: 7.3 % — ABNORMAL HIGH (ref 4.8–5.6)

## 2023-09-12 LAB — METHYLMALONIC ACID, SERUM: Methylmalonic Acid: 106 nmol/L (ref 0–378)

## 2023-09-12 LAB — TSH: TSH: 1.86 u[IU]/mL (ref 0.450–4.500)

## 2023-09-12 MED ORDER — DOXYCYCLINE HYCLATE 100 MG PO TABS
100.0000 mg | ORAL_TABLET | Freq: Two times a day (BID) | ORAL | 0 refills | Status: DC
Start: 2023-09-12 — End: 2023-10-02

## 2023-09-12 NOTE — Telephone Encounter (Signed)
Pt called today to request a same day appointment for the following symptoms:cough, wheezing. Unfortunately, our schedule is full and we have no openings between today or tomorrow. I have sent the MyChart link to the patient. Patient stated that she will try to sign up for mychart to do an e-visit with a [facility] Provider from home.

## 2023-09-12 NOTE — Progress Notes (Signed)

## 2023-09-16 ENCOUNTER — Other Ambulatory Visit: Payer: Self-pay | Admitting: Family Medicine

## 2023-09-16 DIAGNOSIS — E1142 Type 2 diabetes mellitus with diabetic polyneuropathy: Secondary | ICD-10-CM

## 2023-10-02 ENCOUNTER — Encounter: Payer: Self-pay | Admitting: Family Medicine

## 2023-10-02 ENCOUNTER — Telehealth: Payer: Self-pay | Admitting: Family Medicine

## 2023-10-02 ENCOUNTER — Ambulatory Visit: Payer: 59 | Admitting: Physician Assistant

## 2023-10-02 ENCOUNTER — Encounter: Payer: Self-pay | Admitting: Physician Assistant

## 2023-10-02 ENCOUNTER — Other Ambulatory Visit: Payer: Self-pay | Admitting: Family Medicine

## 2023-10-02 VITALS — BP 132/62 | HR 137 | Temp 97.8°F | Resp 16 | Ht 61.0 in | Wt 204.4 lb

## 2023-10-02 DIAGNOSIS — J449 Chronic obstructive pulmonary disease, unspecified: Secondary | ICD-10-CM | POA: Diagnosis not present

## 2023-10-02 DIAGNOSIS — J0100 Acute maxillary sinusitis, unspecified: Secondary | ICD-10-CM

## 2023-10-02 DIAGNOSIS — R6889 Other general symptoms and signs: Secondary | ICD-10-CM | POA: Diagnosis not present

## 2023-10-02 DIAGNOSIS — Z1152 Encounter for screening for COVID-19: Secondary | ICD-10-CM | POA: Diagnosis not present

## 2023-10-02 DIAGNOSIS — J329 Chronic sinusitis, unspecified: Secondary | ICD-10-CM | POA: Insufficient documentation

## 2023-10-02 DIAGNOSIS — E1142 Type 2 diabetes mellitus with diabetic polyneuropathy: Secondary | ICD-10-CM

## 2023-10-02 LAB — POCT INFLUENZA A/B
Influenza A, POC: POSITIVE — AB
Influenza B, POC: NEGATIVE

## 2023-10-02 LAB — POC COVID19 BINAXNOW: SARS Coronavirus 2 Ag: NEGATIVE

## 2023-10-02 MED ORDER — TRIAMCINOLONE ACETONIDE 40 MG/ML IJ SUSP
40.0000 mg | Freq: Once | INTRAMUSCULAR | Status: AC
Start: 2023-10-02 — End: 2023-10-02
  Administered 2023-10-02: 40 mg via INTRAMUSCULAR

## 2023-10-02 MED ORDER — ONDANSETRON 4 MG PO TBDP
4.0000 mg | ORAL_TABLET | Freq: Three times a day (TID) | ORAL | 0 refills | Status: DC | PRN
Start: 2023-10-02 — End: 2024-03-30

## 2023-10-02 MED ORDER — AZITHROMYCIN 250 MG PO TABS: ORAL_TABLET | ORAL | 0 refills | Status: AC

## 2023-10-02 MED ORDER — BREZTRI AEROSPHERE 160-9-4.8 MCG/ACT IN AERO
2.0000 | INHALATION_SPRAY | Freq: Two times a day (BID) | RESPIRATORY_TRACT | 2 refills | Status: DC
Start: 2023-10-02 — End: 2024-04-20

## 2023-10-02 NOTE — Assessment & Plan Note (Signed)
 Persistent cough causing sleep disturbance. Recent use of Breztri  inhaler providing temporary relief. -Increase Breztri  to two puffs in the morning and two puffs at night. -Order chest x-ray to assess for possible lung involvement.

## 2023-10-02 NOTE — Telephone Encounter (Signed)
 Copied from CRM [id number]. Topic: Clinical - Medication Question >> Oct 01, 2023  8:16 AM Brandi Velazquez J wrote: Reason for CRM: Pt states the antibiotics she was prescribed are finished but she is still not better. She wants to know if she can be prescribed a Ztac or if she needs to come back in the office pt is requesting a callback  [phone]

## 2023-10-02 NOTE — Telephone Encounter (Signed)
 Sent zpack. Dr. Reinhold Carbine

## 2023-10-02 NOTE — Progress Notes (Signed)
 Acute Office Visit  Subjective:    Patient ID: Brandi Velazquez, female    DOB: [birth year], 64 y.o.   MRN: [id number]  Chief Complaint  Patient presents with   Emesis   Diarrhea   Fever   Cough   Headache   Patient states she has had symptoms for more than two weeks. Patient states she was prescribed doxycycline  through online services, for ten days but it has not helped.    Discussed the use of AI scribe software for clinical note transcription with the patient, who gave verbal consent to proceed.  HPI: Patient is in today for chronic congestion, nausea vomiting. Discussed the use of AI scribe software for clinical note transcription with the patient, who gave verbal consent to proceed.  History of Present Illness   A 63 year old female with a history of diabetes presents with chronic sinusitis, which has been unresponsive to previous treatment. She reports a slight improvement but not significant. Recently, she has developed additional symptoms including nausea, vomiting, fever, and coughing. She has been unable to eat for the past three days and has difficulty sleeping due to the coughing. She also reports shortness of breath, which is somewhat relieved by a breathing treatment. She tested positive for the flu. The patient also mentions that she has been experiencing diarrhea and has been unable to keep down any food or water, resorting to sipping on ice chips. She has been unable to check her blood sugar levels due to her illness. She also reports a constant cough, which is particularly severe at night, and has been using a Breztri  inhaler to manage it.       Past Medical History:  Diagnosis Date   Ankle pain    Back pain of lumbar region with sciatica 01/07/2020   Chronic pain    back   COPD (chronic obstructive pulmonary disease) (HCC)    Depression    Diabetes mellitus    GERD (gastroesophageal reflux disease)    Hyperlipidemia    Hypertension    Low back pain     Obstructive sleep apnea    Pneumonia 2010   Post-menopausal atrophic vaginitis    Superficial phlebitis     Past Surgical History:  Procedure Laterality Date   BACK SURGERY  1990   CESAREAN SECTION     CYST REMOVAL HAND     inner thigh   REFRACTIVE SURGERY Right 06/07/2022   SPINE SURGERY     lumbar laminectomy and diskectomy    Family History  Problem Relation Age of Onset   Diabetes Mother    Heart disease Mother    Cancer Father    Diabetes Sister    Stroke Sister    Lung disease Neg Hx     Social History   Socioeconomic History   Marital status: Single    Spouse name: Not on file   Number of children: Not on file   Years of education: Not on file   Highest education level: Not on file  Occupational History   Not on file  Tobacco Use   Smoking status: Former    Current packs/day: 0.00    Average packs/day: 1.5 packs/day for 15.0 years (22.5 ttl pk-yrs)    Types: Cigarettes    Start date: 03/20/1994    Quit date: 03/20/2009    Years since quitting: 14.5   Smokeless tobacco: Never  Vaping Use   Vaping status: Never Used  Substance and Sexual Activity   Alcohol use: No  Drug use: No   Sexual activity: Not Currently    Partners: Male  Other Topics Concern   Not on file  Social History Narrative   Not on file   Social Drivers of Health   Financial Resource Strain: High Risk (08/15/2022)   Overall Financial Resource Strain (CARDIA)    Difficulty of Paying Living Expenses: Hard  Food Insecurity: No Food Insecurity (05/29/2023)   Hunger Vital Sign    Worried About Running Out of Food in the Last Year: Never true    Ran Out of Food in the Last Year: Never true  Transportation Needs: No Transportation Needs (05/29/2023)   PRAPARE - Administrator, Civil Service (Medical): No    Lack of Transportation (Non-Medical): No  Physical Activity: Inactive (05/29/2023)   Exercise Vital Sign    Days of Exercise per Week: 0 days    Minutes of Exercise per  Session: 0 min  Stress: No Stress Concern Present (05/29/2023)   [employer] of Occupational Health - Occupational Stress Questionnaire    Feeling of Stress : Not at all  Social Connections: Moderately Isolated (05/29/2023)   Social Connection and Isolation Panel [NHANES]    Frequency of Communication with Friends and Family: More than three times a week    Frequency of Social Gatherings with Friends and Family: More than three times a week    Attends Religious Services: More than 4 times per year    Active Member of [employer] or Organizations: No    Attends Banker Meetings: Not on file    Marital Status: Divorced  Intimate Partner Violence: Not At Risk (05/29/2023)   Humiliation, Afraid, Rape, and Kick questionnaire    Fear of Current or Ex-Partner: No    Emotionally Abused: No    Physically Abused: No    Sexually Abused: No    Outpatient Medications Prior to Visit  Medication Sig Dispense Refill   ACCU-CHEK AVIVA PLUS test strip 1 each by Other route 2 (two) times daily. Use as instructed 100 each 2   dapagliflozin  propanediol (FARXIGA ) 10 MG TABS tablet Take 1 tablet (10 mg total) by mouth daily. 90 tablet 3   ezetimibe  (ZETIA ) 10 MG tablet Take 1 tablet (10 mg total) by mouth daily. 90 tablet 0   fluticasone  (FLONASE ) 50 MCG/ACT nasal spray Place 2 sprays into both nostrils daily. 48 g 3   insulin degludec  (TRESIBA  FLEXTOUCH) 200 UNIT/ML FlexTouch Pen Inject 46 Units into the skin daily. 108 mL 0   metFORMIN  (GLUCOPHAGE ) 1000 MG tablet Take 1 tablet (1,000 mg total) by mouth 2 (two) times daily with a meal. 180 tablet 1   montelukast  (SINGULAIR ) 10 MG tablet TAKE 1 TABLET BY MOUTH AT BEDTIME 90 tablet 3   Multiple Vitamin (MULTIVITAMIN) tablet Take 1 tablet by mouth daily.     omeprazole  (PRILOSEC) 40 MG capsule Take 1 capsule (40 mg total) by mouth daily. 90 capsule 1   pravastatin  (PRAVACHOL ) 40 MG tablet TAKE ONE TABLET BY MOUTH EVERYDAY AT BEDTIME 90 tablet 1    ramipril  (ALTACE ) 2.5 MG capsule Take 1 capsule (2.5 mg total) by mouth every morning. 90 capsule 1   Semaglutide , 2 MG/DOSE, (OZEMPIC , 2 MG/DOSE,) 8 MG/3ML SOPN INJECT 2MG  UNDER THE SKIN ONE TIME WEEKLY 9 mL 1   spironolactone  (ALDACTONE ) 25 MG tablet Take 1 tablet (25 mg total) by mouth daily. 90 tablet 1   doxycycline  (VIBRA -TABS) 100 MG tablet Take 1 tablet (100 mg total) by  mouth 2 (two) times daily. 20 tablet 0   No facility-administered medications prior to visit.    Allergies  Allergen Reactions   Bactrim [Sulfamethoxazole-Trimethoprim] Swelling   Morphine And Codeine     Review of Systems  Constitutional:  Positive for appetite change, fatigue and fever.  HENT:  Positive for congestion, ear pain, rhinorrhea, sinus pressure and sneezing. Negative for ear discharge.   Eyes:  Negative for pain and discharge.  Respiratory:  Positive for cough. Negative for chest tightness and shortness of breath.   Cardiovascular:  Negative for chest pain and leg swelling.  Gastrointestinal:  Positive for abdominal pain, diarrhea, nausea and vomiting. Negative for abdominal distention and constipation.  Genitourinary:  Negative for enuresis, flank pain and hematuria.  Musculoskeletal:  Positive for myalgias.  Neurological:  Positive for weakness and headaches.       Objective:        10/02/2023   10:50 AM 09/03/2023    9:45 AM 05/29/2023    9:29 AM  Vitals with BMI  Height 5\' 1"  5\' 1"  5\' 1"   Weight 204 lbs 6 oz 213 lbs 205 lbs  BMI 38.64 40.27 38.75  Systolic 132 124 161  Diastolic 62 76 56  Pulse 137 73 80    Orthostatic VS for the past 72 hrs (Last 3 readings):  Patient Position BP Location Cuff Size  10/02/23 1050 Sitting Left Arm Large     Physical Exam Vitals reviewed.  Constitutional:      Appearance: Normal appearance.  HENT:     Head: Normocephalic.     Salivary Glands: Right salivary gland is not diffusely enlarged or tender. Left salivary gland is not diffusely  enlarged or tender.     Right Ear: Hearing and ear canal normal. No decreased hearing noted. A middle ear effusion is present. No mastoid tenderness. Tympanic membrane is injected and bulging. Tympanic membrane is not erythematous.     Left Ear: Hearing and ear canal normal. No decreased hearing noted. A middle ear effusion is present. No mastoid tenderness. Tympanic membrane is not injected, erythematous or bulging.     Mouth/Throat:     Mouth: Mucous membranes are moist.     Pharynx: Oropharynx is clear. Posterior oropharyngeal erythema present. No oropharyngeal exudate.  Neck:     Vascular: No carotid bruit.  Cardiovascular:     Rate and Rhythm: Normal rate and regular rhythm.     Heart sounds: Normal heart sounds.  Pulmonary:     Effort: Pulmonary effort is normal.     Breath sounds: Examination of the left-lower field reveals rhonchi. Rhonchi present.  Abdominal:     General: Bowel sounds are normal.     Palpations: Abdomen is soft.     Tenderness: There is no abdominal tenderness.  Neurological:     Mental Status: She is alert and oriented to person, place, and time.  Psychiatric:        Mood and Affect: Mood normal.        Behavior: Behavior normal.     Health Maintenance Due  Topic Date Due   Zoster Vaccines- Shingrix (1 of 2) Never done   Pneumococcal Vaccine 58-20 Years old (2 of 2 - PCV) 10/08/2012   Diabetic kidney evaluation - Urine ACR  10/12/2023    There are no preventive care reminders to display for this patient.   Lab Results  Component Value Date   TSH 1.860 09/03/2023   Lab Results  Component Value Date  WBC 11.7 (H) 09/03/2023   HGB 15.2 09/03/2023   HCT 47.2 (H) 09/03/2023   MCV 89 09/03/2023   PLT 289 09/03/2023   Lab Results  Component Value Date   NA 144 09/03/2023   K 4.0 09/03/2023   CO2 22 09/03/2023   GLUCOSE 79 09/03/2023   BUN 21 09/03/2023   CREATININE 0.68 09/03/2023   BILITOT 0.5 09/03/2023   ALKPHOS 67 09/03/2023   AST 16  09/03/2023   ALT 23 09/03/2023   PROT 7.2 09/03/2023   ALBUMIN 4.6 09/03/2023   CALCIUM 9.8 09/03/2023   EGFR 98 09/03/2023   Lab Results  Component Value Date   CHOL 143 09/03/2023   Lab Results  Component Value Date   HDL 58 09/03/2023   Lab Results  Component Value Date   LDLCALC 69 09/03/2023   Lab Results  Component Value Date   TRIG 85 09/03/2023   Lab Results  Component Value Date   CHOLHDL 2.5 09/03/2023   Lab Results  Component Value Date   HGBA1C 7.3 (H) 09/03/2023       Assessment & Plan:  Subacute maxillary sinusitis Assessment & Plan: Persistent symptoms despite recent treatment with doxycycline . Clear nasal drainage and fluid in ears noted on examination. -Administer Kenalog  injection (40mg ) to reduce inflammation and fluid buildup. -Consider change in antibiotic if symptoms persist once nausea is controlled.   Chronic obstructive pulmonary disease, unspecified COPD type (HCC) Assessment & Plan: Persistent cough causing sleep disturbance. Recent use of Breztri  inhaler providing temporary relief. -Increase Breztri  to two puffs in the morning and two puffs at night. -Order chest x-ray to assess for possible lung involvement.  Orders: -     Breztri  Aerosphere; Inhale 2 puffs into the lungs in the morning and at bedtime.  Dispense: 10.7 g; Refill: 2 -     Triamcinolone  Acetonide -     DG Chest 2 View; Future  Diabetic polyneuropathy associated with type 2 diabetes mellitus (HCC) Assessment & Plan: Patient on Tresiba  and Ozempic . Concern for potential impact of steroid treatment on blood sugar levels. -Advise patient to monitor blood sugar levels closely, particularly after Breztri  use and Kenalog  injection. -Adjust Tresiba  dose as needed based on blood sugar readings.   Encounter for screening for COVID-19 Assessment & Plan: Negative for COVID Positive for flu  Orders: -     POC COVID-19 BinaxNow  Flu-like symptoms Assessment &  Plan: Positive test with symptoms of nausea, vomiting, and coughing. Onset of symptoms approximately three days ago. -Continue supportive care with rest and hydration. -Consider Tamiflu if within 48 hours of symptom onset. Nausea, vomiting, and diarrhea for three days. Difficulty keeping down food and water. -Prescribe Zofran  to help control nausea and vomiting. -Advise patient to continue sipping on ice chips and consider Pedialyte for hydration and electrolyte balance.  Orders: -     POCT Influenza A/B -     Ondansetron ; Take 1 tablet (4 mg total) by mouth every 8 (eight) hours as needed for nausea or vomiting.  Dispense: 20 tablet; Refill: 0     Meds ordered this encounter  Medications   Budeson-Glycopyrrol-Formoterol (BREZTRI  AEROSPHERE) 160-9-4.8 MCG/ACT AERO    Sig: Inhale 2 puffs into the lungs in the morning and at bedtime.    Dispense:  10.7 g    Refill:  2    Supervising Provider:   [name], KIRSTEN [id number]   ondansetron  (ZOFRAN -ODT) 4 MG disintegrating tablet    Sig: Take 1 tablet (4 mg total)  by mouth every 8 (eight) hours as needed for nausea or vomiting.    Dispense:  20 tablet    Refill:  0    Supervising Provider:   [name], KIRSTEN [[id number]]   triamcinolone  acetonide (KENALOG -40) injection 40 mg    Orders Placed This Encounter  Procedures   DG Chest 2 View   POC COVID-19   POCT Influenza A/B    General Health Maintenance -Encourage patient to rest and maintain hydration. -Check chest x-ray results and adjust treatment plan as necessary. -Follow up if symptoms do not improve within a week.        Follow-up: Return if symptoms worsen or fail to improve.  An After Visit Summary was printed and given to the patient.  Brandi Velazquez, [name] [phone]

## 2023-10-02 NOTE — Assessment & Plan Note (Signed)
 Negative for COVID Positive for flu

## 2023-10-02 NOTE — Assessment & Plan Note (Signed)
 Patient on Tresiba  and Ozempic . Concern for potential impact of steroid treatment on blood sugar levels. -Advise patient to monitor blood sugar levels closely, particularly after Breztri  use and Kenalog  injection. -Adjust Tresiba  dose as needed based on blood sugar readings.

## 2023-10-02 NOTE — Assessment & Plan Note (Signed)
 Persistent symptoms despite recent treatment with doxycycline . Clear nasal drainage and fluid in ears noted on examination. -Administer Kenalog  injection (40mg ) to reduce inflammation and fluid buildup. -Consider change in antibiotic if symptoms persist once nausea is controlled.

## 2023-10-02 NOTE — Patient Instructions (Signed)
 VISIT SUMMARY:  During today's visit, we discussed your ongoing symptoms and recent developments. You have been experiencing chronic sinusitis, which has not significantly improved with previous treatments. Additionally, you have tested positive for the flu and are experiencing nausea, vomiting, fever, coughing, and shortness of breath. You also reported gastrointestinal upset, including diarrhea, and have had difficulty keeping down food and water. Your diabetes management has been challenging due to your illness, and you have been unable to check your blood sugar levels. We have developed a comprehensive plan to address these issues and help you feel better.  YOUR PLAN:  -INFLUENZA: You have tested positive for the flu, which is causing symptoms like nausea, vomiting, and coughing. We recommend continuing supportive care with rest and hydration. If your symptoms started within the last 48 hours, we may consider prescribing Tamiflu to help reduce the severity and duration of your symptoms.  -SINUSITIS: Your chronic sinusitis has not improved significantly with previous treatment. We administered a Kenalog  injection to reduce inflammation and fluid buildup. If your symptoms persist once your nausea is controlled, we may consider changing your antibiotic.  -GASTROINTESTINAL UPSET: You have been experiencing nausea, vomiting, and diarrhea, making it difficult to keep down food and water. We prescribed Zofran  to help control your nausea and vomiting. Please continue sipping on ice chips and consider drinking Pedialyte to stay hydrated and maintain electrolyte balance.  -COUGH: Your persistent cough is causing sleep disturbances. We recommend increasing your Breztri  inhaler to two puffs in the morning and two puffs at night. We also ordered a chest x-ray to check for any lung involvement.  -DIABETES: Your diabetes management is important, especially with the potential impact of steroid treatment on your  blood sugar levels. Please monitor your blood sugar levels closely, particularly after using the Breztri  inhaler and receiving the Kenalog  injection. Adjust your Tresiba  dose as needed based on your blood sugar readings.  -GENERAL HEALTH MAINTENANCE: We encourage you to rest and maintain hydration. We will review your chest x-ray results and adjust your treatment plan as necessary. Please follow up if your symptoms do not improve within a week.  INSTRUCTIONS:  Please monitor your blood sugar levels closely and adjust your Tresiba  dose as needed. Continue using the Breztri  inhaler as directed and take Zofran  to control nausea and vomiting. Stay hydrated by sipping on ice chips and drinking Pedialyte. Rest and follow up if your symptoms do not improve within a week. We will review your chest x-ray results and adjust your treatment plan as necessary.

## 2023-10-02 NOTE — Assessment & Plan Note (Addendum)
 Positive test with symptoms of nausea, vomiting, and coughing. Onset of symptoms approximately three days ago. -Continue supportive care with rest and hydration. -Consider Tamiflu if within 48 hours of symptom onset. Nausea, vomiting, and diarrhea for three days. Difficulty keeping down food and water. -Prescribe Zofran  to help control nausea and vomiting. -Advise patient to continue sipping on ice chips and consider Pedialyte for hydration and electrolyte balance.

## 2023-10-03 ENCOUNTER — Encounter: Payer: Self-pay | Admitting: Physician Assistant

## 2023-10-06 ENCOUNTER — Encounter: Payer: Self-pay | Admitting: Physician Assistant

## 2023-10-08 ENCOUNTER — Telehealth: Payer: Self-pay

## 2023-10-08 NOTE — Telephone Encounter (Signed)
Called patient, due to provider being out the week of need to reschedule appointment. Left message for patient to call office back.

## 2023-10-28 NOTE — Telephone Encounter (Signed)
 Have made multiple attempts to contact patient, patient confirmed on on 1/15 she did receive her applications in the mail.  She has not returned tham.  I am closing the encounter out.

## 2023-12-04 ENCOUNTER — Ambulatory Visit (INDEPENDENT_AMBULATORY_CARE_PROVIDER_SITE_OTHER): Payer: Medicare HMO | Admitting: Family Medicine

## 2023-12-04 ENCOUNTER — Encounter: Payer: Self-pay | Admitting: Family Medicine

## 2023-12-04 VITALS — BP 112/66 | HR 78 | Temp 98.8°F | Ht 61.0 in | Wt 213.0 lb

## 2023-12-04 DIAGNOSIS — E1142 Type 2 diabetes mellitus with diabetic polyneuropathy: Secondary | ICD-10-CM | POA: Diagnosis not present

## 2023-12-04 DIAGNOSIS — E782 Mixed hyperlipidemia: Secondary | ICD-10-CM

## 2023-12-04 DIAGNOSIS — I152 Hypertension secondary to endocrine disorders: Secondary | ICD-10-CM

## 2023-12-04 DIAGNOSIS — E1159 Type 2 diabetes mellitus with other circulatory complications: Secondary | ICD-10-CM

## 2023-12-04 DIAGNOSIS — J301 Allergic rhinitis due to pollen: Secondary | ICD-10-CM | POA: Diagnosis not present

## 2023-12-04 DIAGNOSIS — R200 Anesthesia of skin: Secondary | ICD-10-CM

## 2023-12-04 DIAGNOSIS — J410 Simple chronic bronchitis: Secondary | ICD-10-CM

## 2023-12-04 DIAGNOSIS — J029 Acute pharyngitis, unspecified: Secondary | ICD-10-CM

## 2023-12-04 MED ORDER — BLOOD GLUCOSE TEST VI STRP: 1.0000 | ORAL_STRIP | Freq: Every day | 3 refills | Status: AC

## 2023-12-04 MED ORDER — DEXCOM G7 SENSOR MISC: 3 refills | Status: DC

## 2023-12-04 MED ORDER — NOVOFINE PEN NEEDLE 32G X 6 MM MISC: 2 refills | Status: DC

## 2023-12-04 MED ORDER — BLOOD GLUCOSE MONITORING SUPPL DEVI: 1.0000 | Freq: Three times a day (TID) | 0 refills | Status: AC

## 2023-12-04 MED ORDER — LANCET DEVICE MISC: 1.0000 | Freq: Every day | 3 refills | Status: AC

## 2023-12-04 MED ORDER — LANCETS MISC. MISC: 1.0000 | Freq: Every day | 3 refills | Status: AC

## 2023-12-04 NOTE — Patient Instructions (Signed)
 Recommend add allegra, zyrtec, or claritin for allergies.  If strep is positive, I will send an antibiotic.

## 2023-12-04 NOTE — Progress Notes (Signed)
Subjective:  Patient ID: Brandi Velazquez, female    DOB: 1961-03-18  Age: 63 y.o. MRN: [id number]  Chief Complaint  Patient presents with   Medical Management of Chronic Issues   Discussed the use of AI scribe software for clinical note transcription with the patient, who gave verbal consent to proceed.  History of Present Illness   Brandi Velazquez is a 63 year old female with diabetes who presents with upper respiratory symptoms and finger numbness.  She has been experiencing upper respiratory symptoms for the past couple of days, initially attributing them to allergies. Despite taking allergy medication last night, her symptoms worsened by morning. No fever, but she has coughing, congestion, and shortness of breath. She uses Flonase nasal spray daily and takes montelukast regularly for allergies. Her Breztri inhaler has been used sparingly, only when needed, and was not required until her current symptoms began.  She reports numbness and tingling in two of her fingers, persisting for months. The sensation is constant and does not affect her thumb. No neck pain, and she can move her fingers without difficulty.  She has a history of diabetes with her last A1c recorded at 7.3. She has not been checking her blood sugars recently due to a non-functional glucose meter, possibly from a dead battery. Her current medications include pravastatin and Zetia for cholesterol, spironolactone, and ramipril for blood pressure, and she uses Flonase and montelukast for allergies.   Diabetes:  Complications:neuropathy Glucose checking: no Glucose logs: nausea/a Hypoglycemia: no symptoms Most recent A1C: 7.3 Current medications: Farxiga 10 mg daily, Tresiba 46 units daily, metformin 1000 mg 1 tablet twice daily, Ozempic 2 mg weekly Last Eye Exam:01/2023.  Had recent cataract surgery and stents for glaucoma.  Eyes doing well  Foot checks: done.    Hyperlipidemia: Current medications: Pravastatin 40 mg  daily, zetia 10 mg before bed.  Hypertension: Complications: Current medications: Ramipril 2.5 mg daily, and spironolactone 25 mg daily  COPD: not using inhalers.   Diet: eating healthy  Exercise: Running after her grandkids       05/29/2023    9:32 AM 02/08/2023    9:38 AM 02/05/2022    3:16 PM 07/18/2021   11:21 AM 07/18/2020   10:09 AM  Depression screen PHQ 2/9  Decreased Interest 0 0 0 0 0  Down, Depressed, Hopeless 0 0 0 0 0  PHQ - 2 Score 0 0 0 0 0  Altered sleeping 0 0     Tired, decreased energy 1 0     Change in appetite 1 1     Feeling bad or failure about yourself  0 0     Trouble concentrating 0 0     Moving slowly or fidgety/restless 0 0     Suicidal thoughts 0 0     PHQ-9 Score 2 1     Difficult doing work/chores Not difficult at all Not difficult at all           05/29/2023    9:31 AM  Fall Risk   Falls in the past year? 1  Number falls in past yr: 1  Injury with Fall? 1  Risk for fall due to : No Fall Risks  Follow up Falls evaluation completed;Falls prevention discussed    Patient Care Team: Blane Ohara, MD as PCP - General (Family Medicine) Zettie Pho, [facility] (Inactive) (Pharmacist)   Review of Systems  Constitutional:  Negative for chills, fatigue and fever.  HENT:  Positive for  congestion and sneezing. Negative for ear pain, rhinorrhea, sinus pressure, sinus pain and sore throat (scratchy).   Respiratory:  Positive for cough and shortness of breath.   Cardiovascular:  Negative for chest pain.  Gastrointestinal:  Negative for abdominal pain, constipation, diarrhea, nausea and vomiting.  Genitourinary:  Negative for dysuria and urgency.  Musculoskeletal:  Positive for back pain. Negative for myalgias.  Neurological:  Negative for dizziness, weakness, light-headedness and headaches.  Psychiatric/Behavioral:  Negative for dysphoric mood. The patient is not nervous/anxious.     Current Outpatient Medications on File Prior to Visit   Medication Sig Dispense Refill   Budeson-Glycopyrrol-Formoterol (BREZTRI AEROSPHERE) 160-9-4.8 MCG/ACT AERO Inhale 2 puffs into the lungs in the morning and at bedtime. 10.7 g 2   dapagliflozin propanediol (FARXIGA) 10 MG TABS tablet Take 1 tablet (10 mg total) by mouth daily. 90 tablet 3   ezetimibe (ZETIA) 10 MG tablet Take 1 tablet (10 mg total) by mouth daily. 90 tablet 0   fluticasone (FLONASE) 50 MCG/ACT nasal spray Place 2 sprays into both nostrils daily. 48 g 3   insulin degludec (TRESIBA FLEXTOUCH) 200 UNIT/ML FlexTouch Pen Inject 46 Units into the skin daily. 108 mL 0   metFORMIN (GLUCOPHAGE) 1000 MG tablet Take 1 tablet (1,000 mg total) by mouth 2 (two) times daily with a meal. 180 tablet 1   montelukast (SINGULAIR) 10 MG tablet TAKE 1 TABLET BY MOUTH AT BEDTIME 90 tablet 3   Multiple Vitamin (MULTIVITAMIN) tablet Take 1 tablet by mouth daily.     omeprazole (PRILOSEC) 40 MG capsule Take 1 capsule (40 mg total) by mouth daily. 90 capsule 1   ondansetron (ZOFRAN-ODT) 4 MG disintegrating tablet Take 1 tablet (4 mg total) by mouth every 8 (eight) hours as needed for nausea or vomiting. 20 tablet 0   pravastatin (PRAVACHOL) 40 MG tablet TAKE ONE TABLET BY MOUTH EVERYDAY AT BEDTIME 90 tablet 1   ramipril (ALTACE) 2.5 MG capsule Take 1 capsule (2.5 mg total) by mouth every morning. 90 capsule 1   Semaglutide, 2 MG/DOSE, (OZEMPIC, 2 MG/DOSE,) 8 MG/3ML SOPN INJECT 2MG  UNDER THE SKIN ONE TIME WEEKLY 9 mL 1   spironolactone (ALDACTONE) 25 MG tablet Take 1 tablet (25 mg total) by mouth daily. 90 tablet 1   No current facility-administered medications on file prior to visit.   Past Medical History:  Diagnosis Date   Ankle pain    Back pain of lumbar region with sciatica 01/07/2020   Chronic pain    back   COPD (chronic obstructive pulmonary disease) (HCC)    Depression    Diabetes mellitus    GERD (gastroesophageal reflux disease)    Hyperlipidemia    Hypertension    Low back pain     Obstructive sleep apnea    Pneumonia 2010   Post-menopausal atrophic vaginitis    Superficial phlebitis    Past Surgical History:  Procedure Laterality Date   BACK SURGERY  1990   CESAREAN SECTION     CYST REMOVAL HAND     inner thigh   REFRACTIVE SURGERY Right 06/07/2022   SPINE SURGERY     lumbar laminectomy and diskectomy    Family History  Problem Relation Age of Onset   Diabetes Mother    Heart disease Mother    Cancer Father    Diabetes Sister    Stroke Sister    Lung disease Neg Hx    Social History   Socioeconomic History   Marital status: Single  Spouse name: Not on file   Number of children: Not on file   Years of education: Not on file   Highest education level: Not on file  Occupational History   Not on file  Tobacco Use   Smoking status: Former    Current packs/day: 0.00    Average packs/day: 1.5 packs/day for 15.0 years (22.5 ttl pk-yrs)    Types: Cigarettes    Start date: 03/20/1994    Quit date: 03/20/2009    Years since quitting: 14.7   Smokeless tobacco: Never  Vaping Use   Vaping status: Never Used  Substance and Sexual Activity   Alcohol use: No   Drug use: No   Sexual activity: Not Currently    Partners: Male  Other Topics Concern   Not on file  Social History Narrative   Not on file   Social Drivers of Health   Financial Resource Strain: High Risk (08/15/2022)   Overall Financial Resource Strain (CARDIA)    Difficulty of Paying Living Expenses: Hard  Food Insecurity: No Food Insecurity (12/04/2023)   Hunger Vital Sign    Worried About Running Out of Food in the Last Year: Never true    Ran Out of Food in the Last Year: Never true  Transportation Needs: No Transportation Needs (12/04/2023)   PRAPARE - Administrator, Civil Service (Medical): No    Lack of Transportation (Non-Medical): No  Physical Activity: Inactive (05/29/2023)   Exercise Vital Sign    Days of Exercise per Week: 0 days    Minutes of Exercise per  Session: 0 min  Stress: No Stress Concern Present (05/29/2023)   [employer] of Occupational Health - Occupational Stress Questionnaire    Feeling of Stress : Not at all  Social Connections: Moderately Isolated (05/29/2023)   Social Connection and Isolation Panel [NHANES]    Frequency of Communication with Friends and Family: More than three times a week    Frequency of Social Gatherings with Friends and Family: More than three times a week    Attends Religious Services: More than 4 times per year    Active Member of [employer] or Organizations: No    Attends Engineer, structural: Not on file    Marital Status: Divorced    Objective:  BP 112/66   Pulse 78   Temp 98.8 F (37.1 C)   Ht 5\' 1"  (1.549 m)   Wt 213 lb (96.6 kg)   SpO2 98%   BMI 40.25 kg/m      12/04/2023    8:34 AM 10/02/2023   10:50 AM 09/03/2023    9:45 AM  BP/Weight  Systolic BP 112 132 124  Diastolic BP 66 62 76  Wt. (Lbs) 213 204.4 213  BMI 40.25 kg/m2 38.62 kg/m2 40.25 kg/m2    Physical Exam Vitals reviewed.  Constitutional:      Appearance: Normal appearance. She is normal weight.  HENT:     Right Ear: Tympanic membrane normal.     Left Ear: Tympanic membrane normal.     Nose: Congestion (pale swollen turbinates.) and rhinorrhea present.     Mouth/Throat:     Pharynx: Posterior oropharyngeal erythema present. No oropharyngeal exudate.  Neck:     Vascular: No carotid bruit.  Cardiovascular:     Rate and Rhythm: Normal rate and regular rhythm.     Heart sounds: Normal heart sounds.  Pulmonary:     Effort: Pulmonary effort is normal. No respiratory distress.  Breath sounds: Normal breath sounds.  Abdominal:     General: Abdomen is flat. Bowel sounds are normal.     Palpations: Abdomen is soft.     Tenderness: There is no abdominal tenderness.  Neurological:     Mental Status: She is alert and oriented to person, place, and time.  Psychiatric:        Mood and Affect: Mood normal.         Behavior: Behavior normal.     Diabetic Foot Exam - Simple   Simple Foot Form Diabetic Foot exam was performed with the following findings: Yes 12/04/2023  9:11 AM  Visual Inspection No deformities, no ulcerations, no other skin breakdown bilaterally: Yes Sensation Testing See comments: Yes Pulse Check Posterior Tibialis and Dorsalis pulse intact bilaterally: Yes Comments Decreased sensation BL feet.       Lab Results  Component Value Date   WBC 12.5 (H) 12/04/2023   HGB 14.6 12/04/2023   HCT 44.6 12/04/2023   PLT 276 12/04/2023   GLUCOSE 108 (H) 12/04/2023   CHOL 129 12/04/2023   TRIG 61 12/04/2023   HDL 60 12/04/2023   LDLCALC 56 12/04/2023   ALT 26 12/04/2023   AST 19 12/04/2023   NA 141 12/04/2023   K 4.4 12/04/2023   CL 103 12/04/2023   CREATININE 0.62 12/04/2023   BUN 18 12/04/2023   CO2 21 12/04/2023   TSH 1.860 09/03/2023   HGBA1C 7.5 (H) 12/04/2023   MICROALBUR 30 05/18/2021      Assessment & Plan:   Hypertension associated with diabetes (HCC) Assessment & Plan: Well controlled.  On spironolactone and ramipril for blood pressure management.   Orders: -     CBC with Differential/Platelet -     Comprehensive metabolic panel  Simple chronic bronchitis (HCC) Assessment & Plan: The current medical regimen is effective;  continue present plan and medications.    Diabetic polyneuropathy associated with type 2 diabetes mellitus (HCC) Assessment & Plan: A1c of 7.3. Blood sugars not checked due to non-functional glucose meter. Discussed obtaining Dexcom G7 continuous glucose monitor, potentially covered by Medicaid. Device is wearable, water-submersible, requires changing every ten days, compatible with smartphones, and can be protected with inexpensive bandages. - Prescribe Dexcom G7 continuous glucose monitor - Send prescription to [facility] pharmacy  Orders: -     Hemoglobin A1c -     Microalbumin / creatinine urine ratio -     Novofine Pen  Needle; Use new needle after every injecion  Dispense: 100 each; Refill: 2 -     Dexcom G7 Sensor; Change every 10 days  Dispense: 10 each; Refill: 3 -     Blood Glucose Monitoring Suppl; 1 each by Does not apply route in the morning, at noon, and at bedtime. May substitute to any manufacturer covered by patient's insurance.  Dispense: 1 each; Refill: 0 -     Blood Glucose Test; 1 each by In Vitro route daily. May substitute to any manufacturer covered by patient's insurance.  Dispense: 100 strip; Refill: 3 -     Lancet Device; 1 each by Does not apply route daily. May substitute to any manufacturer covered by patient's insurance.  Dispense: 100 each; Refill: 3 -     Lancets Misc.; 1 each by Does not apply route daily for 3 days. May substitute to any manufacturer covered by patient's insurance.  Dispense: 100 each; Refill: 3  Mixed hyperlipidemia Assessment & Plan: Well controlled.  No changes to medicines. Pravastatin 40 mg  daily and zetia 10 mg daily. Continue to work on eating a healthy diet and exercise.  Labs drawn today.   Orders: -     Lipid panel  Seasonal allergic rhinitis due to pollen Assessment & Plan: Uses Flonase nasal spray and montelukast. Symptoms suggest allergies. Additional over-the-counter antihistamines may be beneficial due to different mechanisms of action. - Recommend over-the-counter antihistamines such as Claritin, Allegra, or Zyrtec if needed   Pharyngitis, unspecified etiology Assessment & Plan: Coughing, congestion, and shortness of breath, initially suspected as allergies, have worsened. No fever. Physical exam suggests possible allergies. Strep test ordered to rule out streptococcal infection. - Perform strep test - If strep test is negative, recommend Zyrtec, Claritin, or Allegra - Advise Breztri inhaler two puffs twice daily if shortness of breath worsens  Orders: -     POCT rapid strep A  Numbness of fingers Assessment & Plan: Numbness and  tingling in two fingers for months. No neck pain. Differential includes peripheral neuropathy or other nerve-related issues. Carpal tunnel syndrome unlikely as thumb is not involved and tests were negative. Nerve conduction study warranted. - Order nerve conduction study in [place] - Consider additional blood tests  Orders: -     Nerve conduction test; Future     Meds ordered this encounter  Medications   Insulin Pen Needle (NOVOFINE PEN NEEDLE) 32G X 6 MM MISC    Sig: Use new needle after every injecion    Dispense:  100 each    Refill:  2   Continuous Glucose Sensor (DEXCOM G7 SENSOR) MISC    Sig: Change every 10 days    Dispense:  10 each    Refill:  3   Blood Glucose Monitoring Suppl DEVI    Sig: 1 each by Does not apply route in the morning, at noon, and at bedtime. May substitute to any manufacturer covered by patient's insurance.    Dispense:  1 each    Refill:  0   Glucose Blood (BLOOD GLUCOSE TEST STRIPS) STRP    Sig: 1 each by In Vitro route daily. May substitute to any manufacturer covered by patient's insurance.    Dispense:  100 strip    Refill:  3   Lancet Device MISC    Sig: 1 each by Does not apply route daily. May substitute to any manufacturer covered by patient's insurance.    Dispense:  100 each    Refill:  3   Lancets Misc. MISC    Sig: 1 each by Does not apply route daily for 3 days. May substitute to any manufacturer covered by patient's insurance.    Dispense:  100 each    Refill:  3    Orders Placed This Encounter  Procedures   CBC with Differential/Platelet   Comprehensive metabolic panel   Hemoglobin A1c   Lipid panel   Microalbumin / creatinine urine ratio   POCT rapid strep A   Nerve conduction test     Follow-up: Return in about 3 months (around 03/05/2024) for chronic follow up.   I,Marla I Leal-Borjas,acting as a scribe for Blane Ohara, MD.,have documented all relevant documentation on the behalf of Blane Ohara, MD,as directed by   Blane Ohara, MD while in the presence of Blane Ohara, MD.   An After Visit Summary was printed and given to the patient.  I attest that I have reviewed this visit and agree with the plan scribed by my staff.   Blane Ohara, MD [facility] [phone]  [phone]    

## 2023-12-05 ENCOUNTER — Encounter: Payer: Self-pay | Admitting: Family Medicine

## 2023-12-05 LAB — COMPREHENSIVE METABOLIC PANEL
ALT: 26 IU/L (ref 0–32)
AST: 19 IU/L (ref 0–40)
Albumin: 4.3 g/dL (ref 3.9–4.9)
Alkaline Phosphatase: 57 IU/L (ref 44–121)
BUN/Creatinine Ratio: 29 — ABNORMAL HIGH (ref 12–28)
BUN: 18 mg/dL (ref 8–27)
Bilirubin Total: 0.4 mg/dL (ref 0.0–1.2)
CO2: 21 mmol/L (ref 20–29)
Calcium: 9.7 mg/dL (ref 8.7–10.3)
Chloride: 103 mmol/L (ref 96–106)
Creatinine, Ser: 0.62 mg/dL (ref 0.57–1.00)
Globulin, Total: 2.3 g/dL (ref 1.5–4.5)
Glucose: 108 mg/dL — ABNORMAL HIGH (ref 70–99)
Potassium: 4.4 mmol/L (ref 3.5–5.2)
Sodium: 141 mmol/L (ref 134–144)
Total Protein: 6.6 g/dL (ref 6.0–8.5)
eGFR: 100 mL/min/{1.73_m2} (ref >59)

## 2023-12-05 LAB — CBC WITH DIFFERENTIAL/PLATELET
Basophils Absolute: 0 10*3/uL (ref 0.0–0.2)
Basos: 0 %
EOS (ABSOLUTE): 0.4 10*3/uL (ref 0.0–0.4)
Eos: 3 %
Hematocrit: 44.6 % (ref 34.0–46.6)
Hemoglobin: 14.6 g/dL (ref 11.1–15.9)
Immature Grans (Abs): 0 10*3/uL (ref 0.0–0.1)
Immature Granulocytes: 0 %
Lymphocytes Absolute: 2.7 10*3/uL (ref 0.7–3.1)
Lymphs: 22 %
MCH: 28.3 pg (ref 26.6–33.0)
MCHC: 32.7 g/dL (ref 31.5–35.7)
MCV: 87 fL (ref 79–97)
Monocytes Absolute: 0.9 10*3/uL (ref 0.1–0.9)
Monocytes: 7 %
Neutrophils Absolute: 8.4 10*3/uL — ABNORMAL HIGH (ref 1.4–7.0)
Neutrophils: 68 %
Platelets: 276 10*3/uL (ref 150–450)
RBC: 5.15 x10E6/uL (ref 3.77–5.28)
RDW: 13.1 % (ref 11.7–15.4)
WBC: 12.5 10*3/uL — ABNORMAL HIGH (ref 3.4–10.8)

## 2023-12-05 LAB — HEMOGLOBIN A1C
Est. average glucose Bld gHb Est-mCnc: 169 mg/dL
Hgb A1c MFr Bld: 7.5 % — ABNORMAL HIGH (ref 4.8–5.6)

## 2023-12-05 LAB — LIPID PANEL
Chol/HDL Ratio: 2.2 ratio (ref 0.0–4.4)
Cholesterol, Total: 129 mg/dL (ref 100–199)
HDL: 60 mg/dL (ref >39)
LDL Chol Calc (NIH): 56 mg/dL (ref 0–99)
Triglycerides: 61 mg/dL (ref 0–149)
VLDL Cholesterol Cal: 13 mg/dL (ref 5–40)

## 2023-12-05 LAB — MICROALBUMIN / CREATININE URINE RATIO
Creatinine, Urine: 50.9 mg/dL
Microalb/Creat Ratio: 10 mg/g{creat} (ref 0–29)
Microalbumin, Urine: 4.9 ug/mL

## 2023-12-08 ENCOUNTER — Encounter: Payer: Self-pay | Admitting: Family Medicine

## 2023-12-08 DIAGNOSIS — R200 Anesthesia of skin: Secondary | ICD-10-CM | POA: Insufficient documentation

## 2023-12-08 LAB — POCT RAPID STREP A (OFFICE): Rapid Strep A Screen: NEGATIVE

## 2023-12-08 NOTE — Assessment & Plan Note (Signed)
 Uses Flonase nasal spray and montelukast. Symptoms suggest allergies. Additional over-the-counter antihistamines may be beneficial due to different mechanisms of action. - Recommend over-the-counter antihistamines such as Claritin, Allegra, or Zyrtec if needed

## 2023-12-08 NOTE — Assessment & Plan Note (Signed)
 Coughing, congestion, and shortness of breath, initially suspected as allergies, have worsened. No fever. Physical exam suggests possible allergies. Strep test ordered to rule out streptococcal infection. - Perform strep test - If strep test is negative, recommend Zyrtec, Claritin, or Allegra - Advise Breztri inhaler two puffs twice daily if shortness of breath worsens

## 2023-12-08 NOTE — Assessment & Plan Note (Signed)
 The current medical regimen is effective;  continue present plan and medications.

## 2023-12-08 NOTE — Assessment & Plan Note (Addendum)
 Well controlled.  No changes to medicines. Pravastatin 40 mg daily and zetia 10 mg daily. Continue to work on eating a healthy diet and exercise.  Labs drawn today.

## 2023-12-08 NOTE — Assessment & Plan Note (Signed)
 A1c of 7.3. Blood sugars not checked due to non-functional glucose meter. Discussed obtaining Dexcom G7 continuous glucose monitor, potentially covered by Medicaid. Device is wearable, water-submersible, requires changing every ten days, compatible with smartphones, and can be protected with inexpensive bandages. - Prescribe Dexcom G7 continuous glucose monitor - Send prescription to [facility] pharmacy

## 2023-12-08 NOTE — Assessment & Plan Note (Addendum)
 Well controlled.  On spironolactone and ramipril for blood pressure management.

## 2023-12-08 NOTE — Assessment & Plan Note (Signed)
 Numbness and tingling in two fingers for months. No neck pain. Differential includes peripheral neuropathy or other nerve-related issues. Carpal tunnel syndrome unlikely as thumb is not involved and tests were negative. Nerve conduction study warranted. - Order nerve conduction study in [place] - Consider additional blood tests

## 2023-12-17 ENCOUNTER — Ambulatory Visit: Payer: Medicare HMO | Admitting: Family Medicine

## 2024-01-15 DIAGNOSIS — H40013 Open angle with borderline findings, low risk, bilateral: Secondary | ICD-10-CM | POA: Diagnosis not present

## 2024-01-29 ENCOUNTER — Telehealth: Payer: Self-pay | Admitting: Family Medicine

## 2024-01-29 NOTE — Telephone Encounter (Unsigned)
 Copied from CRM [id number]. Topic: Clinical - Medication Question >> Jan 29, 2024 10:39 AM Baldomero Bone wrote: Reason for CRM: Patient states that Patient Assistance usually gets Semaglutide , 2 MG/DOSE, (OZEMPIC , 2 MG/DOSE,) 8 MG/3ML SOPN for the patient. Patient is ready for the next dosage. Callback number is [phone]

## 2024-01-30 ENCOUNTER — Telehealth: Payer: Self-pay

## 2024-01-30 NOTE — Progress Notes (Unsigned)
   01/30/2024  Patient ID: Brandi Velazquez, female   DOB: August 09, 1961, 63 y.o.   MRN: [id number]  Attempted to reach patient regarding ozempic  patient assistance. Message forwarded to me states she was expecting to receive 2mg . I see previously that the provider section was completed in chart but do not see a patient section completed or a note stating it was sent out. Will route to CPhT for support/f/u.   I can also be reached at the number below if needed.  Rolando Cliche, PharmD, BCGP Clinical Pharmacist  [phone]

## 2024-01-30 NOTE — Telephone Encounter (Signed)
 Called patient and she stated that she has not heard anything form Patient assistance and paperwork was done in March. Let patient know I will check with Burdette Carolin (LPN) and give her a call back.  Spoke with Burdette Carolin and she is going to check on patient application   Copied from CRM [id number]. Topic: Clinical - Medication Question >> Jan 29, 2024 10:39 AM Baldomero Bone wrote: Reason for CRM: Patient states that Patient Assistance usually gets Semaglutide , 2 MG/DOSE, (OZEMPIC , 2 MG/DOSE,) 8 MG/3ML SOPN for the patient. Patient is ready for the next dosage. Callback number is [phone] >> Jan 30, 2024  1:42 PM Crispin Dolphin wrote: Patient called back to check status. States her next dose is due Sunday and she knows office closes early on Friday and would like someone to call her back and let her know what she can do to get medication for Sunday since she has not heard back from Patient assistance program. Thank You

## 2024-02-03 NOTE — Telephone Encounter (Signed)
 Completed Novo Nordisk patient assistance application faxed.

## 2024-02-03 NOTE — Telephone Encounter (Signed)
 LM for patient - she needs to come by the office to sign pages 1-3 of the Novo Nordisk patient assistance application.  App left at check in desk.

## 2024-02-11 ENCOUNTER — Telehealth: Payer: Self-pay

## 2024-02-11 NOTE — Telephone Encounter (Signed)
 PAP: Patient assistance application for Ozempic  and Tresiba  has been approved by PAP Companies: NovoNordisk from 02/07/2024 to 09/16/2024. Medication should be delivered to PAP Delivery: Provider's office. For further shipping updates, please contact Novo Nordisk at [phone]. Patient ID is: [id number]

## 2024-02-11 NOTE — Progress Notes (Signed)
 Pharmacy Medication Assistance Program Note    02/11/2024  Patient ID: Brandi Velazquez, female   DOB: 1961-09-06, 63 y.o.   MRN: [id number]     08/28/2023 02/11/2024  Outreach Medication One  Manufacturer Medication One Novo Nordisk Novo Nordisk  Nordisk Drugs Ozempic  Tresiba   Dose of Ozempic  2mg /week 2mg   Dose of Tresiba  46u/day u200  Type of Forensic scientist Assistance  Date Application Sent to Patient 08/28/2023   Application Items Requested Application;Proof of Income   Date Application Sent to Prescriber 08/28/2023   Name of Prescriber Mercy Stall   Patient Assistance Determination  Approved  Approval Start Date  02/07/2024  Approval End Date  09/16/2024  Patient Notification Method  Telephone Call     Signature

## 2024-02-21 ENCOUNTER — Telehealth: Payer: Self-pay

## 2024-02-21 NOTE — Telephone Encounter (Signed)
 Patient was informed that we received patient assistance for Tresiba , pen needles, and ozempic . She will come by the office to pick them up possible today.

## 2024-02-21 NOTE — Telephone Encounter (Signed)
 Patient picked up patient assistance

## 2024-02-25 ENCOUNTER — Other Ambulatory Visit: Payer: Self-pay | Admitting: Family Medicine

## 2024-02-25 DIAGNOSIS — E782 Mixed hyperlipidemia: Secondary | ICD-10-CM

## 2024-03-02 ENCOUNTER — Other Ambulatory Visit: Payer: Self-pay | Admitting: Family Medicine

## 2024-03-02 DIAGNOSIS — J302 Other seasonal allergic rhinitis: Secondary | ICD-10-CM

## 2024-03-17 ENCOUNTER — Ambulatory Visit: Admitting: Family Medicine

## 2024-03-27 ENCOUNTER — Other Ambulatory Visit: Payer: Self-pay | Admitting: Family Medicine

## 2024-03-30 ENCOUNTER — Telehealth: Admitting: Physician Assistant

## 2024-03-30 ENCOUNTER — Ambulatory Visit: Payer: Self-pay

## 2024-03-30 DIAGNOSIS — J020 Streptococcal pharyngitis: Secondary | ICD-10-CM

## 2024-03-30 MED ORDER — AMOXICILLIN-POT CLAVULANATE 875-125 MG PO TABS
1.0000 | ORAL_TABLET | Freq: Two times a day (BID) | ORAL | 0 refills | Status: DC
Start: 2024-03-30 — End: 2024-04-14

## 2024-03-30 MED ORDER — ONDANSETRON 4 MG PO TBDP: 4.0000 mg | ORAL_TABLET | Freq: Three times a day (TID) | ORAL | 0 refills | Status: DC | PRN

## 2024-03-30 NOTE — Patient Instructions (Signed)
 Brandi Velazquez, thank you for joining Brandi CHRISTELLA Dickinson, PA-C for today's virtual visit.  While this provider is not your primary care provider (PCP), if your PCP is located in our provider database this encounter information will be shared with them immediately following your visit.   A [facility] MyChart account gives you access to today's visit and all your visits, tests, and labs performed at [facility]  click here if you don't have a [facility] MyChart account or go to [email or web address]  Consent: (Patient) Brandi Velazquez provided verbal consent for this virtual visit at the beginning of the encounter.  Current Medications:  Current Outpatient Medications:    amoxicillin -clavulanate (AUGMENTIN ) 875-125 MG tablet, Take 1 tablet by mouth 2 (two) times daily., Disp: 20 tablet, Rfl: 0   ondansetron  (ZOFRAN -ODT) 4 MG disintegrating tablet, Take 1-2 tablets (4-8 mg total) by mouth every 8 (eight) hours as needed., Disp: 20 tablet, Rfl: 0   Blood Glucose Monitoring Suppl DEVI, 1 each by Does not apply route in the morning, at noon, and at bedtime. May substitute to any manufacturer covered by patient's insurance., Disp: 1 each, Rfl: 0   Budeson-Glycopyrrol-Formoterol (BREZTRI  AEROSPHERE) 160-9-4.8 MCG/ACT AERO, Inhale 2 puffs into the lungs in the morning and at bedtime., Disp: 10.7 g, Rfl: 2   Continuous Glucose Sensor (DEXCOM G7 SENSOR) MISC, Change every 10 days, Disp: 10 each, Rfl: 3   dapagliflozin  propanediol (FARXIGA ) 10 MG TABS tablet, Take 1 tablet (10 mg total) by mouth daily., Disp: 90 tablet, Rfl: 3   ezetimibe  (ZETIA ) 10 MG tablet, Take 1 tablet by mouth once daily, Disp: 90 tablet, Rfl: 0   fluticasone  (FLONASE ) 50 MCG/ACT nasal spray, USE 2 SPRAY(S) IN EACH NOSTRIL ONCE DAILY AS DIRECTED, Disp: 16 g, Rfl: 0   Glucose Blood (BLOOD GLUCOSE TEST STRIPS) STRP, 1 each by In Vitro route daily. May substitute to any manufacturer covered by patient's insurance.,  Disp: 100 strip, Rfl: 3   insulin degludec  (TRESIBA  FLEXTOUCH) 200 UNIT/ML FlexTouch Pen, Inject 46 Units into the skin daily., Disp: 108 mL, Rfl: 0   Insulin Pen Needle (NOVOFINE PEN NEEDLE) 32G X 6 MM MISC, Use new needle after every injecion, Disp: 100 each, Rfl: 2   metFORMIN  (GLUCOPHAGE ) 1000 MG tablet, Take 1 tablet by mouth twice daily with food, Disp: 180 tablet, Rfl: 0   montelukast  (SINGULAIR ) 10 MG tablet, TAKE 1 TABLET BY MOUTH AT BEDTIME, Disp: 90 tablet, Rfl: 3   Multiple Vitamin (MULTIVITAMIN) tablet, Take 1 tablet by mouth daily., Disp: , Rfl:    omeprazole  (PRILOSEC) 40 MG capsule, Take 1 capsule (40 mg total) by mouth daily., Disp: 90 capsule, Rfl: 1   pravastatin  (PRAVACHOL ) 40 MG tablet, TAKE 1 TABLET BY MOUTH ONCE DAILY AT BEDTIME, Disp: 90 tablet, Rfl: 0   ramipril  (ALTACE ) 2.5 MG capsule, Take 1 capsule by mouth in the morning, Disp: 90 capsule, Rfl: 0   Semaglutide , 2 MG/DOSE, (OZEMPIC , 2 MG/DOSE,) 8 MG/3ML SOPN, INJECT 2MG  UNDER THE SKIN ONE TIME WEEKLY, Disp: 9 mL, Rfl: 1   spironolactone  (ALDACTONE ) 25 MG tablet, Take 1 tablet by mouth once daily, Disp: 90 tablet, Rfl: 0   Medications ordered in this encounter:  Meds ordered this encounter  Medications   amoxicillin -clavulanate (AUGMENTIN ) 875-125 MG tablet    Sig: Take 1 tablet by mouth 2 (two) times daily.    Dispense:  20 tablet    Refill:  0    Supervising Provider:   BLAISE, PHILIP  O [[id number]]   ondansetron  (ZOFRAN -ODT) 4 MG disintegrating tablet    Sig: Take 1-2 tablets (4-8 mg total) by mouth every 8 (eight) hours as needed.    Dispense:  20 tablet    Refill:  0    Supervising Provider:   LAMPTEY, PHILIP O [[id number]]     *If you need refills on other medications prior to your next appointment, please contact your pharmacy*  Follow-Up: Call back or seek an in-person evaluation if the symptoms worsen or if the condition fails to improve as anticipated.  [facility] [phone]  Other  Instructions Strep Throat, Adult Strep throat is an infection in the throat that is caused by bacteria. It is common during the cold months of the year. It mostly affects children who are 40-86 years old. However, people of all ages can get it at any time of the year. This infection spreads from person to person (is contagious) through coughing, sneezing, or having close contact. Your health care provider may use other names to describe the infection. When strep throat affects the tonsils, it is called tonsillitis. When it affects the back of the throat, it is called pharyngitis. What are the causes? This condition is caused by the Streptococcus pyogenes bacteria. What increases the risk? You are more likely to develop this condition if: You care for school-age children, or are around school-age children. Children are more likely to get strep throat and may spread it to others. You spend time in crowded places where the infection can spread easily. You have close contact with someone who has strep throat. What are the signs or symptoms? Symptoms of this condition include: Fever or chills. Redness, swelling, or pain in the tonsils or throat. Pain or difficulty when swallowing. White or yellow spots on the tonsils or throat. Tender glands in the neck and under the jaw. Bad smelling breath. Red rash all over the body. This is rare. How is this diagnosed? This condition is diagnosed by tests that check for the presence and the amount of bacteria that cause strep throat. They are: Rapid strep test. Your throat is swabbed and checked for the presence of bacteria. Results are usually ready in minutes. Throat culture test. Your throat is swabbed. The sample is placed in a cup that allows infections to grow. Results are usually ready in 1 or 2 days. How is this treated? This condition may be treated with: Medicines that kill germs (antibiotics). Medicines that relieve pain or fever. These  include: Ibuprofen  or acetaminophen . Aspirin, only for people who are over the age of 67. Throat lozenges. Throat sprays. Follow these instructions at home: Medicines  Take over-the-counter and prescription medicines only as told by your health care provider. Take your antibiotic medicine as told by your health care provider. Do not stop taking the antibiotic even if you start to feel better. Eating and drinking  If you have trouble swallowing, try eating soft foods until your sore throat feels better. Drink enough fluid to keep your urine pale yellow. To help relieve pain, you may have: Warm fluids, such as soup and tea. Cold fluids, such as frozen desserts or popsicles. General instructions Gargle with a salt-water mixture 3-4 times a day or as needed. To make a salt-water mixture, completely dissolve -1 tsp (3-6 g) of salt in 1 cup (237 mL) of warm water. Get plenty of rest. Stay home from work or school until you have been taking antibiotics for 24 hours. Do not  use any products that contain nicotine or tobacco. These products include cigarettes, chewing tobacco, and vaping devices, such as e-cigarettes. If you need help quitting, ask your health care provider. It is up to you to get your test results. Ask your health care provider, or the department that is doing the test, when your results will be ready. Keep all follow-up visits. This is important. How is this prevented?  Do not share food, drinking cups, or personal items that could cause the infection to spread to other people. Wash your hands often with soap and water for at least 20 seconds. If soap and water are not available, use hand sanitizer. Make sure that all people in your house wash their hands well. Have family members tested if they have a sore throat or fever. They may need an antibiotic if they have strep throat. Contact a health care provider if: You have swelling in your neck that keeps getting bigger. You  develop a rash, cough, or earache. You cough up a thick mucus that is green, yellow-brown, or bloody. You have pain or discomfort that does not get better with medicine. Your symptoms seem to be getting worse. You have a fever. Get help right away if: You have new symptoms, such as vomiting, severe headache, stiff or painful neck, chest pain, or shortness of breath. You have severe throat pain, drooling, or changes in your voice. You have swelling of the neck, or the skin on the neck becomes red and tender. You have signs of dehydration, such as tiredness (fatigue), dry mouth, and decreased urination. You become increasingly sleepy, or you cannot wake up completely. Your joints become red or painful. These symptoms may represent a serious problem that is an emergency. Do not wait to see if the symptoms will go away. Get medical help right away. Call your local emergency services (911 in the [place] Do not drive yourself to the hospital. Summary Strep throat is an infection in the throat that is caused by the Streptococcus pyogenes bacteria. This infection is spread from person to person (is contagious) through coughing, sneezing, or having close contact. Take your medicines, including antibiotics, as told by your health care provider. Do not stop taking the antibiotic even if you start to feel better. To prevent the spread of germs, wash your hands well with soap and water. Have others do the same. Do not share food, drinking cups, or personal items. Get help right away if you have new symptoms, such as vomiting, severe headache, stiff or painful neck, chest pain, or shortness of breath. This information is not intended to replace advice given to you by your health care provider. Make sure you discuss any questions you have with your health care provider. Document Revised: 12/27/2020 Document Reviewed: 12/27/2020 Elsevier Patient Education  2024 [employer]   If you have been instructed to  have an in-person evaluation today at a local Urgent Care facility, please use the link below. It will take you to a list of all of our available [facility], including address, phone number and hours of operation. Please do not delay care.  [facility]  If you or a family member do not have a primary care provider, use the link below to schedule a visit and establish care. When you choose a [facility] primary care physician or advanced practice provider, you gain a long-term partner in health. Find a Primary Care Provider  Learn more about 's in-office and  virtual care options: [place] - Get Care Now

## 2024-03-30 NOTE — Telephone Encounter (Signed)
 FYI Only or Action Required?: FYI only for provider.  Patient was last seen in primary care on 12/04/2023 by Sherre Clapper, MD.  Called Nurse Triage reporting Nausea.  Symptoms began 03/27/2024.  Interventions attempted: Nothing.  Symptoms are: gradually worsening.  Triage Disposition: No disposition on file.  Patient/caregiver understands and will follow disposition?:          Copied from CRM [id number]. Topic: Clinical - Red Word Triage >> Mar 30, 2024  4:04 PM Wess RAMAN wrote: Red Word that prompted transfer to Nurse Triage: Vomiting since 03/27/24. Can't keep food down and some liquids come up as well. Has been exposed to strep. Her daughter has strep. Fever (101), fatigue Reason for Disposition  Nausea lasts > 1 week  [1] Sore throat is the only symptom AND [2] present > 48 hours  Answer Assessment - Initial Assessment Questions 1. NAUSEA SEVERITY: How bad is the nausea? (e.g., mild, moderate, severe; dehydration, weight loss)     severe 2. ONSET: When did the nausea begin?     03/27/2024 3. VOMITING: Any vomiting? If Yes, ask: How many times today?     yes 4. RECURRENT SYMPTOM: Have you had nausea before? If Yes, ask: When was the last time? What happened that time?     no 5. CAUSE: What do you think is causing the nausea?     no 6. PREGNANCY: Is there any chance you are pregnant? (e.g., unprotected intercourse, missed birth control pill, broken condom)     Na  Pt states fatigue started 03/27/24  Answer Assessment - Initial Assessment Questions 1. ONSET: When did the throat start hurting? (Hours or days ago)      03/27/2024 2. SEVERITY: How bad is the sore throat? (Scale 1-10; mild, moderate or severe)     Mild to moderate 3. STREP EXPOSURE: Has there been any exposure to strep within the past week? If Yes, ask: What type of contact occurred?      yes 4.  VIRAL SYMPTOMS: Are there any symptoms of a cold, such as a runny nose, cough, hoarse  voice or red eyes?     hoarseness 5. FEVER: Do you have a fever? If Yes, ask: What is your temperature, how was it measured, and when did it start?     101 6. PUS ON THE TONSILS: Is there pus on the tonsils in the back of your throat?     no 7. OTHER SYMPTOMS: Do you have any other symptoms? (e.g., difficulty breathing, headache, rash)     Some sore throat, headache-severe : forehead area 8. PREGNANCY: Is there any chance you are pregnant? When was your last menstrual period?     Na   Drainage in back of throat.  Pt stated he daughter has/had strep and pt would like to check if she has strep or something contagious before going to her face to face appointment this week.  Protocols used: Nausea-A-AH, Sore Throat-A-AH

## 2024-03-30 NOTE — Progress Notes (Signed)
 Virtual Visit Consent   Brandi Velazquez, you are scheduled for a virtual visit with a [facility] provider today. Just as with appointments in the office, your consent must be obtained to participate. Your consent will be active for this visit and any virtual visit you may have with one of our providers in the next 365 days. If you have a MyChart account, a copy of this consent can be sent to you electronically.  As this is a virtual visit, video technology does not allow for your provider to perform a traditional examination. This may limit your provider's ability to fully assess your condition. If your provider identifies any concerns that need to be evaluated in person or the need to arrange testing (such as labs, EKG, etc.), we will make arrangements to do so. Although advances in technology are sophisticated, we cannot ensure that it will always work on either your end or our end. If the connection with a video visit is poor, the visit may have to be switched to a telephone visit. With either a video or telephone visit, we are not always able to ensure that we have a secure connection.  By engaging in this virtual visit, you consent to the provision of healthcare and authorize for your insurance to be billed (if applicable) for the services provided during this visit. Depending on your insurance coverage, you may receive a charge related to this service.  I need to obtain your verbal consent now. Are you willing to proceed with your visit today? Brandi Velazquez has provided verbal consent on 03/30/2024 for a virtual visit (video or telephone). Delon CHRISTELLA Dickinson, PA-C  Date: 03/30/2024 5:01 PM   Virtual Visit via Video Note   I, Delon CHRISTELLA Dickinson, connected with  Brandi Velazquez  ([id number], 08/31/1961) on 03/30/24 at  5:15 PM EDT by a video-enabled telemedicine application and verified that I am speaking with the correct person using two identifiers.  Location: Patient: Virtual Visit  Location Patient: Home Provider: Virtual Visit Location Provider: Home Office   I discussed the limitations of evaluation and management by telemedicine and the availability of in person appointments. The patient expressed understanding and agreed to proceed.    History of Present Illness: Brandi Velazquez is a 63 y.o. who identifies as a female who was assigned female at birth, and is being seen today for sore throat, nausea, vomiting.  HPI: Sore Throat  This is a new problem. The current episode started in the past 7 days (03/27/24). The problem has been unchanged. The maximum temperature recorded prior to her arrival was 101 - 101.9 F. The fever has been present for 1 to 2 days. The pain is moderate. Associated symptoms include congestion, headaches, swollen glands (right), trouble swallowing and vomiting (all mucus). Pertinent negatives include no coughing, diarrhea, ear discharge, ear pain, hoarse voice or plugged ear sensation. She has had exposure to strep. Exposure to: daughter. Treatments tried: ibuprofen . The treatment provided no relief.     Problems:  Patient Active Problem List   Diagnosis Date Noted   Numbness of fingers 12/08/2023   Pharyngitis 12/04/2023   Sinusitis 10/02/2023   Neuropathy 09/08/2023   Urinary frequency 09/08/2023   Tuberculosis screening 06/02/2023   Varicose veins during pregnancy 02/10/2023   Need for influenza vaccination 06/11/2022   Chronic pain of left knee 05/24/2022   Seasonal allergic rhinitis 03/03/2022   COPD (chronic obstructive pulmonary disease) (HCC) 02/05/2022   Abnormally low peak expiratory flow rate  11/22/2021   Diabetic polyneuropathy associated with type 2 diabetes mellitus (HCC) 05/23/2021   Achilles tendon disorder, right 05/23/2021   Class 2 severe obesity due to excess calories with serious comorbidity and body mass index (BMI) of 38.0 to 38.9 in adult [facility]) 10/30/2020   Hyperlipidemia    Hypertension associated with diabetes  (HCC)    Chronic midline low back pain without sciatica 01/10/2020   Varicose veins of both lower extremities with pain 06/14/2011    Allergies:  Allergies  Allergen Reactions   Bactrim [Sulfamethoxazole-Trimethoprim] Swelling   Morphine And Codeine    Medications:  Current Outpatient Medications:    amoxicillin -clavulanate (AUGMENTIN ) 875-125 MG tablet, Take 1 tablet by mouth 2 (two) times daily., Disp: 20 tablet, Rfl: 0   ondansetron  (ZOFRAN -ODT) 4 MG disintegrating tablet, Take 1-2 tablets (4-8 mg total) by mouth every 8 (eight) hours as needed., Disp: 20 tablet, Rfl: 0   Blood Glucose Monitoring Suppl DEVI, 1 each by Does not apply route in the morning, at noon, and at bedtime. May substitute to any manufacturer covered by patient's insurance., Disp: 1 each, Rfl: 0   Budeson-Glycopyrrol-Formoterol (BREZTRI  AEROSPHERE) 160-9-4.8 MCG/ACT AERO, Inhale 2 puffs into the lungs in the morning and at bedtime., Disp: 10.7 g, Rfl: 2   Continuous Glucose Sensor (DEXCOM G7 SENSOR) MISC, Change every 10 days, Disp: 10 each, Rfl: 3   dapagliflozin  propanediol (FARXIGA ) 10 MG TABS tablet, Take 1 tablet (10 mg total) by mouth daily., Disp: 90 tablet, Rfl: 3   ezetimibe  (ZETIA ) 10 MG tablet, Take 1 tablet by mouth once daily, Disp: 90 tablet, Rfl: 0   fluticasone  (FLONASE ) 50 MCG/ACT nasal spray, USE 2 SPRAY(S) IN EACH NOSTRIL ONCE DAILY AS DIRECTED, Disp: 16 g, Rfl: 0   Glucose Blood (BLOOD GLUCOSE TEST STRIPS) STRP, 1 each by In Vitro route daily. May substitute to any manufacturer covered by patient's insurance., Disp: 100 strip, Rfl: 3   insulin degludec  (TRESIBA  FLEXTOUCH) 200 UNIT/ML FlexTouch Pen, Inject 46 Units into the skin daily., Disp: 108 mL, Rfl: 0   Insulin Pen Needle (NOVOFINE PEN NEEDLE) 32G X 6 MM MISC, Use new needle after every injecion, Disp: 100 each, Rfl: 2   metFORMIN  (GLUCOPHAGE ) 1000 MG tablet, Take 1 tablet by mouth twice daily with food, Disp: 180 tablet, Rfl: 0   montelukast   (SINGULAIR ) 10 MG tablet, TAKE 1 TABLET BY MOUTH AT BEDTIME, Disp: 90 tablet, Rfl: 3   Multiple Vitamin (MULTIVITAMIN) tablet, Take 1 tablet by mouth daily., Disp: , Rfl:    omeprazole  (PRILOSEC) 40 MG capsule, Take 1 capsule (40 mg total) by mouth daily., Disp: 90 capsule, Rfl: 1   pravastatin  (PRAVACHOL ) 40 MG tablet, TAKE 1 TABLET BY MOUTH ONCE DAILY AT BEDTIME, Disp: 90 tablet, Rfl: 0   ramipril  (ALTACE ) 2.5 MG capsule, Take 1 capsule by mouth in the morning, Disp: 90 capsule, Rfl: 0   Semaglutide , 2 MG/DOSE, (OZEMPIC , 2 MG/DOSE,) 8 MG/3ML SOPN, INJECT 2MG  UNDER THE SKIN ONE TIME WEEKLY, Disp: 9 mL, Rfl: 1   spironolactone  (ALDACTONE ) 25 MG tablet, Take 1 tablet by mouth once daily, Disp: 90 tablet, Rfl: 0  Observations/Objective: Patient is well-developed, well-nourished in no acute distress.  Resting comfortably at home.  Head is normocephalic, atraumatic.  No labored breathing.  Speech is clear and coherent with logical content.  Patient is alert and oriented at baseline.    Assessment and Plan: 1. Strep pharyngitis (Primary) - amoxicillin -clavulanate (AUGMENTIN ) 875-125 MG tablet; Take 1 tablet by mouth  2 (two) times daily.  Dispense: 20 tablet; Refill: 0 - ondansetron  (ZOFRAN -ODT) 4 MG disintegrating tablet; Take 1-2 tablets (4-8 mg total) by mouth every 8 (eight) hours as needed.  Dispense: 20 tablet; Refill: 0  - Suspect strep throat - Augmentin  and Zofran  prescribed - Tylenol  and Ibuprofen  alternating every 4 hours - Salt water gargles - Chloraseptic spray - Liquid and soft food diet - Push fluids - New toothbrush in 3 days - Seek in person evaluation if not improving or if symptoms worsen   Follow Up Instructions: I discussed the assessment and treatment plan with the patient. The patient was provided an opportunity to ask questions and all were answered. The patient agreed with the plan and demonstrated an understanding of the instructions.  A copy of instructions were  sent to the patient via MyChart unless otherwise noted below.    The patient was advised to call back or seek an in-person evaluation if the symptoms worsen or if the condition fails to improve as anticipated.    Delon CHRISTELLA Dickinson, PA-C

## 2024-03-31 NOTE — Telephone Encounter (Signed)
 Patient daughter Eliazar) made aware.

## 2024-04-01 ENCOUNTER — Encounter: Payer: Self-pay | Admitting: Family Medicine

## 2024-04-01 ENCOUNTER — Ambulatory Visit (INDEPENDENT_AMBULATORY_CARE_PROVIDER_SITE_OTHER): Admitting: Family Medicine

## 2024-04-01 VITALS — BP 110/58 | HR 78 | Temp 97.8°F | Ht 61.0 in | Wt 212.0 lb

## 2024-04-01 DIAGNOSIS — E1142 Type 2 diabetes mellitus with diabetic polyneuropathy: Secondary | ICD-10-CM | POA: Diagnosis not present

## 2024-04-01 DIAGNOSIS — I152 Hypertension secondary to endocrine disorders: Secondary | ICD-10-CM | POA: Diagnosis not present

## 2024-04-01 DIAGNOSIS — Z6841 Body Mass Index (BMI) 40.0 and over, adult: Secondary | ICD-10-CM

## 2024-04-01 DIAGNOSIS — J410 Simple chronic bronchitis: Secondary | ICD-10-CM | POA: Diagnosis not present

## 2024-04-01 DIAGNOSIS — E782 Mixed hyperlipidemia: Secondary | ICD-10-CM | POA: Diagnosis not present

## 2024-04-01 DIAGNOSIS — E1159 Type 2 diabetes mellitus with other circulatory complications: Secondary | ICD-10-CM

## 2024-04-01 DIAGNOSIS — I10 Essential (primary) hypertension: Secondary | ICD-10-CM | POA: Diagnosis not present

## 2024-04-01 DIAGNOSIS — J020 Streptococcal pharyngitis: Secondary | ICD-10-CM | POA: Diagnosis not present

## 2024-04-01 DIAGNOSIS — E66813 Obesity, class 3: Secondary | ICD-10-CM

## 2024-04-01 MED ORDER — SPIRONOLACTONE 25 MG PO TABS
25.0000 mg | ORAL_TABLET | Freq: Every day | ORAL | 1 refills | Status: AC
Start: 2024-04-01 — End: ?

## 2024-04-01 MED ORDER — PROMETHAZINE HCL 25 MG PO TABS: 25.0000 mg | ORAL_TABLET | Freq: Three times a day (TID) | ORAL | 0 refills | Status: DC | PRN

## 2024-04-01 NOTE — Assessment & Plan Note (Signed)
 Well controlled.  No changes to medicines. Pravastatin 40 mg daily and zetia 10 mg daily. Continue to work on eating a healthy diet and exercise.  Labs drawn today.

## 2024-04-01 NOTE — Progress Notes (Signed)
 Subjective:  Patient ID: Brandi Velazquez, female    DOB: [birth year]  Age: 63 y.o. MRN: [id number]  Chief Complaint  Patient presents with   Medical Management of Chronic Issues    Discussed the use of AI scribe software for clinical note transcription with the patient, who gave verbal consent to proceed.  History of Present Illness   Brandi Velazquez is a 63 year old female with diabetes who presents with nausea and vomiting.Nausea and vomiting- Nausea and vomiting present, associated with recent strep throat and antibiotic use- Vomiting occurs after first dose of Zofran ; second dose taken 30-60 minutes later is tolerated- Vomitus described as 'gobs of phlegm'- Decreased oral intake due to symptoms. Recent streptococcal pharyngitis- Recent diagnosis of strep throat- Currently taking antibiotics- Impaired ability to eat secondary to pharyngitis  Diabetes:  Complications:neuropathy Glucose checking: dexcom G7. 75% in range, 22% high, 2 % very high. Average sugar is 149. Glucose logs: nausea/a Hypoglycemia: no symptoms Most recent A1C: 7.5 Current medications: Farxiga  10 mg daily, Tresiba  52 units daily, metformin  1000 mg 1 tablet twice daily, Ozempic  2 mg weekly Last Eye Exam: utd Foot checks: done.   Diet: eating healthy  Exercise: Running after her grandkids    Hyperlipidemia: Current medications: Pravastatin  40 mg daily, zetia  10 mg before bed.  Hypertension: Complications: Current medications: Ramipril  2.5 mg daily, and spironolactone  25 mg daily  COPD: not using inhalers. Does not regularly use inhalers such as Breztri  or albuterol  unless experiencing severe symptoms     05/29/2023    9:32 AM 02/08/2023    9:38 AM 02/05/2022    3:16 PM 07/18/2021   11:21 AM 07/18/2020   10:09 AM  Depression screen PHQ 2/9  Decreased Interest 0 0 0 0 0  Down, Depressed, Hopeless 0 0 0 0 0  PHQ - 2 Score 0 0 0 0 0  Altered sleeping 0 0     Tired, decreased energy 1 0     Change in  appetite 1 1     Feeling bad or failure about yourself  0 0     Trouble concentrating 0 0     Moving slowly or fidgety/restless 0 0     Suicidal thoughts 0 0     PHQ-9 Score 2 1     Difficult doing work/chores Not difficult at all Not difficult at all           05/29/2023    9:31 AM  Fall Risk   Falls in the past year? 1  Number falls in past yr: 1  Injury with Fall? 1  Risk for fall due to : No Fall Risks  Follow up Falls evaluation completed;Falls prevention discussed    Patient Care Team: Sherre Clapper, MD as PCP - General (Family Medicine) Kennedy, Nathan K, [facility] (Inactive) (Pharmacist)   Review of Systems  Constitutional:  Negative for appetite change, fatigue and fever.  HENT:  Positive for sore throat (Patient is being treated for Strep throat). Negative for congestion, ear pain and sinus pressure.   Respiratory:  Negative for cough, chest tightness, shortness of breath and wheezing.   Cardiovascular:  Negative for chest pain and palpitations.  Gastrointestinal:  Negative for abdominal pain, constipation, diarrhea, nausea and vomiting.  Genitourinary:  Negative for dysuria and hematuria.  Musculoskeletal:  Negative for arthralgias, back pain, joint swelling and myalgias.  Skin:  Negative for rash.  Neurological:  Negative for dizziness, weakness and headaches.  Psychiatric/Behavioral:  Negative for dysphoric  mood. The patient is not nervous/anxious.     Current Outpatient Medications on File Prior to Visit  Medication Sig Dispense Refill   amoxicillin -clavulanate (AUGMENTIN ) 875-125 MG tablet Take 1 tablet by mouth 2 (two) times daily. 20 tablet 0   Blood Glucose Monitoring Suppl DEVI 1 each by Does not apply route in the morning, at noon, and at bedtime. May substitute to any manufacturer covered by patient's insurance. 1 each 0   Budeson-Glycopyrrol-Formoterol (BREZTRI  AEROSPHERE) 160-9-4.8 MCG/ACT AERO Inhale 2 puffs into the lungs in the morning and at bedtime. 10.7 g  2   Continuous Glucose Sensor (DEXCOM G7 SENSOR) MISC Change every 10 days 10 each 3   dapagliflozin  propanediol (FARXIGA ) 10 MG TABS tablet Take 1 tablet (10 mg total) by mouth daily. 90 tablet 3   ezetimibe  (ZETIA ) 10 MG tablet Take 1 tablet by mouth once daily 90 tablet 0   fluticasone  (FLONASE ) 50 MCG/ACT nasal spray USE 2 SPRAY(S) IN EACH NOSTRIL ONCE DAILY AS DIRECTED 16 g 0   Glucose Blood (BLOOD GLUCOSE TEST STRIPS) STRP 1 each by In Vitro route daily. May substitute to any manufacturer covered by patient's insurance. 100 strip 3   insulin degludec  (TRESIBA  FLEXTOUCH) 200 UNIT/ML FlexTouch Pen Inject 46 Units into the skin daily. (Patient taking differently: Inject 52 Units into the skin daily.) 108 mL 0   Insulin Pen Needle (NOVOFINE PEN NEEDLE) 32G X 6 MM MISC Use new needle after every injecion 100 each 2   metFORMIN  (GLUCOPHAGE ) 1000 MG tablet Take 1 tablet by mouth twice daily with food 180 tablet 0   montelukast  (SINGULAIR ) 10 MG tablet TAKE 1 TABLET BY MOUTH AT BEDTIME 90 tablet 3   Multiple Vitamin (MULTIVITAMIN) tablet Take 1 tablet by mouth daily.     pravastatin  (PRAVACHOL ) 40 MG tablet TAKE 1 TABLET BY MOUTH ONCE DAILY AT BEDTIME 90 tablet 0   ramipril  (ALTACE ) 2.5 MG capsule Take 1 capsule by mouth in the morning 90 capsule 0   Semaglutide , 2 MG/DOSE, (OZEMPIC , 2 MG/DOSE,) 8 MG/3ML SOPN INJECT 2MG  UNDER THE SKIN ONE TIME WEEKLY 9 mL 1   No current facility-administered medications on file prior to visit.   Past Medical History:  Diagnosis Date   Ankle pain    Back pain of lumbar region with sciatica 01/07/2020   Chronic pain    back   COPD (chronic obstructive pulmonary disease) (HCC)    Depression    Diabetes mellitus    GERD (gastroesophageal reflux disease)    Hyperlipidemia    Hypertension    Low back pain    Obstructive sleep apnea    Pneumonia 2010   Post-menopausal atrophic vaginitis    Superficial phlebitis    Past Surgical History:  Procedure  Laterality Date   BACK SURGERY  1990   CESAREAN SECTION     CYST REMOVAL HAND     inner thigh   REFRACTIVE SURGERY Right 06/07/2022   SPINE SURGERY     lumbar laminectomy and diskectomy    Family History  Problem Relation Age of Onset   Diabetes Mother    Heart disease Mother    Cancer Father    Diabetes Sister    Stroke Sister    Lung disease Neg Hx    Social History   Socioeconomic History   Marital status: Single    Spouse name: Not on file   Number of children: Not on file   Years of education: Not on file  Highest education level: Not on file  Occupational History   Not on file  Tobacco Use   Smoking status: Former    Current packs/day: 0.00    Average packs/day: 1.5 packs/day for 15.0 years (22.5 ttl pk-yrs)    Types: Cigarettes    Start date: 03/20/1994    Quit date: 03/20/2009    Years since quitting: 15.0   Smokeless tobacco: Never  Vaping Use   Vaping status: Never Used  Substance and Sexual Activity   Alcohol use: No   Drug use: No   Sexual activity: Not Currently    Partners: Male  Other Topics Concern   Not on file  Social History Narrative   Not on file   Social Drivers of Health   Financial Resource Strain: High Risk (08/15/2022)   Overall Financial Resource Strain (CARDIA)    Difficulty of Paying Living Expenses: Hard  Food Insecurity: No Food Insecurity (12/04/2023)   Hunger Vital Sign    Worried About Running Out of Food in the Last Year: Never true    Ran Out of Food in the Last Year: Never true  Transportation Needs: No Transportation Needs (12/04/2023)   PRAPARE - Administrator, Civil Service (Medical): No    Lack of Transportation (Non-Medical): No  Physical Activity: Inactive (05/29/2023)   Exercise Vital Sign    Days of Exercise per Week: 0 days    Minutes of Exercise per Session: 0 min  Stress: No Stress Concern Present (05/29/2023)   [employer] of Occupational Health - Occupational Stress Questionnaire     Feeling of Stress : Not at all  Social Connections: Moderately Isolated (05/29/2023)   Social Connection and Isolation Panel    Frequency of Communication with Friends and Family: More than three times a week    Frequency of Social Gatherings with Friends and Family: More than three times a week    Attends Religious Services: More than 4 times per year    Active Member of [employer] or Organizations: No    Attends Engineer, structural: Not on file    Marital Status: Divorced    Objective:  BP (!) 110/58 (BP Location: Left Arm, Patient Position: Sitting)   Pulse 78   Temp 97.8 F (36.6 C) (Temporal)   Ht 5' 1 (1.549 m)   Wt 212 lb (96.2 kg)   SpO2 98%   BMI 40.06 kg/m      04/01/2024    9:39 AM 12/04/2023    8:34 AM 10/02/2023   10:50 AM  BP/Weight  Systolic BP 110 112 132  Diastolic BP 58 66 62  Wt. (Lbs) 212 213 204.4  BMI 40.06 kg/m2 40.25 kg/m2 38.62 kg/m2    Physical Exam Vitals reviewed.  Constitutional:      Appearance: Normal appearance. She is normal weight.  Neck:     Vascular: No carotid bruit.  Cardiovascular:     Rate and Rhythm: Normal rate and regular rhythm.     Heart sounds: Normal heart sounds.  Pulmonary:     Effort: Pulmonary effort is normal. No respiratory distress.     Breath sounds: Normal breath sounds.  Abdominal:     General: Abdomen is flat. Bowel sounds are normal.     Palpations: Abdomen is soft.     Tenderness: There is no abdominal tenderness.  Neurological:     Mental Status: She is alert and oriented to person, place, and time.  Psychiatric:  Mood and Affect: Mood normal.        Behavior: Behavior normal.    Diabetic Foot Exam - Simple   Simple Foot Form  04/01/2024  4:30 PM  Visual Inspection No deformities, no ulcerations, no other skin breakdown bilaterally: Yes Sensation Testing Intact to touch and monofilament testing bilaterally: Yes Pulse Check Posterior Tibialis and Dorsalis pulse intact bilaterally:  Yes Comments      Lab Results  Component Value Date   WBC 9.7 04/01/2024   HGB 14.5 04/01/2024   HCT 45.0 04/01/2024   PLT 308 04/01/2024   GLUCOSE 119 (H) 04/01/2024   CHOL 115 04/01/2024   TRIG 82 04/01/2024   HDL 45 04/01/2024   LDLCALC 54 04/01/2024   ALT 21 04/01/2024   AST 17 04/01/2024   NA 137 04/01/2024   K 4.1 04/01/2024   CL 100 04/01/2024   CREATININE 0.72 04/01/2024   BUN 18 04/01/2024   CO2 20 04/01/2024   TSH 1.860 09/03/2023   HGBA1C 7.2 (H) 04/01/2024   MICROALBUR 30 05/18/2021      Assessment & Plan:  Strep pharyngitis Assessment & Plan: No exudates. Nausea likely due to medication intolerance of zofran .  - Continue augmentin .  - Order blood work. - Prescribe Phenergan  for nausea, advise caution due to sedative effects.  Orders: -     Promethazine  HCl; Take 1 tablet (25 mg total) by mouth every 8 (eight) hours as needed for nausea or vomiting.  Dispense: 20 tablet; Refill: 0  Diabetic polyneuropathy associated with type 2 diabetes mellitus (HCC) Assessment & Plan: Control: good Benefits from use of dexcom G7.  Recommend check feet daily. Recommend annual eye exams. Medicines: Continue Farxiga  10 mg daily, Metformin  1000 mg twice daily, Ozempic  2 mg weekly, Tresiba  52 units daily. Continue to work on eating a healthy diet and exercise.  Labs drawn today.    Orders: -     Hemoglobin A1c  Mixed hyperlipidemia Assessment & Plan: Well controlled.  No changes to medicines. Pravastatin  40 mg daily and zetia  10 mg daily. Continue to work on eating a healthy diet and exercise.  Labs drawn today.   Orders: -     Lipid panel  Hypertension associated with diabetes [facility]) Assessment & Plan: Well controlled.  On spironolactone  and ramipril  for blood pressure management.   Orders: -     Comprehensive metabolic panel with GFR -     CBC with Differential/Platelet -     Spironolactone ; Take 1 tablet (25 mg total) by mouth daily.  Dispense: 90  tablet; Refill: 1  Simple chronic bronchitis (HCC) Assessment & Plan: Uses Breztri  and albuterol  during severe episodes. Prefers Breztri . No current respiratory distress   Essential hypertension, benign Assessment & Plan: Well controlled.  On spironolactone  and ramipril  for blood pressure management.    Class 3 severe obesity due to excess calories with serious comorbidity and body mass index (BMI) of 40.0 to 44.9 in adult Assessment & Plan: Recommend continue to work on eating healthy diet and exercise.         Meds ordered this encounter  Medications   spironolactone  (ALDACTONE ) 25 MG tablet    Sig: Take 1 tablet (25 mg total) by mouth daily.    Dispense:  90 tablet    Refill:  1   promethazine  (PHENERGAN ) 25 MG tablet    Sig: Take 1 tablet (25 mg total) by mouth every 8 (eight) hours as needed for nausea or vomiting.    Dispense:  20 tablet    Refill:  0    Orders Placed This Encounter  Procedures   Lipid panel   Hemoglobin A1c   Comprehensive metabolic panel   CBC with Differential/Platelet     Follow-up: Return in about 3 months (around 07/02/2024) for chronic follow up, awv needs scheduled also.SABRA LILLETTE Kato I Leal-Borjas,acting as a scribe for Abigail Free, MD.,have documented all relevant documentation on the behalf of Abigail Free, MD,as directed by  Abigail Free, MD while in the presence of Abigail Free, MD.    An After Visit Summary was printed and given to the patient.  I attest that I have reviewed this visit and agree with the plan scribed by my staff.  Abigail Free, MD [facility] [phone]

## 2024-04-01 NOTE — Assessment & Plan Note (Signed)
 Well controlled.  On spironolactone and ramipril for blood pressure management.

## 2024-04-02 ENCOUNTER — Ambulatory Visit: Payer: Self-pay | Admitting: Family Medicine

## 2024-04-02 LAB — CBC WITH DIFFERENTIAL/PLATELET
Basophils Absolute: 0.1 x10E3/uL (ref 0.0–0.2)
Basos: 1 %
EOS (ABSOLUTE): 0.2 x10E3/uL (ref 0.0–0.4)
Eos: 2 %
Hematocrit: 45 % (ref 34.0–46.6)
Hemoglobin: 14.5 g/dL (ref 11.1–15.9)
Immature Grans (Abs): 0 x10E3/uL (ref 0.0–0.1)
Immature Granulocytes: 0 %
Lymphocytes Absolute: 3.2 x10E3/uL — ABNORMAL HIGH (ref 0.7–3.1)
Lymphs: 33 %
MCH: 28.4 pg (ref 26.6–33.0)
MCHC: 32.2 g/dL (ref 31.5–35.7)
MCV: 88 fL (ref 79–97)
Monocytes Absolute: 0.9 x10E3/uL (ref 0.1–0.9)
Monocytes: 9 %
Neutrophils Absolute: 5.3 x10E3/uL (ref 1.4–7.0)
Neutrophils: 55 %
Platelets: 308 x10E3/uL (ref 150–450)
RBC: 5.1 x10E6/uL (ref 3.77–5.28)
RDW: 13.1 % (ref 11.7–15.4)
WBC: 9.7 x10E3/uL (ref 3.4–10.8)

## 2024-04-02 LAB — COMPREHENSIVE METABOLIC PANEL WITH GFR
ALT: 21 IU/L (ref 0–32)
AST: 17 IU/L (ref 0–40)
Albumin: 4.2 g/dL (ref 3.9–4.9)
Alkaline Phosphatase: 66 IU/L (ref 44–121)
BUN/Creatinine Ratio: 25 (ref 12–28)
BUN: 18 mg/dL (ref 8–27)
Bilirubin Total: 0.5 mg/dL (ref 0.0–1.2)
CO2: 20 mmol/L (ref 20–29)
Calcium: 9.2 mg/dL (ref 8.7–10.3)
Chloride: 100 mmol/L (ref 96–106)
Creatinine, Ser: 0.72 mg/dL (ref 0.57–1.00)
Globulin, Total: 2.9 g/dL (ref 1.5–4.5)
Glucose: 119 mg/dL — ABNORMAL HIGH (ref 70–99)
Potassium: 4.1 mmol/L (ref 3.5–5.2)
Sodium: 137 mmol/L (ref 134–144)
Total Protein: 7.1 g/dL (ref 6.0–8.5)
eGFR: 93 mL/min/1.73 (ref >59)

## 2024-04-02 LAB — LIPID PANEL
Chol/HDL Ratio: 2.6 ratio (ref 0.0–4.4)
Cholesterol, Total: 115 mg/dL (ref 100–199)
HDL: 45 mg/dL (ref >39)
LDL Chol Calc (NIH): 54 mg/dL (ref 0–99)
Triglycerides: 82 mg/dL (ref 0–149)
VLDL Cholesterol Cal: 16 mg/dL (ref 5–40)

## 2024-04-02 LAB — HEMOGLOBIN A1C
Est. average glucose Bld gHb Est-mCnc: 160 mg/dL
Hgb A1c MFr Bld: 7.2 % — ABNORMAL HIGH (ref 4.8–5.6)

## 2024-04-05 DIAGNOSIS — J020 Streptococcal pharyngitis: Secondary | ICD-10-CM | POA: Insufficient documentation

## 2024-04-05 NOTE — Assessment & Plan Note (Addendum)
 No exudates. Nausea likely due to medication intolerance of zofran .  - Continue augmentin .  - Order blood work. - Prescribe Phenergan  for nausea, advise caution due to sedative effects.

## 2024-04-05 NOTE — Assessment & Plan Note (Signed)
 Uses Breztri  and albuterol  during severe episodes. Prefers Breztri . No current respiratory distress

## 2024-04-05 NOTE — Assessment & Plan Note (Addendum)
 Control: good Benefits from use of dexcom G7.  Recommend check feet daily. Recommend annual eye exams. Medicines: Continue Farxiga  10 mg daily, Metformin  1000 mg twice daily, Ozempic  2 mg weekly, Tresiba  52 units daily. Continue to work on eating a healthy diet and exercise.  Labs drawn today.

## 2024-04-05 NOTE — Assessment & Plan Note (Signed)
 Recommend continue to work on eating healthy diet and exercise.

## 2024-04-06 ENCOUNTER — Ambulatory Visit: Payer: Self-pay

## 2024-04-06 ENCOUNTER — Ambulatory Visit: Payer: Self-pay | Admitting: *Deleted

## 2024-04-06 ENCOUNTER — Ambulatory Visit (INDEPENDENT_AMBULATORY_CARE_PROVIDER_SITE_OTHER)

## 2024-04-06 VITALS — BP 118/68 | HR 86 | Temp 97.6°F | Ht 61.0 in | Wt 213.0 lb

## 2024-04-06 DIAGNOSIS — M79642 Pain in left hand: Secondary | ICD-10-CM

## 2024-04-06 DIAGNOSIS — J029 Acute pharyngitis, unspecified: Secondary | ICD-10-CM | POA: Diagnosis not present

## 2024-04-06 DIAGNOSIS — R2232 Localized swelling, mass and lump, left upper limb: Secondary | ICD-10-CM | POA: Diagnosis not present

## 2024-04-06 DIAGNOSIS — M79641 Pain in right hand: Secondary | ICD-10-CM | POA: Diagnosis not present

## 2024-04-06 LAB — CBC WITH DIFFERENTIAL/PLATELET
Basophils Absolute: 0.1 x10E3/uL (ref 0.0–0.2)
Basos: 0 %
EOS (ABSOLUTE): 0.2 x10E3/uL (ref 0.0–0.4)
Eos: 2 %
Hematocrit: 46.4 % (ref 34.0–46.6)
Hemoglobin: 15 g/dL (ref 11.1–15.9)
Immature Grans (Abs): 0.2 x10E3/uL — ABNORMAL HIGH (ref 0.0–0.1)
Immature Granulocytes: 1 %
Lymphocytes Absolute: 3.5 x10E3/uL — ABNORMAL HIGH (ref 0.7–3.1)
Lymphs: 25 %
MCH: 28.4 pg (ref 26.6–33.0)
MCHC: 32.3 g/dL (ref 31.5–35.7)
MCV: 88 fL (ref 79–97)
Monocytes Absolute: 1.1 x10E3/uL — ABNORMAL HIGH (ref 0.1–0.9)
Monocytes: 8 %
Neutrophils Absolute: 8.9 x10E3/uL — ABNORMAL HIGH (ref 1.4–7.0)
Neutrophils: 64 %
Platelets: 332 x10E3/uL (ref 150–450)
RBC: 5.29 x10E6/uL — ABNORMAL HIGH (ref 3.77–5.28)
RDW: 13.1 % (ref 11.7–15.4)
WBC: 13.9 x10E3/uL — ABNORMAL HIGH (ref 3.4–10.8)

## 2024-04-06 LAB — SEDIMENTATION RATE: Sed Rate: 74 mm/h — ABNORMAL HIGH (ref 0–40)

## 2024-04-06 LAB — URIC ACID: Uric Acid: 4.3 mg/dL (ref 3.0–7.2)

## 2024-04-06 LAB — C-REACTIVE PROTEIN: CRP: 48 mg/L — ABNORMAL HIGH (ref 0–10)

## 2024-04-06 LAB — POCT RAPID STREP A (OFFICE): Rapid Strep A Screen: NEGATIVE

## 2024-04-06 MED ORDER — DOXYCYCLINE HYCLATE 100 MG PO TABS: 100.0000 mg | ORAL_TABLET | Freq: Two times a day (BID) | ORAL | 0 refills | Status: AC

## 2024-04-06 MED ORDER — TRAMADOL HCL 50 MG PO TABS: 50.0000 mg | ORAL_TABLET | Freq: Two times a day (BID) | ORAL | 0 refills | Status: AC | PRN

## 2024-04-06 NOTE — Progress Notes (Signed)
 Acute Office Visit  Subjective:    Patient ID: Brandi Velazquez, female    DOB: 27-Jan-1961, 63 y.o.   MRN: [id number]  Chief Complaint  Patient presents with   swelling in left hand    HPI: Discussed the use of AI scribe software for clinical note transcription with the patient, who gave verbal consent to proceed.  History of Present Illness   Discussed the use of AI scribe software for clinical note transcription with the patient, who gave verbal consent to proceed.  History of Present Illness   Brandi Velazquez is a 63 year old female with diabetes who presents with left hand swelling and pain after recent antibiotic use.  Left hand swelling and pain - Onset of swelling and pain in the left hand on July 18th, described as 'like a big round football with sausages sticking out' - Swelling primarily in the left hand; right hand has pain but no swelling - Significant pain in the left hand, constant in nature, interfering with ability to bend the hand and disrupting sleep - Swelling decreased after discontinuing Augmentin  on July 19th - Heat application provided some relief - Ibuprofen  used for pain with partial relief  Recent antibiotic use and adverse reaction - Diagnosed with strep throat on July 14th after exposure to daughter with same condition VIA TELEHEALTH VISIT, NOT EXAMINED.  - Started Augmentin  on July 15th for strep throat - Throat symptoms have resolved; no current sore throat - Swelling and pain in the left hand developed after starting Augmentin , improved after discontinuation on July 19th, SHE DISCONTINUED AUGMENTIN  ONLY AFTER TAKING FOR 4 DAYS.  Diabetes mellitus - Recent diabetes checkup on July 16th - Hemoglobin A1c of 7.2 - Normal white blood cell count and electrolytes on recent blood work  Chronic pain and medication allergies - History of back pain, previously treated with tramadol  without significant effect - Allergies to morphine, Bactrim, and possibly  codeine (uncertain about codeine)        Past Medical History:  Diagnosis Date   Ankle pain    Back pain of lumbar region with sciatica 01/07/2020   Chronic pain    back   COPD (chronic obstructive pulmonary disease) (HCC)    Depression    Diabetes mellitus    GERD (gastroesophageal reflux disease)    Hyperlipidemia    Hypertension    Low back pain    Obstructive sleep apnea    Pneumonia 2010   Post-menopausal atrophic vaginitis    Superficial phlebitis     Past Surgical History:  Procedure Laterality Date   BACK SURGERY  1990   CESAREAN SECTION     CYST REMOVAL HAND     inner thigh   REFRACTIVE SURGERY Right 06/07/2022   SPINE SURGERY     lumbar laminectomy and diskectomy    Family History  Problem Relation Age of Onset   Diabetes Mother    Heart disease Mother    Cancer Father    Diabetes Sister    Stroke Sister    Lung disease Neg Hx     Social History   Socioeconomic History   Marital status: Single    Spouse name: Not on file   Number of children: Not on file   Years of education: Not on file   Highest education level: Not on file  Occupational History   Not on file  Tobacco Use   Smoking status: Former    Current packs/day: 0.00    Average  packs/day: 1.5 packs/day for 15.0 years (22.5 ttl pk-yrs)    Types: Cigarettes    Start date: 03/20/1994    Quit date: 03/20/2009    Years since quitting: 15.0   Smokeless tobacco: Never  Vaping Use   Vaping status: Never Used  Substance and Sexual Activity   Alcohol use: No   Drug use: No   Sexual activity: Not Currently    Partners: Male  Other Topics Concern   Not on file  Social History Narrative   Not on file   Social Drivers of Health   Financial Resource Strain: High Risk (08/15/2022)   Overall Financial Resource Strain (CARDIA)    Difficulty of Paying Living Expenses: Hard  Food Insecurity: No Food Insecurity (12/04/2023)   Hunger Vital Sign    Worried About Running Out of Food in the Last  Year: Never true    Ran Out of Food in the Last Year: Never true  Transportation Needs: No Transportation Needs (12/04/2023)   PRAPARE - Administrator, Civil Service (Medical): No    Lack of Transportation (Non-Medical): No  Physical Activity: Inactive (05/29/2023)   Exercise Vital Sign    Days of Exercise per Week: 0 days    Minutes of Exercise per Session: 0 min  Stress: No Stress Concern Present (05/29/2023)   [employer] of Occupational Health - Occupational Stress Questionnaire    Feeling of Stress : Not at all  Social Connections: Moderately Isolated (05/29/2023)   Social Connection and Isolation Panel    Frequency of Communication with Friends and Family: More than three times a week    Frequency of Social Gatherings with Friends and Family: More than three times a week    Attends Religious Services: More than 4 times per year    Active Member of [employer] or Organizations: No    Attends Engineer, structural: Not on file    Marital Status: Divorced  Intimate Partner Violence: Not At Risk (12/04/2023)   Humiliation, Afraid, Rape, and Kick questionnaire    Fear of Current or Ex-Partner: No    Emotionally Abused: No    Physically Abused: No    Sexually Abused: No    Outpatient Medications Prior to Visit  Medication Sig Dispense Refill   Blood Glucose Monitoring Suppl DEVI 1 each by Does not apply route in the morning, at noon, and at bedtime. May substitute to any manufacturer covered by patient's insurance. 1 each 0   Budeson-Glycopyrrol-Formoterol (BREZTRI  AEROSPHERE) 160-9-4.8 MCG/ACT AERO Inhale 2 puffs into the lungs in the morning and at bedtime. 10.7 g 2   Continuous Glucose Sensor (DEXCOM G7 SENSOR) MISC Change every 10 days 10 each 3   dapagliflozin  propanediol (FARXIGA ) 10 MG TABS tablet Take 1 tablet (10 mg total) by mouth daily. 90 tablet 3   ezetimibe  (ZETIA ) 10 MG tablet Take 1 tablet by mouth once daily 90 tablet 0   fluticasone  (FLONASE ) 50  MCG/ACT nasal spray USE 2 SPRAY(S) IN EACH NOSTRIL ONCE DAILY AS DIRECTED 16 g 0   Glucose Blood (BLOOD GLUCOSE TEST STRIPS) STRP 1 each by In Vitro route daily. May substitute to any manufacturer covered by patient's insurance. 100 strip 3   insulin degludec  (TRESIBA  FLEXTOUCH) 200 UNIT/ML FlexTouch Pen Inject 46 Units into the skin daily. (Patient taking differently: Inject 52 Units into the skin daily.) 108 mL 0   Insulin Pen Needle (NOVOFINE PEN NEEDLE) 32G X 6 MM MISC Use new needle after every injecion 100  each 2   metFORMIN  (GLUCOPHAGE ) 1000 MG tablet Take 1 tablet by mouth twice daily with food 180 tablet 0   montelukast  (SINGULAIR ) 10 MG tablet TAKE 1 TABLET BY MOUTH AT BEDTIME 90 tablet 3   Multiple Vitamin (MULTIVITAMIN) tablet Take 1 tablet by mouth daily.     pravastatin  (PRAVACHOL ) 40 MG tablet TAKE 1 TABLET BY MOUTH ONCE DAILY AT BEDTIME 90 tablet 0   promethazine  (PHENERGAN ) 25 MG tablet Take 1 tablet (25 mg total) by mouth every 8 (eight) hours as needed for nausea or vomiting. 20 tablet 0   ramipril  (ALTACE ) 2.5 MG capsule Take 1 capsule by mouth in the morning 90 capsule 0   Semaglutide , 2 MG/DOSE, (OZEMPIC , 2 MG/DOSE,) 8 MG/3ML SOPN INJECT 2MG  UNDER THE SKIN ONE TIME WEEKLY 9 mL 1   spironolactone  (ALDACTONE ) 25 MG tablet Take 1 tablet (25 mg total) by mouth daily. 90 tablet 1   amoxicillin -clavulanate (AUGMENTIN ) 875-125 MG tablet Take 1 tablet by mouth 2 (two) times daily. (Patient not taking: Reported on 04/06/2024) 20 tablet 0   No facility-administered medications prior to visit.    Allergies  Allergen Reactions   Bactrim [Sulfamethoxazole-Trimethoprim] Swelling   Morphine And Codeine    Zofran  [Ondansetron ] Nausea And Vomiting    Review of Systems  Constitutional:  Negative for appetite change, fatigue and fever.  HENT:  Negative for congestion, ear pain, sinus pressure and sore throat.   Respiratory:  Negative for cough, chest tightness, shortness of breath and  wheezing.   Cardiovascular:  Negative for chest pain and palpitations.  Gastrointestinal:  Negative for abdominal pain, constipation, diarrhea, nausea and vomiting.  Genitourinary:  Negative for dysuria and hematuria.  Musculoskeletal:  Positive for joint swelling (left hand swelling/redness). Negative for arthralgias, back pain and myalgias.  Skin:  Negative for rash.  Neurological:  Negative for dizziness, weakness and headaches.  Psychiatric/Behavioral:  Negative for dysphoric mood. The patient is not nervous/anxious.        Objective:        04/06/2024   10:15 AM 04/01/2024    9:39 AM 12/04/2023    8:34 AM  Vitals with BMI  Height 5' 1 5' 1 5' 1  Weight 213 lbs 212 lbs 213 lbs  BMI 40.27 40.08 40.27  Systolic 118 110 887  Diastolic 68 58 66  Pulse 86 78 78    Orthostatic VS for the past 72 hrs (Last 3 readings):  Patient Position BP Location  04/06/24 1015 Sitting Right Arm     Physical Exam Vitals and nursing note reviewed.  Constitutional:      Appearance: She is obese.  HENT:     Head: Normocephalic and atraumatic.  Cardiovascular:     Rate and Rhythm: Normal rate and regular rhythm.  Pulmonary:     Effort: Pulmonary effort is normal.     Breath sounds: Normal breath sounds.  Musculoskeletal:        General: Swelling (swelling, erythema and warmth of the left hand, dorsum) present.  Neurological:     General: No focal deficit present.     Mental Status: She is alert.  Psychiatric:        Mood and Affect: Mood normal.     Health Maintenance Due  Topic Date Due   Zoster Vaccines- Shingrix (1 of 2) Never done   Pneumococcal Vaccine 56-27 Years old (2 of 2 - PCV) 10/08/2012   COVID-19 Vaccine (6 - Moderna risk 2024-25 season) 03/03/2024   Cervical Cancer  Screening (HPV/Pap Cotest)  06/06/2024    There are no preventive care reminders to display for this patient.   Lab Results  Component Value Date   TSH 1.860 09/03/2023   Lab Results  Component  Value Date   WBC 9.7 04/01/2024   HGB 14.5 04/01/2024   HCT 45.0 04/01/2024   MCV 88 04/01/2024   PLT 308 04/01/2024   Lab Results  Component Value Date   NA 137 04/01/2024   K 4.1 04/01/2024   CO2 20 04/01/2024   GLUCOSE 119 (H) 04/01/2024   BUN 18 04/01/2024   CREATININE 0.72 04/01/2024   BILITOT 0.5 04/01/2024   ALKPHOS 66 04/01/2024   AST 17 04/01/2024   ALT 21 04/01/2024   PROT 7.1 04/01/2024   ALBUMIN 4.2 04/01/2024   CALCIUM 9.2 04/01/2024   EGFR 93 04/01/2024   Lab Results  Component Value Date   CHOL 115 04/01/2024   Lab Results  Component Value Date   HDL 45 04/01/2024   Lab Results  Component Value Date   LDLCALC 54 04/01/2024   Lab Results  Component Value Date   TRIG 82 04/01/2024   Lab Results  Component Value Date   CHOLHDL 2.6 04/01/2024   Lab Results  Component Value Date   HGBA1C 7.2 (H) 04/01/2024       Assessment & Plan:  Localized swelling on left hand Assessment & Plan: Cellulitis of the left hand, characterized by swelling, redness, and pain, began on April 03, 2024. Swelling has improved since discontinuing the previous antibiotic. The right hand also experiences pain without swelling.   Differential includes systemic infection due to streptococcal infection, but strep test was negative. Concern for potential systemic issue due to bilateral hand symptoms. - Prescribe doxycycline  100 mg twice daily for 7 days due to its effectiveness against skin infections, noting sun sensitivity as a side effect - Order complete blood count (CBC) and markers of inflammation to rule out systemic infection - Advise elevation of the left hand and application of ice to reduce swelling - Prescribe tramadol  for pain management, to be taken at night if needed, sent 10 tablets.  - Instruct to monitor for worsening symptoms or spread of redness, which may require IV antibiotics  Orders: -     C-reactive protein -     CBC with Differential/Platelet -      Sedimentation rate -     Uric acid  Bilateral hand pain -     C-reactive protein -     CBC with Differential/Platelet -     Sedimentation rate -     Uric acid  Sore throat Assessment & Plan: Negative strep test today. No symptoms  Orders: -     C-reactive protein -     CBC with Differential/Platelet -     Sedimentation rate -     POCT rapid strep A  Other orders -     Doxycycline  Hyclate; Take 1 tablet (100 mg total) by mouth 2 (two) times daily for 7 days.  Dispense: 14 tablet; Refill: 0 -     traMADol  HCl; Take 1 tablet (50 mg total) by mouth every 12 (twelve) hours as needed for up to 5 days.  Dispense: 10 tablet; Refill: 0    Assessment and Plan           Meds ordered this encounter  Medications   doxycycline  (VIBRA -TABS) 100 MG tablet    Sig: Take 1 tablet (100 mg total) by mouth 2 (  two) times daily for 7 days.    Dispense:  14 tablet    Refill:  0   traMADol  (ULTRAM ) 50 MG tablet    Sig: Take 1 tablet (50 mg total) by mouth every 12 (twelve) hours as needed for up to 5 days.    Dispense:  10 tablet    Refill:  0    Orders Placed This Encounter  Procedures   C-reactive protein   CBC with Differential/Platelet   Sedimentation rate   Uric Acid   POCT rapid strep A     Follow-up: No follow-ups on file.  An After Visit Summary was printed and given to the patient.    Daniah Zaldivar, MD [name] [phone]

## 2024-04-06 NOTE — Assessment & Plan Note (Addendum)
 Cellulitis of the left hand, characterized by swelling, redness, and pain, began on April 03, 2024. Swelling has improved since discontinuing the previous antibiotic. The right hand also experiences pain without swelling.   Differential includes systemic infection due to streptococcal infection, but strep test was negative. Concern for potential systemic issue due to bilateral hand symptoms. - Prescribe doxycycline  100 mg twice daily for 7 days due to its effectiveness against skin infections, noting sun sensitivity as a side effect - Order complete blood count (CBC) and markers of inflammation to rule out systemic infection - Advise elevation of the left hand and application of ice to reduce swelling - Prescribe tramadol  for pain management, to be taken at night if needed, sent 10 tablets.  - Instruct to monitor for worsening symptoms or spread of redness, which may require IV antibiotics

## 2024-04-06 NOTE — Assessment & Plan Note (Signed)
 Negative strep test today. No symptoms

## 2024-04-06 NOTE — Telephone Encounter (Signed)
 FYI Only or Action Required?: Action required by provider: request for appointment, clinical question for provider, and update on patient condition.  Patient was last seen in primary care on 04/01/2024 by Brandi Clapper, MD.  Called Nurse Triage reporting Arm Swelling.  Symptoms began several days ago.  Interventions attempted: Prescription medications: augmentin  last taken on Saturday after hand swelling occured.  Symptoms are: rapidly worsening.  Triage Disposition: Go to ED Now (or PCP Triage)  Patient/caregiver understands and will follow disposition?: No, wishes to speak with PCP                Copied from CRM [id number]. Topic: Clinical - Red Word Triage >> Apr 06, 2024  8:08 AM Carlatta H wrote: Red Word that prompted transfer to Nurse Triage: Patient started taking antibiotics on 7/14 and her hands are now swollen and hurting//Left hand is swollen and she can move it at all// Medication is amoxicillin -clavulanate (AUGMENTIN ) 875-125 MG tablet [[id number]] Reason for Disposition  [1] Swelling is painful to touch AND [2] fever  Answer Assessment - Initial Assessment Questions Recommended ED now . Patient would like to see PCP instead please advise. Called [facility] to notify.    1. ONSET: When did the swelling start? (e.g., minutes, hours, days)     Swelling since Friday  2. LOCATION: What part of the hand is swollen?  Are both hands swollen or just one hand?     Bilateral hand swelling left hand worse up to wrist  3. TYPE : What does it look like? (e.g., ball, lump; localized; hand swelling)     Foot ball and fingers like sausages' 4. SWELLING SEVERITY: If more than a lump or localized, ask: How bad is the hand swelling? (e.g., mild, moderate, severe; describe)     Reports as severe on left hand  5. REDNESS: Is there redness or signs of infection?     Yes redness and warm to touch 6. PAIN: Is the swelling painful to touch? If Yes, ask: How painful is  it?   (Scale 1-10; mild, moderate or severe)     Severe to touch 7. FEVER: Do you have a fever? If Yes, ask: What is it, how was it measured, and when did it start?      unknown 8. CAUSE: What do you think is causing the hand swelling? (e.g., heat, insect bite, pregnancy, recent injury)     Started augmentin  last week and hand swelling started Friday  9. MEDICAL HISTORY: Do you have a history of heart failure, kidney disease, liver failure, or cancer?     na 10. RECURRENT SYMPTOM: Have you had hand swelling before? If Yes, ask: When was the last time? What happened that time?       No  11. OTHER SYMPTOMS: Do you have any other symptoms? (e.g., blurred vision, difficulty breathing, headache)       Pain right hand, left hand swollen like foot ball, severe pain constant ache worse in evening fingers swelling red and feels like fever, warm to touch, hand not make fist 12. PREGNANCY: Is there any chance you are pregnant? When was your last menstrual period?       na  Protocols used: Hand Swelling-A-AH

## 2024-04-06 NOTE — Telephone Encounter (Signed)
 Appointment made with Dr Sirivol @ 10am

## 2024-04-08 ENCOUNTER — Ambulatory Visit: Payer: Self-pay

## 2024-04-08 ENCOUNTER — Other Ambulatory Visit: Payer: Self-pay

## 2024-04-08 ENCOUNTER — Ambulatory Visit: Payer: Self-pay | Admitting: *Deleted

## 2024-04-08 DIAGNOSIS — Z79899 Other long term (current) drug therapy: Secondary | ICD-10-CM | POA: Diagnosis not present

## 2024-04-08 DIAGNOSIS — E119 Type 2 diabetes mellitus without complications: Secondary | ICD-10-CM | POA: Diagnosis not present

## 2024-04-08 DIAGNOSIS — E785 Hyperlipidemia, unspecified: Secondary | ICD-10-CM | POA: Diagnosis not present

## 2024-04-08 DIAGNOSIS — Z885 Allergy status to narcotic agent status: Secondary | ICD-10-CM | POA: Diagnosis not present

## 2024-04-08 DIAGNOSIS — L03113 Cellulitis of right upper limb: Secondary | ICD-10-CM | POA: Diagnosis not present

## 2024-04-08 DIAGNOSIS — M7989 Other specified soft tissue disorders: Secondary | ICD-10-CM | POA: Diagnosis not present

## 2024-04-08 DIAGNOSIS — J449 Chronic obstructive pulmonary disease, unspecified: Secondary | ICD-10-CM | POA: Diagnosis not present

## 2024-04-08 DIAGNOSIS — Z794 Long term (current) use of insulin: Secondary | ICD-10-CM | POA: Diagnosis not present

## 2024-04-08 DIAGNOSIS — Z7985 Long-term (current) use of injectable non-insulin antidiabetic drugs: Secondary | ICD-10-CM | POA: Diagnosis not present

## 2024-04-08 DIAGNOSIS — Z7982 Long term (current) use of aspirin: Secondary | ICD-10-CM | POA: Diagnosis not present

## 2024-04-08 DIAGNOSIS — M25531 Pain in right wrist: Secondary | ICD-10-CM | POA: Diagnosis not present

## 2024-04-08 DIAGNOSIS — I10 Essential (primary) hypertension: Secondary | ICD-10-CM | POA: Diagnosis not present

## 2024-04-08 DIAGNOSIS — L03114 Cellulitis of left upper limb: Secondary | ICD-10-CM | POA: Diagnosis not present

## 2024-04-08 DIAGNOSIS — Z7951 Long term (current) use of inhaled steroids: Secondary | ICD-10-CM | POA: Diagnosis not present

## 2024-04-08 DIAGNOSIS — Z7984 Long term (current) use of oral hypoglycemic drugs: Secondary | ICD-10-CM | POA: Diagnosis not present

## 2024-04-08 DIAGNOSIS — R112 Nausea with vomiting, unspecified: Secondary | ICD-10-CM | POA: Diagnosis not present

## 2024-04-08 DIAGNOSIS — D72829 Elevated white blood cell count, unspecified: Secondary | ICD-10-CM | POA: Diagnosis not present

## 2024-04-08 DIAGNOSIS — Z882 Allergy status to sulfonamides status: Secondary | ICD-10-CM | POA: Diagnosis not present

## 2024-04-08 DIAGNOSIS — L03119 Cellulitis of unspecified part of limb: Secondary | ICD-10-CM | POA: Diagnosis not present

## 2024-04-08 MED ORDER — FLUCONAZOLE 150 MG PO TABS: 150.0000 mg | ORAL_TABLET | Freq: Once | ORAL | 0 refills | Status: AC

## 2024-04-08 NOTE — Telephone Encounter (Signed)
 Reason for Disposition  All other vaginal symptoms  (Exceptions: Feels like prior yeast infection, minor abrasion, mild rash < 24 hour duration, mild itching, vaginal dryness during sex.)  Answer Assessment - Initial Assessment Questions Patient requesting diflucan  for possible yeast sx. Vaginal itching, irritation, soreness since taking antibiotics x 3 weeks. Last OV 04/01/24 and 04/06/24. Patient requesting medication and no office visit due to multiple OV already.      1. SYMPTOM: What's the main symptom you're concerned about? (e.g., pain, itching, dryness)     Vaginal itching, skin irritation, soreness 2. LOCATION: Where is the  na located? (e.g., inside/outside, left/right)     na 3. ONSET: When did the  sx  start?     1 week  4. PAIN: Is there any pain? If Yes, ask: How bad is it? (Scale: 1-10; mild, moderate, severe)     Yes  5. ITCHING: Is there any itching? If Yes, ask: How bad is it? (Scale: 1-10; mild, moderate, severe)     Yes  6. CAUSE: What do you think is causing the discharge? Have you had the same problem before? What happened then?     na 7. OTHER SYMPTOMS: Do you have any other symptoms? (e.g., fever, itching, vaginal bleeding, pain with urination, injury to genital area, vaginal foreign body)     Itching, irritation, soreness  8. PREGNANCY: Is there any chance you are pregnant? When was your last menstrual period?     na  Protocols used: Vaginal Symptoms-A-AH

## 2024-04-08 NOTE — Telephone Encounter (Signed)
 Office note and labs faxed. Dr. Sirivol called [facility] ED to notify ED patient was coming. Dr. Sherre

## 2024-04-08 NOTE — Telephone Encounter (Signed)
 FYI Only or Action Required?: FYI only for provider.  Patient was last seen in primary care on 04/06/2024 by Sirivol, Mamatha, MD.  Called Nurse Triage reporting Advice Only.  Symptoms began several days ago.  Interventions attempted: Nothing.  Symptoms are: gradually worsening.  Triage Disposition: Information or Advice Only Call  Patient/caregiver understands and will follow disposition?: Yes      Copied from CRM #[id number]. Topic: Clinical - Red Word Triage >> Apr 08, 2024  3:15 PM Winona R wrote: Swelling: Pt currently taking antibiotics that is making her swell up,  The office to her to go to the ER if her swelling doesn't go down to receive antibiotics via IV and she would like to know if she need a order to have that done. Reason for Disposition  Health information question, no triage required and triager able to answer question  Answer Assessment - Initial Assessment Questions 1. REASON FOR CALL: What is the main reason for your call? or How can I best help you?     Was told to go to the ER for IV antibiotics.  Patient wanted to know if she needed an order or something.  This RN told her to go to the ER.  Protocols used: Information Only Call - No Triage-A-AH

## 2024-04-08 NOTE — Telephone Encounter (Signed)
 FYI Only or Action Required?: Action required by provider: clinical question for provider and requesting medication diflucan  for yeast sx after taking antibiotics x 3 weeks .  Patient was last seen in primary care on 04/06/2024 by Sirivol, Mamatha, MD.  Called Nurse Triage reporting Vaginal Discharge.  Symptoms began a week ago.  Interventions attempted: Rest, hydration, or home remedies.  Symptoms are: gradually worsening.  Triage Disposition: See PCP Within 2 Weeks  Patient/caregiver understands and will follow disposition?: No, wishes to speak with PCP

## 2024-04-09 ENCOUNTER — Telehealth: Payer: Self-pay

## 2024-04-09 NOTE — Telephone Encounter (Unsigned)
 Copied from CRM [id number]. Topic: General - Other >> Apr 09, 2024  8:08 AM Marissa P wrote: Reason for CRM: Patient called to let doctor [name] know that she was admitted to the hospital last night.

## 2024-04-09 NOTE — Telephone Encounter (Signed)
 Reviewed. Dr Sedalia Muta

## 2024-04-11 LAB — BASIC METABOLIC PANEL (CC13): EGFR: 97

## 2024-04-13 ENCOUNTER — Telehealth: Payer: Self-pay

## 2024-04-13 ENCOUNTER — Ambulatory Visit: Payer: Self-pay

## 2024-04-13 NOTE — Transitions of Care (Post Inpatient/ED Visit) (Signed)
   04/13/2024  Name: Brandi Velazquez MRN: [id number] DOB: July 31, 1961  Today's TOC FU Call Status: Today's TOC FU Call Status:: Successful TOC FU Call Completed TOC FU Call Complete Date: 04/13/24 (spoke with patient who states she does not feel like talking today and is returning to work tomorrow and is seeing her PCP Friday 04/17/24 for hospital follow up -- Currently declines TOC program and states she will call back if she changes her mind) Patient's Name and Date of Birth confirmed.  Transition Care Management Follow-up Telephone Call Date of Discharge: 04/11/24 Discharge Facility: Other [facility]) Name of Other (Non[facility]) Discharge Facility: [facility] Type of Discharge: Inpatient Admission Primary Inpatient Discharge Diagnosis:: Left hand swelling  Shona Prow RN, CCM [facility]  VBCI-Population Health RN Care Manager [phone]

## 2024-04-13 NOTE — Telephone Encounter (Signed)
 FYI Only or Action Required?: FYI only for provider.  Patient was last seen in primary care on 04/06/2024 by Sirivol, Mamatha, MD.  Called Nurse Triage reporting hand swelling.  Symptoms began 04/03/24 left hand symptoms, 04/11/24 right hand symptoms.  Interventions attempted: Prescription medications: IV and oral antibiotics.  Symptoms are: left hand improved pain and swelling, right hand new swelling, redness and pain; fatigueleft hand improved and right hand new symptom.  Triage Disposition: See PCP Within 2 Weeks (overriding See Physician Within 24 Hours)  Patient/caregiver understands and will follow disposition?: Yes                Copied from CRM [id number]. Topic: Clinical - Red Word Triage >> Apr 13, 2024 11:05 AM Antwanette L wrote: Red Word that prompted transfer to Nurse Triage: Pt is experiencing extreme fatigue and swelling on the right hand. Pt was discharged on 7/26 from [facility] Reason for Disposition  [1] MILD swelling (puffiness) of both hands AND [2] new-onset or getting worse  (Exception: Caused by hot weather or normal pregnancy swelling.)  Answer Assessment - Initial Assessment Questions Patient was admitted 7/23-7/26 and given IV antibiotics and sent home on oral antibiotics for cellulitis.  Compared to when she was discharged on 7/26 she states she is better except the right hand swelling is new.  1. ONSET: When did the swelling start? (e.g., minutes, hours, days)     Left hand swelling started 04/03/24. Right hand swelling started 04/11/24.  2. LOCATION: What part of the hand is swollen?  Are both hands swollen or just one hand?     Right hand is tip of thumb down to wrist; left hand swelling in thumb.  3. TYPE : What does it look like? (e.g., ball, lump; localized; hand swelling)     She states the right thumb knuckle looks like a marble and left hand is just a little bit.  4. SWELLING SEVERITY: If more than a lump or localized,  ask: How bad is the hand swelling? (e.g., mild, moderate, severe; describe)     She states it causes weakness, unable to grab objects with either hand. She states she can't make a fist. She states that has been ongoing since last week.  5. REDNESS: Is there redness or signs of infection?     Redness and swelling at right thumb knuckle.  6. PAIN: Is the swelling painful to touch? If Yes, ask: How painful is it?   (Scale 1-10; mild, moderate or severe)     No pain at rest, 6-7/10 if she has to move her hands.  7. FEVER: Do you have a fever? If Yes, ask: What is it, how was it measured, and when did it start?      No.  8. CAUSE: What do you think is causing the hand swelling? (e.g., heat, insect bite, pregnancy, recent injury)     Cellulitis.  9. MEDICAL HISTORY: Do you have a history of heart failure, kidney disease, liver failure, or cancer?     No.  10. RECURRENT SYMPTOM: Have you had hand swelling before? If Yes, ask: When was the last time? What happened that time?       No.  11. OTHER SYMPTOMS: Do you have any other symptoms? (e.g., blurred vision, difficulty breathing, headache)       Patient states she feels fatigued, tired since this morning (she states she has been able to do her normal daily activities/self care). Patient denies chest pain, SOB, headaches,  blurred vision.  12. PREGNANCY: Is there any chance you are pregnant? When was your last menstrual period?       N/A.  Protocols used: Hand Swelling-A-AH

## 2024-04-13 NOTE — Telephone Encounter (Signed)
Pt returning phone call from clinic.

## 2024-04-14 ENCOUNTER — Ambulatory Visit (INDEPENDENT_AMBULATORY_CARE_PROVIDER_SITE_OTHER): Admitting: Family Medicine

## 2024-04-14 ENCOUNTER — Encounter: Payer: Self-pay | Admitting: Family Medicine

## 2024-04-14 ENCOUNTER — Telehealth: Payer: Self-pay

## 2024-04-14 VITALS — BP 104/64 | HR 69 | Temp 97.8°F | Resp 16 | Ht 61.0 in | Wt 214.2 lb

## 2024-04-14 DIAGNOSIS — D72829 Elevated white blood cell count, unspecified: Secondary | ICD-10-CM | POA: Diagnosis not present

## 2024-04-14 DIAGNOSIS — M25441 Effusion, right hand: Secondary | ICD-10-CM | POA: Diagnosis not present

## 2024-04-14 NOTE — Telephone Encounter (Signed)
Scheduled. Dr. Tobie Poet

## 2024-04-14 NOTE — Progress Notes (Signed)
 Subjective:  Patient ID: Brandi Velazquez, female    DOB: 1960/10/26  Age: 63 y.o. MRN: [id number]  Chief Complaint  Patient presents with   Hospitalization Follow-up   Hand Pain   Discussed the use of AI scribe software for clinical note transcription with the patient, who gave verbal consent to proceed.  History of Present Illness   Brandi Velazquez is a 63 year old female who presents with swelling and pain in her hands following a recent hospital discharge.  She experiences swelling and pain in her hands, which began prior to her recent hospital admission. Initially, the swelling was in one hand, but now both hands are affected. The swelling in the first hand has decreased, but the second hand began swelling after her hospital discharge. The swelling involves the entire hand and is painful to touch.  She was admitted to the hospital on April 08, 2024, and discharged on April 11, 2024, during which she received IV antibiotics. She was sent home with clindamycin, 300 mg three times a day, which she started on April 11, 2024. She has been on multiple antibiotics and finds it difficult to keep track of them. She is currently taking prednisone  and a probiotic.  No fevers during this episode. X-rays taken at the hospital showed no signs of gout or arthritis. She is concerned about the potential for the swelling in her second hand to worsen.          05/29/2023    9:32 AM 02/08/2023    9:38 AM 02/05/2022    3:16 PM 07/18/2021   11:21 AM 07/18/2020   10:09 AM  Depression screen PHQ 2/9  Decreased Interest 0 0 0 0 0  Down, Depressed, Hopeless 0 0 0 0 0  PHQ - 2 Score 0 0 0 0 0  Altered sleeping 0 0     Tired, decreased energy 1 0     Change in appetite 1 1     Feeling bad or failure about yourself  0 0     Trouble concentrating 0 0     Moving slowly or fidgety/restless 0 0     Suicidal thoughts 0 0     PHQ-9 Score 2 1     Difficult doing work/chores Not difficult at all Not difficult at  all           05/29/2023    9:31 AM  Fall Risk   Falls in the past year? 1  Number falls in past yr: 1  Injury with Fall? 1  Risk for fall due to : No Fall Risks  Follow up Falls evaluation completed;Falls prevention discussed    Patient Care Team: Sherre Clapper, MD as PCP - General (Family Medicine) Kennedy, Nathan K, [facility] (Inactive) (Pharmacist)   Review of Systems  Constitutional:  Negative for chills, diaphoresis, fatigue and fever.  HENT:  Negative for congestion, ear pain and sinus pain.   Eyes: Negative.   Respiratory:  Negative for cough and shortness of breath.   Cardiovascular:  Negative for chest pain and palpitations.  Gastrointestinal:  Negative for abdominal pain, constipation, diarrhea, nausea and vomiting.  Endocrine: Negative.   Genitourinary:  Negative for dysuria.  Musculoskeletal:  Negative for arthralgias.  Skin: Negative.   Allergic/Immunologic: Negative.   Neurological:  Negative for dizziness, weakness, light-headedness and headaches.  Psychiatric/Behavioral:  Negative for dysphoric mood. The patient is not nervous/anxious.     Current Outpatient Medications on File Prior to Visit  Medication  Sig Dispense Refill   Blood Glucose Monitoring Suppl DEVI 1 each by Does not apply route in the morning, at noon, and at bedtime. May substitute to any manufacturer covered by patient's insurance. 1 each 0   Budeson-Glycopyrrol-Formoterol (BREZTRI  AEROSPHERE) 160-9-4.8 MCG/ACT AERO Inhale 2 puffs into the lungs in the morning and at bedtime. 10.7 g 2   clindamycin (CLEOCIN) 300 MG capsule Take 300 mg by mouth 3 (three) times daily.     Continuous Glucose Sensor (DEXCOM G7 SENSOR) MISC Change every 10 days 10 each 3   dapagliflozin  propanediol (FARXIGA ) 10 MG TABS tablet Take 1 tablet (10 mg total) by mouth daily. 90 tablet 3   ezetimibe  (ZETIA ) 10 MG tablet Take 1 tablet by mouth once daily 90 tablet 0   fluconazole  (DIFLUCAN ) 150 MG tablet Take 150 mg by mouth once.      fluticasone  (FLONASE ) 50 MCG/ACT nasal spray USE 2 SPRAY(S) IN EACH NOSTRIL ONCE DAILY AS DIRECTED 16 g 0   Glucose Blood (BLOOD GLUCOSE TEST STRIPS) STRP 1 each by In Vitro route daily. May substitute to any manufacturer covered by patient's insurance. 100 strip 3   insulin degludec  (TRESIBA  FLEXTOUCH) 200 UNIT/ML FlexTouch Pen Inject 46 Units into the skin daily. (Patient taking differently: Inject 52 Units into the skin daily.) 108 mL 0   Insulin Pen Needle (NOVOFINE PEN NEEDLE) 32G X 6 MM MISC Use new needle after every injecion 100 each 2   metFORMIN  (GLUCOPHAGE ) 1000 MG tablet Take 1 tablet by mouth twice daily with food 180 tablet 0   montelukast  (SINGULAIR ) 10 MG tablet TAKE 1 TABLET BY MOUTH AT BEDTIME 90 tablet 3   Multiple Vitamin (MULTIVITAMIN) tablet Take 1 tablet by mouth daily.     pravastatin  (PRAVACHOL ) 40 MG tablet TAKE 1 TABLET BY MOUTH ONCE DAILY AT BEDTIME 90 tablet 0   predniSONE  (DELTASONE ) 10 MG tablet Take 10 mg by mouth daily.     promethazine  (PHENERGAN ) 25 MG tablet Take 1 tablet (25 mg total) by mouth every 8 (eight) hours as needed for nausea or vomiting. 20 tablet 0   ramipril  (ALTACE ) 2.5 MG capsule Take 1 capsule by mouth in the morning 90 capsule 0   Semaglutide , 2 MG/DOSE, (OZEMPIC , 2 MG/DOSE,) 8 MG/3ML SOPN INJECT 2MG  UNDER THE SKIN ONE TIME WEEKLY 9 mL 1   spironolactone  (ALDACTONE ) 25 MG tablet Take 1 tablet (25 mg total) by mouth daily. 90 tablet 1   No current facility-administered medications on file prior to visit.   Past Medical History:  Diagnosis Date   Ankle pain    Back pain of lumbar region with sciatica 01/07/2020   Chronic pain    back   COPD (chronic obstructive pulmonary disease) (HCC)    Depression    Diabetes mellitus    GERD (gastroesophageal reflux disease)    Hyperlipidemia    Hypertension    Low back pain    Obstructive sleep apnea    Pneumonia 2010   Post-menopausal atrophic vaginitis    Superficial phlebitis    Past  Surgical History:  Procedure Laterality Date   BACK SURGERY  1990   CESAREAN SECTION     CYST REMOVAL HAND     inner thigh   REFRACTIVE SURGERY Right 06/07/2022   SPINE SURGERY     lumbar laminectomy and diskectomy    Family History  Problem Relation Age of Onset   Diabetes Mother    Heart disease Mother    Cancer Father  Diabetes Sister    Stroke Sister    Lung disease Neg Hx    Social History   Socioeconomic History   Marital status: Single    Spouse name: Not on file   Number of children: Not on file   Years of education: Not on file   Highest education level: Not on file  Occupational History   Not on file  Tobacco Use   Smoking status: Former    Current packs/day: 0.00    Average packs/day: 1.5 packs/day for 15.0 years (22.5 ttl pk-yrs)    Types: Cigarettes    Start date: 03/20/1994    Quit date: 03/20/2009    Years since quitting: 15.0   Smokeless tobacco: Never  Vaping Use   Vaping status: Never Used  Substance and Sexual Activity   Alcohol use: No   Drug use: No   Sexual activity: Not Currently    Partners: Male  Other Topics Concern   Not on file  Social History Narrative   Not on file   Social Drivers of Health   Financial Resource Strain: High Risk (08/15/2022)   Overall Financial Resource Strain (CARDIA)    Difficulty of Paying Living Expenses: Hard  Food Insecurity: No Food Insecurity (12/04/2023)   Hunger Vital Sign    Worried About Running Out of Food in the Last Year: Never true    Ran Out of Food in the Last Year: Never true  Transportation Needs: No Transportation Needs (12/04/2023)   PRAPARE - Administrator, Civil Service (Medical): No    Lack of Transportation (Non-Medical): No  Physical Activity: Inactive (05/29/2023)   Exercise Vital Sign    Days of Exercise per Week: 0 days    Minutes of Exercise per Session: 0 min  Stress: No Stress Concern Present (05/29/2023)   [employer] of Occupational Health - Occupational  Stress Questionnaire    Feeling of Stress : Not at all  Social Connections: Moderately Isolated (05/29/2023)   Social Connection and Isolation Panel    Frequency of Communication with Friends and Family: More than three times a week    Frequency of Social Gatherings with Friends and Family: More than three times a week    Attends Religious Services: More than 4 times per year    Active Member of [employer] or Organizations: No    Attends Engineer, structural: Not on file    Marital Status: Divorced    Objective:  BP 104/64   Pulse 69   Temp 97.8 F (36.6 C) (Temporal)   Resp 16   Ht 5' 1 (1.549 m)   Wt 214 lb 3.2 oz (97.2 kg)   SpO2 100%   BMI 40.47 kg/m      04/14/2024    3:39 PM 04/06/2024   10:15 AM 04/01/2024    9:39 AM  BP/Weight  Systolic BP 104 118 110  Diastolic BP 64 68 58  Wt. (Lbs) 214.2 213 212  BMI 40.47 kg/m2 40.25 kg/m2 40.06 kg/m2    Physical Exam Vitals reviewed.  Constitutional:      General: She is not in acute distress.    Appearance: Normal appearance.  Eyes:     Conjunctiva/sclera: Conjunctivae normal.  Cardiovascular:     Rate and Rhythm: Normal rate and regular rhythm.     Heart sounds: Normal heart sounds. No murmur heard. Pulmonary:     Effort: Pulmonary effort is normal.     Breath sounds: Normal breath sounds. No wheezing.  Abdominal:     General: Bowel sounds are normal.     Palpations: Abdomen is soft.     Tenderness: There is no abdominal tenderness.  Musculoskeletal:        General: Swelling present. Normal range of motion.     Right hand: Swelling present.     Left hand: Normal.     Comments: See photo  Skin:    Findings: Erythema present.  Neurological:     Mental Status: She is alert. Mental status is at baseline.  Psychiatric:        Mood and Affect: Mood normal.        Behavior: Behavior normal.       Lab Results  Component Value Date   WBC 13.9 (H) 04/06/2024   HGB 15.0 04/06/2024   HCT 46.4 04/06/2024    PLT 332 04/06/2024   GLUCOSE 119 (H) 04/01/2024   CHOL 115 04/01/2024   TRIG 82 04/01/2024   HDL 45 04/01/2024   LDLCALC 54 04/01/2024   ALT 21 04/01/2024   AST 17 04/01/2024   NA 137 04/01/2024   K 4.1 04/01/2024   CL 100 04/01/2024   CREATININE 0.72 04/01/2024   BUN 18 04/01/2024   CO2 20 04/01/2024   TSH 1.860 09/03/2023   HGBA1C 7.2 (H) 04/01/2024   MICROALBUR 30 05/18/2021      Assessment & Plan:  Swelling of joint, hand, right Assessment & Plan: Acute swelling of the right carpometacarpal joint. post-hospitalization for previous bilateral hand swelling, sepsis and pain which she spent 3 days admitted for IV antibiotics. No fever. X-rays negative for gout or arthritis. - Order CBC to monitor WBC count. - Continue clindamycin 300 mg TID. - Continue prednisone  and probiotic. - Advised to return to hospital if symptoms worsen or CBC indicates worsening luekocytosis   Leukocytosis, unspecified type Assessment & Plan: Labs drawn today, Await labs/testing for assessment and recommendations   Orders: -     CBC with Differential/Platelet     No orders of the defined types were placed in this encounter.   Orders Placed This Encounter  Procedures   CBC with Differential        Follow-up: Return if symptoms worsen or fail to improve.   I,Angela Taylor,acting as a Neurosurgeon for Harrie CHRISTELLA Cedar, FNP.,have documented all relevant documentation on the behalf of Harrie CHRISTELLA Cedar, FNP,as directed by  Harrie CHRISTELLA Cedar, FNP while in the presence of Harrie CHRISTELLA Cedar, FNP.   An After Visit Summary was printed and given to the patient.  I attest that I have reviewed this visit and agree with the plan scribed by my staff.   Harrie CHRISTELLA Cedar, FNP [name] [phone]

## 2024-04-14 NOTE — Patient Instructions (Signed)
  VISIT SUMMARY: Today, you were seen for swelling and pain in your hands that started before your recent hospital stay. The swelling initially affected one hand but has now spread to both hands, although the swelling in the first hand has decreased. You were discharged from the hospital on April 11, 2024, after receiving IV antibiotics and were prescribed clindamycin to take at home. You are also taking prednisone  and a probiotic. X-rays taken at the hospital showed no signs of gout or arthritis, and you have not had any fevers.  YOUR PLAN: -HAND SWELLING AND PAIN, POSSIBLE SOFT TISSUE INFECTION: Your hand swelling and pain may be due to a soft tissue infection. We will monitor your white blood cell count with a blood test to check for infection. Please continue taking clindamycin 300 mg three times a day, as well as your prednisone  and probiotic. If your symptoms worsen or the blood test shows signs of a worsening infection, you should return to the hospital.  INSTRUCTIONS: Please follow up with a blood test to monitor your white blood cell count. Return to the hospital if your symptoms worsen or if the blood test indicates a worsening infection.                                    Contains text generated by [employer].

## 2024-04-14 NOTE — Telephone Encounter (Signed)
 Completed re-order from for Ozempic  and pen needles and faxed to Dr. Sherre to review and sign and return to [facility] at [phone]

## 2024-04-14 NOTE — Assessment & Plan Note (Signed)
 Labs drawn today, Await labs/testing for assessment and recommendations

## 2024-04-14 NOTE — Telephone Encounter (Signed)
 Signed reorder form faxed to [employer] [facility]

## 2024-04-14 NOTE — Assessment & Plan Note (Signed)
 Acute swelling of the right carpometacarpal joint. post-hospitalization for previous bilateral hand swelling, sepsis and pain which she spent 3 days admitted for IV antibiotics. No fever. X-rays negative for gout or arthritis. - Order CBC to monitor WBC count. - Continue clindamycin 300 mg TID. - Continue prednisone  and probiotic. - Advised to return to hospital if symptoms worsen or CBC indicates worsening luekocytosis

## 2024-04-15 ENCOUNTER — Ambulatory Visit: Payer: Self-pay | Admitting: Family Medicine

## 2024-04-15 LAB — CBC WITH DIFFERENTIAL/PLATELET
Basophils Absolute: 0 x10E3/uL (ref 0.0–0.2)
Basos: 0 %
EOS (ABSOLUTE): 0.1 x10E3/uL (ref 0.0–0.4)
Eos: 1 %
Hematocrit: 42 % (ref 34.0–46.6)
Hemoglobin: 13.8 g/dL (ref 11.1–15.9)
Immature Grans (Abs): 0 x10E3/uL (ref 0.0–0.1)
Immature Granulocytes: 0 %
Lymphocytes Absolute: 3.2 x10E3/uL — ABNORMAL HIGH (ref 0.7–3.1)
Lymphs: 27 %
MCH: 28.7 pg (ref 26.6–33.0)
MCHC: 32.9 g/dL (ref 31.5–35.7)
MCV: 87 fL (ref 79–97)
Monocytes Absolute: 0.6 x10E3/uL (ref 0.1–0.9)
Monocytes: 5 %
Neutrophils Absolute: 7.9 x10E3/uL — ABNORMAL HIGH (ref 1.4–7.0)
Neutrophils: 67 %
Platelets: 397 x10E3/uL (ref 150–450)
RBC: 4.81 x10E6/uL (ref 3.77–5.28)
RDW: 12.9 % (ref 11.7–15.4)
WBC: 11.9 x10E3/uL — ABNORMAL HIGH (ref 3.4–10.8)

## 2024-04-17 ENCOUNTER — Inpatient Hospital Stay: Admitting: Family Medicine

## 2024-04-20 ENCOUNTER — Other Ambulatory Visit: Payer: Self-pay

## 2024-04-20 ENCOUNTER — Ambulatory Visit: Payer: Self-pay

## 2024-04-20 ENCOUNTER — Other Ambulatory Visit: Payer: Self-pay | Admitting: Family Medicine

## 2024-04-20 ENCOUNTER — Encounter: Payer: Self-pay | Admitting: Family Medicine

## 2024-04-20 ENCOUNTER — Ambulatory Visit (INDEPENDENT_AMBULATORY_CARE_PROVIDER_SITE_OTHER): Admitting: Family Medicine

## 2024-04-20 VITALS — BP 110/62 | HR 84 | Temp 98.4°F | Ht 61.0 in | Wt 212.0 lb

## 2024-04-20 DIAGNOSIS — M25531 Pain in right wrist: Secondary | ICD-10-CM | POA: Diagnosis not present

## 2024-04-20 DIAGNOSIS — M79642 Pain in left hand: Secondary | ICD-10-CM

## 2024-04-20 DIAGNOSIS — M79641 Pain in right hand: Secondary | ICD-10-CM

## 2024-04-20 DIAGNOSIS — E1142 Type 2 diabetes mellitus with diabetic polyneuropathy: Secondary | ICD-10-CM | POA: Diagnosis not present

## 2024-04-20 DIAGNOSIS — M25532 Pain in left wrist: Secondary | ICD-10-CM

## 2024-04-20 DIAGNOSIS — L03119 Cellulitis of unspecified part of limb: Secondary | ICD-10-CM

## 2024-04-20 DIAGNOSIS — J449 Chronic obstructive pulmonary disease, unspecified: Secondary | ICD-10-CM

## 2024-04-20 MED ORDER — AMOXICILLIN-POT CLAVULANATE 875-125 MG PO TABS: 1.0000 | ORAL_TABLET | Freq: Two times a day (BID) | ORAL | 0 refills | Status: DC

## 2024-04-20 MED ORDER — DAPAGLIFLOZIN PROPANEDIOL 10 MG PO TABS: 10.0000 mg | ORAL_TABLET | Freq: Every day | ORAL | 3 refills | Status: AC

## 2024-04-20 MED ORDER — TRIAMCINOLONE ACETONIDE 40 MG/ML IJ SUSP
80.0000 mg | Freq: Once | INTRAMUSCULAR | Status: AC
  Administered 2024-04-20: 80 mg via INTRAMUSCULAR

## 2024-04-20 MED ORDER — BREZTRI AEROSPHERE 160-9-4.8 MCG/ACT IN AERO: 2.0000 | INHALATION_SPRAY | Freq: Two times a day (BID) | RESPIRATORY_TRACT | 2 refills | Status: AC

## 2024-04-20 NOTE — Progress Notes (Signed)
 Acute Office Visit  Subjective:    Patient ID: Brandi Velazquez, female    DOB: 16-Oct-1960, 63 y.o.   MRN: [id number]  Chief Complaint  Patient presents with   Hand Swelling    HPI: She was admitted to the hospital on April 08, 2024, and discharged on April 11, 2024 for cellulitis of left hand, during which she received IV antibiotics. She was sent home with clindamycin, 300 mg three times a day, which she started on April 11, 2024. She has completed the clindamycin. She improved, but is now worsening. Patient is in today for swelling of left hand, has completed antibiotics and swelling has returning. States from her fingertips to her wrist is painful, decreased strength. Difficulty flexing fingers. Patient's hands are very stiff. The above cellulitis and joint pain was preceded by strep positive pharyngitis for which she only took 3 days of antibiotics.   Past Medical History:  Diagnosis Date   Ankle pain    Back pain of lumbar region with sciatica 01/07/2020   Chronic pain    back   COPD (chronic obstructive pulmonary disease) (HCC)    Depression    Diabetes mellitus    GERD (gastroesophageal reflux disease)    Hyperlipidemia    Hypertension    Low back pain    Obstructive sleep apnea    Pneumonia 2010   Post-menopausal atrophic vaginitis    Superficial phlebitis     Past Surgical History:  Procedure Laterality Date   BACK SURGERY  1990   CESAREAN SECTION     CYST REMOVAL HAND     inner thigh   REFRACTIVE SURGERY Right 06/07/2022   SPINE SURGERY     lumbar laminectomy and diskectomy    Family History  Problem Relation Age of Onset   Diabetes Mother    Heart disease Mother    Cancer Father    Diabetes Sister    Stroke Sister    Lung disease Neg Hx     Social History   Socioeconomic History   Marital status: Single    Spouse name: Not on file   Number of children: Not on file   Years of education: Not on file   Highest education level: Not on file   Occupational History   Not on file  Tobacco Use   Smoking status: Former    Current packs/day: 0.00    Average packs/day: 1.5 packs/day for 15.0 years (22.5 ttl pk-yrs)    Types: Cigarettes    Start date: 03/20/1994    Quit date: 03/20/2009    Years since quitting: 15.0   Smokeless tobacco: Never  Vaping Use   Vaping status: Never Used  Substance and Sexual Activity   Alcohol use: No   Drug use: No   Sexual activity: Not Currently    Partners: Male  Other Topics Concern   Not on file  Social History Narrative   Not on file   Social Drivers of Health   Financial Resource Strain: High Risk (08/15/2022)   Overall Financial Resource Strain (CARDIA)    Difficulty of Paying Living Expenses: Hard  Food Insecurity: No Food Insecurity (12/04/2023)   Hunger Vital Sign    Worried About Running Out of Food in the Last Year: Never true    Ran Out of Food in the Last Year: Never true  Transportation Needs: No Transportation Needs (12/04/2023)   PRAPARE - Administrator, Civil Service (Medical): No    Lack of Transportation (  Non-Medical): No  Physical Activity: Inactive (05/29/2023)   Exercise Vital Sign    Days of Exercise per Week: 0 days    Minutes of Exercise per Session: 0 min  Stress: No Stress Concern Present (05/29/2023)   [employer] of Occupational Health - Occupational Stress Questionnaire    Feeling of Stress : Not at all  Social Connections: Moderately Isolated (05/29/2023)   Social Connection and Isolation Panel    Frequency of Communication with Friends and Family: More than three times a week    Frequency of Social Gatherings with Friends and Family: More than three times a week    Attends Religious Services: More than 4 times per year    Active Member of [employer] or Organizations: No    Attends Engineer, structural: Not on file    Marital Status: Divorced  Intimate Partner Violence: Not At Risk (12/04/2023)   Humiliation, Afraid, Rape, and Kick  questionnaire    Fear of Current or Ex-Partner: No    Emotionally Abused: No    Physically Abused: No    Sexually Abused: No    Outpatient Medications Prior to Visit  Medication Sig Dispense Refill   Blood Glucose Monitoring Suppl DEVI 1 each by Does not apply route in the morning, at noon, and at bedtime. May substitute to any manufacturer covered by patient's insurance. 1 each 0   Continuous Glucose Sensor (DEXCOM G7 SENSOR) MISC Change every 10 days 10 each 3   ezetimibe  (ZETIA ) 10 MG tablet Take 1 tablet by mouth once daily 90 tablet 0   fluticasone  (FLONASE ) 50 MCG/ACT nasal spray USE 2 SPRAY(S) IN EACH NOSTRIL ONCE DAILY AS DIRECTED 16 g 0   Glucose Blood (BLOOD GLUCOSE TEST STRIPS) STRP 1 each by In Vitro route daily. May substitute to any manufacturer covered by patient's insurance. 100 strip 3   insulin degludec  (TRESIBA  FLEXTOUCH) 200 UNIT/ML FlexTouch Pen Inject 46 Units into the skin daily. (Patient taking differently: Inject 52 Units into the skin daily.) 108 mL 0   Insulin Pen Needle (NOVOFINE PEN NEEDLE) 32G X 6 MM MISC Use new needle after every injecion 100 each 2   metFORMIN  (GLUCOPHAGE ) 1000 MG tablet Take 1 tablet by mouth twice daily with food 180 tablet 0   montelukast  (SINGULAIR ) 10 MG tablet TAKE 1 TABLET BY MOUTH AT BEDTIME 90 tablet 3   Multiple Vitamin (MULTIVITAMIN) tablet Take 1 tablet by mouth daily.     pravastatin  (PRAVACHOL ) 40 MG tablet TAKE 1 TABLET BY MOUTH ONCE DAILY AT BEDTIME 90 tablet 0   promethazine  (PHENERGAN ) 25 MG tablet Take 1 tablet (25 mg total) by mouth every 8 (eight) hours as needed for nausea or vomiting. 20 tablet 0   ramipril  (ALTACE ) 2.5 MG capsule Take 1 capsule by mouth in the morning 90 capsule 0   Semaglutide , 2 MG/DOSE, (OZEMPIC , 2 MG/DOSE,) 8 MG/3ML SOPN INJECT 2MG  UNDER THE SKIN ONE TIME WEEKLY 9 mL 1   spironolactone  (ALDACTONE ) 25 MG tablet Take 1 tablet (25 mg total) by mouth daily. 90 tablet 1   Budeson-Glycopyrrol-Formoterol  (BREZTRI  AEROSPHERE) 160-9-4.8 MCG/ACT AERO Inhale 2 puffs into the lungs in the morning and at bedtime. 10.7 g 2   dapagliflozin  propanediol (FARXIGA ) 10 MG TABS tablet Take 1 tablet (10 mg total) by mouth daily. 90 tablet 3   predniSONE  (DELTASONE ) 10 MG tablet Take 10 mg by mouth daily.     clindamycin (CLEOCIN) 300 MG capsule Take 300 mg by mouth 3 (three)  times daily.     fluconazole  (DIFLUCAN ) 150 MG tablet Take 150 mg by mouth once.     No facility-administered medications prior to visit.    Allergies  Allergen Reactions   Bactrim [Sulfamethoxazole-Trimethoprim] Swelling   Morphine And Codeine    Zofran  [Ondansetron ] Nausea And Vomiting    Review of Systems  Constitutional:  Negative for chills, fatigue and fever.  HENT:  Negative for congestion, ear pain, rhinorrhea and sore throat.   Respiratory:  Negative for cough and shortness of breath.   Cardiovascular:  Negative for chest pain.  Gastrointestinal:  Negative for abdominal pain, constipation, diarrhea, nausea and vomiting.  Genitourinary:  Negative for dysuria and urgency.  Musculoskeletal:  Positive for arthralgias. Negative for back pain and myalgias.  Neurological:  Negative for dizziness, weakness, light-headedness and headaches.  Psychiatric/Behavioral:  Negative for dysphoric mood. The patient is not nervous/anxious.        Objective:        04/20/2024   11:31 AM 04/14/2024    3:39 PM 04/06/2024   10:15 AM  Vitals with BMI  Height 5' 1 5' 1 5' 1  Weight 212 lbs 214 lbs 3 oz 213 lbs  BMI 40.08 40.49 40.27  Systolic 110 104 881  Diastolic 62 64 68  Pulse 84 69 86    No data found.   Physical Exam Vitals reviewed.  Constitutional:      Appearance: Normal appearance. She is obese.  Cardiovascular:     Rate and Rhythm: Normal rate and regular rhythm.     Heart sounds: Normal heart sounds.  Pulmonary:     Effort: Pulmonary effort is normal.     Breath sounds: Normal breath sounds.  Musculoskeletal:         General: Swelling and tenderness present.     Comments: Mild swelling in BL hands. Unable to grip hands due to stiffness. Minimal erythema. Feet normal.   Neurological:     Mental Status: She is alert and oriented to person, place, and time.     Health Maintenance Due  Topic Date Due   Zoster Vaccines- Shingrix (1 of 2) Never done   Pneumococcal Vaccine: 19-49 Years (2 of 2 - PCV) 10/08/2012   Pneumococcal Vaccine: 50+ Years (2 of 2 - PCV) 10/08/2012   COVID-19 Vaccine (6 - Moderna risk 2024-25 season) 03/03/2024   OPHTHALMOLOGY EXAM  04/15/2024   Cervical Cancer Screening (HPV/Pap Cotest)  06/06/2024    There are no preventive care reminders to display for this patient.   Lab Results  Component Value Date   TSH 1.860 09/03/2023   Lab Results  Component Value Date   WBC 11.9 (H) 04/14/2024   HGB 13.8 04/14/2024   HCT 42.0 04/14/2024   MCV 87 04/14/2024   PLT 397 04/14/2024   Lab Results  Component Value Date   NA 137 04/01/2024   K 4.1 04/01/2024   CO2 20 04/01/2024   GLUCOSE 119 (H) 04/01/2024   BUN 18 04/01/2024   CREATININE 0.72 04/01/2024   BILITOT 0.5 04/01/2024   ALKPHOS 66 04/01/2024   AST 17 04/01/2024   ALT 21 04/01/2024   PROT 7.1 04/01/2024   ALBUMIN 4.2 04/01/2024   CALCIUM 9.2 04/01/2024   EGFR 93 04/01/2024   Lab Results  Component Value Date   CHOL 115 04/01/2024   Lab Results  Component Value Date   HDL 45 04/01/2024   Lab Results  Component Value Date   LDLCALC 54 04/01/2024  Lab Results  Component Value Date   TRIG 82 04/01/2024   Lab Results  Component Value Date   CHOLHDL 2.6 04/01/2024   Lab Results  Component Value Date   HGBA1C 7.2 (H) 04/01/2024       Assessment & Plan:  Pain in both hands Assessment & Plan: Ordering arthritis panel.  Orders: -     Rheumatoid factor -     CYCLIC CITRUL PEPTIDE ANTIBODY, IGG/IGA -     Sedimentation rate -     C-reactive protein -     CBC with Differential/Platelet -      TSH -     Antistreptolysin O titer -     Triamcinolone  Acetonide  Pain in both wrists Assessment & Plan: Ordering arthritis panel.    Orders: -     Rheumatoid factor -     CYCLIC CITRUL PEPTIDE ANTIBODY, IGG/IGA -     Sedimentation rate -     C-reactive protein -     CBC with Differential/Platelet -     TSH -     Antistreptolysin O titer -     Triamcinolone  Acetonide  Diabetic polyneuropathy associated with type 2 diabetes mellitus (HCC) Assessment & Plan: If we end up putting her on prednisone , I would recommend increase her insulin.  Orders: -     Comprehensive metabolic panel with GFR  Cellulitis of upper extremity, unspecified laterality Assessment & Plan: BL Hands. 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  Ordering labs.  Start on augmentin .   Orders: -     Amoxicillin -Pot Clavulanate; Take 1 tablet by mouth 2 (two) times daily.  Dispense: 20 tablet; Refill: 0     Meds ordered this encounter  Medications   amoxicillin -clavulanate (AUGMENTIN ) 875-125 MG tablet    Sig: Take 1 tablet by mouth 2 (two) times daily.    Dispense:  20 tablet    Refill:  0   triamcinolone  acetonide (KENALOG -40) injection 80 mg    Orders Placed This Encounter  Procedures   Rheumatoid factor   CYCLIC CITRUL PEPTIDE ANTIBODY, IGG/IGA   Sedimentation rate   C-reactive protein   CBC with Differential/Platelet   TSH   Antistreptolysin O titer   Comprehensive metabolic panel with GFR     Follow-up: Return in about 1 week (around 04/27/2024) for chronic follow up.  An After Visit Summary was printed and given to the patient.  Abigail Free, MD [facility] [phone]

## 2024-04-20 NOTE — Assessment & Plan Note (Signed)
 Ordering arthritis panel.

## 2024-04-20 NOTE — Patient Instructions (Signed)
 Start on augmentin  875 mg twice daily x 10 days.  Kenalog  shot (steroids) 80 mg given.  Labs done.

## 2024-04-20 NOTE — Assessment & Plan Note (Signed)
 If we end up putting her on prednisone , I would recommend increase her insulin.

## 2024-04-20 NOTE — Assessment & Plan Note (Signed)
 BL Hands. 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  Ordering labs.  Start on augmentin .

## 2024-04-20 NOTE — Telephone Encounter (Signed)
 FYI Only or Action Required?: FYI only for provider.  Patient was last seen in primary care on 04/14/2024 by Teressa Harrie HERO, FNP.  Called Nurse Triage reporting Hand Pain.  Symptoms began few days ago.  Interventions attempted: Nothing.  Symptoms are: gradually worsening.  Triage Disposition: See PCP When Office is Open (Within 3 Days)  Patient/caregiver understands and will follow disposition?: yes         Copied from CRM #[id number]. Topic: Clinical - Red Word Triage >> Apr 20, 2024  8:02 AM Montie POUR wrote: Red Word that prompted transfer to Nurse Triage:  Both hands are swollen, red, warm to touch. Pain at a 7 Reason for Disposition  [1] MODERATE pain (e.g., interferes with normal activities) AND [2] present > 3 days  Answer Assessment - Initial Assessment Questions 1. ONSET: When did the pain start?     3 days ago  2. LOCATION: Where is the pain located?     Left hand and right thumb 3. PAIN: How bad is the pain? (Scale 1-10; or mild, moderate, severe)     7/10 5. CAUSE: What do you think is causing the pain?     *No Answer* 7. OTHER SYMPTOMS: Do you have any other symptoms? (e.g., fever, neck pain, numbness or tingling, rash, swelling)     Swelling/redness/ hands feel weak  Protocols used: Hand Pain-A-AH

## 2024-04-21 LAB — CBC WITH DIFFERENTIAL/PLATELET
Basophils Absolute: 0 x10E3/uL (ref 0.0–0.2)
Basos: 0 %
EOS (ABSOLUTE): 0.2 x10E3/uL (ref 0.0–0.4)
Eos: 2 %
Hematocrit: 45.2 % (ref 34.0–46.6)
Hemoglobin: 14.1 g/dL (ref 11.1–15.9)
Immature Grans (Abs): 0.1 x10E3/uL (ref 0.0–0.1)
Immature Granulocytes: 1 %
Lymphocytes Absolute: 3.3 x10E3/uL — ABNORMAL HIGH (ref 0.7–3.1)
Lymphs: 26 %
MCH: 28 pg (ref 26.6–33.0)
MCHC: 31.2 g/dL — ABNORMAL LOW (ref 31.5–35.7)
MCV: 90 fL (ref 79–97)
Monocytes Absolute: 1.2 x10E3/uL — ABNORMAL HIGH (ref 0.1–0.9)
Monocytes: 9 %
Neutrophils Absolute: 8.2 x10E3/uL — ABNORMAL HIGH (ref 1.4–7.0)
Neutrophils: 62 %
Platelets: 361 x10E3/uL (ref 150–450)
RBC: 5.03 x10E6/uL (ref 3.77–5.28)
RDW: 13.2 % (ref 11.7–15.4)
WBC: 13 x10E3/uL — ABNORMAL HIGH (ref 3.4–10.8)

## 2024-04-21 LAB — COMPREHENSIVE METABOLIC PANEL WITH GFR
ALT: 19 IU/L (ref 0–32)
AST: 13 IU/L (ref 0–40)
Albumin: 4.1 g/dL (ref 3.9–4.9)
Alkaline Phosphatase: 57 IU/L (ref 44–121)
BUN/Creatinine Ratio: 35 — ABNORMAL HIGH (ref 12–28)
BUN: 25 mg/dL (ref 8–27)
Bilirubin Total: 0.4 mg/dL (ref 0.0–1.2)
CO2: 22 mmol/L (ref 20–29)
Calcium: 10 mg/dL (ref 8.7–10.3)
Chloride: 100 mmol/L (ref 96–106)
Creatinine, Ser: 0.72 mg/dL (ref 0.57–1.00)
Globulin, Total: 2.6 g/dL (ref 1.5–4.5)
Glucose: 113 mg/dL — ABNORMAL HIGH (ref 70–99)
Potassium: 4.6 mmol/L (ref 3.5–5.2)
Sodium: 137 mmol/L (ref 134–144)
Total Protein: 6.7 g/dL (ref 6.0–8.5)
eGFR: 93 mL/min/1.73 (ref >59)

## 2024-04-21 LAB — TSH: TSH: 2.27 u[IU]/mL (ref 0.450–4.500)

## 2024-04-21 LAB — C-REACTIVE PROTEIN: CRP: 14 mg/L — ABNORMAL HIGH (ref 0–10)

## 2024-04-21 LAB — CYCLIC CITRUL PEPTIDE ANTIBODY, IGG/IGA: Cyclic Citrullin Peptide Ab: 5 U (ref 0–19)

## 2024-04-21 LAB — RHEUMATOID FACTOR: Rheumatoid fact SerPl-aCnc: <10 [IU]/mL (ref <14.0)

## 2024-04-21 LAB — SEDIMENTATION RATE: Sed Rate: 41 mm/h — ABNORMAL HIGH (ref 0–40)

## 2024-04-21 LAB — ANTISTREPTOLYSIN O TITER: ASO: 216 [IU]/mL — ABNORMAL HIGH (ref 0.0–200.0)

## 2024-04-22 ENCOUNTER — Ambulatory Visit: Payer: Self-pay | Admitting: Family Medicine

## 2024-04-22 ENCOUNTER — Ambulatory Visit (INDEPENDENT_AMBULATORY_CARE_PROVIDER_SITE_OTHER): Admitting: Family Medicine

## 2024-04-22 ENCOUNTER — Encounter: Payer: Self-pay | Admitting: Family Medicine

## 2024-04-22 ENCOUNTER — Ambulatory Visit: Payer: Self-pay

## 2024-04-22 VITALS — BP 106/62 | HR 84 | Temp 97.6°F | Resp 16 | Ht 61.0 in | Wt 211.2 lb

## 2024-04-22 DIAGNOSIS — M11242 Other chondrocalcinosis, left hand: Secondary | ICD-10-CM | POA: Diagnosis not present

## 2024-04-22 DIAGNOSIS — R2232 Localized swelling, mass and lump, left upper limb: Secondary | ICD-10-CM | POA: Diagnosis not present

## 2024-04-22 MED ORDER — COLCHICINE 0.6 MG PO TABS: 0.6000 mg | ORAL_TABLET | Freq: Two times a day (BID) | ORAL | 0 refills | Status: DC

## 2024-04-22 MED ORDER — CEFTRIAXONE SODIUM 1 G IJ SOLR
1.0000 g | Freq: Once | INTRAMUSCULAR | Status: AC
  Administered 2024-04-22: 1 g via INTRAMUSCULAR

## 2024-04-22 NOTE — Telephone Encounter (Signed)
 FYI Only or Action Required?: Action required by provider: update on patient condition.  Patient was last seen in primary care on 04/20/2024 by Sherre Clapper, MD.  Called Nurse Triage reporting Hand swelling.  Symptoms began several weeks ago.  Interventions attempted: OTC medications: ibuprofren and Prescription medications: Augmentin .  Symptoms are: rapidly worsening.  Triage Disposition: See Physician Within 24 Hours  Patient/caregiver understands and will follow disposition?: Yes  Copied from CRM #[id number]. Topic: Clinical - Red Word Triage >> Apr 22, 2024 12:11 PM Yolanda T wrote: Red Word that prompted transfer to Nurse Triage: patient called stated she was experiencing swelling on in the index, middle and ring finger in her left hand. She has been given meds 5 weeks ago but it was not working. Patient stated it is hot to the touch. Reason for Disposition  [1] Taking antibiotic > 24 hours AND [2] cellulitis symptoms are WORSE (e.g., spreading redness, pain, swelling)  Answer Assessment - Initial Assessment Questions 1. INFECTION: What infection is the antibiotic being given for?     Cellulitis and streptococcal arthritis  2. ANTIBIOTIC: What antibiotic are you taking How many times per day?     Augmentin   3. DURATION: When was the antibiotic started?     8/4  4. MAIN CONCERN OR SYMPTOM:  What is your main concern right now?     Hand swelling worsening, now extending to index, middle, ring finger, and wrist  5. BETTER-SAME-WORSE: Are you getting better, staying the same, or getting worse compared to when you first started the antibiotics? If getting worse, ask: In what way?      Getting worse  6. FEVER: Do you have a fever? If Yes, ask: What is your temperature, how was it measured, and when did it start?     No  7. SYMPTOMS: Are there any other symptoms you're concerned about? If Yes, ask: When did it start?     No  8. FOLLOW-UP APPOINTMENT: Do you  have a follow-up appointment with your doctor?     Has f/u 8/11  9. PREGNANCY: Is there any chance you are pregnant? When was your last menstrual period?     No  Protocols used: Infection on Antibiotic Follow-up Call-A-AH, Cellulitis on Antibiotic Follow-up Call-A-AH

## 2024-04-22 NOTE — Assessment & Plan Note (Signed)
 Possible pseudogout of the left hand with joint involvement. Redness, swelling and heat during assessment. - Colchicine  0.6 mg by mouth TWICE A DAY for 7 days. - FU on Monday with PCP

## 2024-04-22 NOTE — Assessment & Plan Note (Addendum)
 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 Augmentin  not tolerated due to vomiting. Condition worsened with increased swelling and pain. - Contact infectious disease specialist for outpatient IV antibiotic therapy. Lab Results  Component Value Date   WBC 13.0 (H) 04/20/2024   HGB 14.1 04/20/2024   HCT 45.2 04/20/2024   MCV 90 04/20/2024   PLT 361 04/20/2024    Lab Results  Component Value Date   CRP 14 (H) 04/20/2024   Lab Results  Component Value Date   ESRSEDRATE 41 (H) 04/20/2024   Pain management for hand and wrist pain Severe hand and wrist pain. Managed with ibuprofen  and tramadol . - Continue ibuprofen  and tramadol .  - Rocephin  1g  IM in the office for possible cellulitis (unable to tolerate current medication Rx'd) - Dr. Sherre spoke with infectious disease and they directed her to get her in to see an orthopedic surgeon for possible injection. - Referral to Dr. Camella for evalation and treat versus sending her to the hospital

## 2024-04-22 NOTE — Progress Notes (Signed)
 Acute Office Visit  Subjective:    Patient ID: Brandi Velazquez, female    DOB: 09-29-1960, 63 y.o.   MRN: [id number]  Chief Complaint  Patient presents with   Hand Pain    SWELLING LEFT HAND    Discussed the use of AI scribe software for clinical note transcription with the patient, who gave verbal consent to proceed.  History of Present Illness   Brandi Velazquez is a 63 year old white female who was seen on April 06, 2024 by Dr. Sirivol in our office for hand pain and swelling.  The previous weekend her daughter and granddaughters had strep throat on 2 days prior to her appointment with Dr. Sharrie e-visit and was given Augmentin .  Subsequently her hands and wrists swell particularly the left one initially.  Lab work done on that day showed elevated inflammatory markers.  Her strep test was negative however she been on antibiotics for 2 days.  When Dr. Sirivol saw her she switched her from Augmentin  to doxycycline .  1 day after this appointment the patient was worsening and she was directed to [facility].  She was admitted and treated for cellulitis.  She was given IV antibiotics and subsequently discharged home on clindamycin as well as prednisone .  She did improve however she completed these medicines approximately 5 days ago.  At that point she started to worsen again.  Dr. Sherre saw her on Monday and tried Augmentin  again.  She is having nausea and difficulty tolerating the Augmentin .  Her left wrist has worsened with redness, heat, and pain.  No fevers.  Her right wrist and hand are also having pain but not as much redness and swelling.    She experiences constant and severe pain in her wrists, particularly in the left hand, which has started to swell again. She describes the pain as severe, stating 'it hurts really bad'.  She is currently taking ibuprofen , approximately six low-dose tablets a day, and tramadol  at night for pain management. She has been experiencing issues with her  current antibiotic treatment, Augmentin , which causes her to vomit. She was previously thought to be allergic to Augmentin , but it was reintroduced due to the swelling. Despite this, she is unable to tolerate it and is seeking alternative treatment options.      Past Medical History:  Diagnosis Date   Ankle pain    Back pain of lumbar region with sciatica 01/07/2020   Chronic pain    back   COPD (chronic obstructive pulmonary disease) (HCC)    Depression    Diabetes mellitus    GERD (gastroesophageal reflux disease)    Hyperlipidemia    Hypertension    Low back pain    Obstructive sleep apnea    Pneumonia 2010   Post-menopausal atrophic vaginitis    Superficial phlebitis     Past Surgical History:  Procedure Laterality Date   BACK SURGERY  1990   CESAREAN SECTION     CYST REMOVAL HAND     inner thigh   REFRACTIVE SURGERY Right 06/07/2022   SPINE SURGERY     lumbar laminectomy and diskectomy    Family History  Problem Relation Age of Onset   Diabetes Mother    Heart disease Mother    Cancer Father    Diabetes Sister    Stroke Sister    Lung disease Neg Hx     Social History   Socioeconomic History   Marital status: Single    Spouse name: Not  on file   Number of children: Not on file   Years of education: Not on file   Highest education level: Not on file  Occupational History   Not on file  Tobacco Use   Smoking status: Former    Current packs/day: 0.00    Average packs/day: 1.5 packs/day for 15.0 years (22.5 ttl pk-yrs)    Types: Cigarettes    Start date: 03/20/1994    Quit date: 03/20/2009    Years since quitting: 15.1   Smokeless tobacco: Never  Vaping Use   Vaping status: Never Used  Substance and Sexual Activity   Alcohol use: No   Drug use: No   Sexual activity: Not Currently    Partners: Male  Other Topics Concern   Not on file  Social History Narrative   Not on file   Social Drivers of Health   Financial Resource Strain: High Risk  (08/15/2022)   Overall Financial Resource Strain (CARDIA)    Difficulty of Paying Living Expenses: Hard  Food Insecurity: No Food Insecurity (12/04/2023)   Hunger Vital Sign    Worried About Running Out of Food in the Last Year: Never true    Ran Out of Food in the Last Year: Never true  Transportation Needs: No Transportation Needs (12/04/2023)   PRAPARE - Administrator, Civil Service (Medical): No    Lack of Transportation (Non-Medical): No  Physical Activity: Inactive (05/29/2023)   Exercise Vital Sign    Days of Exercise per Week: 0 days    Minutes of Exercise per Session: 0 min  Stress: No Stress Concern Present (05/29/2023)   [employer] of Occupational Health - Occupational Stress Questionnaire    Feeling of Stress : Not at all  Social Connections: Moderately Isolated (05/29/2023)   Social Connection and Isolation Panel    Frequency of Communication with Friends and Family: More than three times a week    Frequency of Social Gatherings with Friends and Family: More than three times a week    Attends Religious Services: More than 4 times per year    Active Member of [employer] or Organizations: No    Attends Banker Meetings: Not on file    Marital Status: Divorced  Intimate Partner Violence: Not At Risk (12/04/2023)   Humiliation, Afraid, Rape, and Kick questionnaire    Fear of Current or Ex-Partner: No    Emotionally Abused: No    Physically Abused: No    Sexually Abused: No    Outpatient Medications Prior to Visit  Medication Sig Dispense Refill   amoxicillin -clavulanate (AUGMENTIN ) 875-125 MG tablet Take 1 tablet by mouth 2 (two) times daily. 20 tablet 0   Blood Glucose Monitoring Suppl DEVI 1 each by Does not apply route in the morning, at noon, and at bedtime. May substitute to any manufacturer covered by patient's insurance. 1 each 0   budesonide-glycopyrrolate-formoterol (BREZTRI  AEROSPHERE) 160-9-4.8 MCG/ACT AERO inhaler Inhale 2 puffs into the  lungs in the morning and at bedtime. (Patient taking differently: Inhale 2 puffs into the lungs in the morning and at bedtime. AS NEEDED) 10.7 g 2   Continuous Glucose Sensor (DEXCOM G7 SENSOR) MISC Change every 10 days 10 each 3   dapagliflozin  propanediol (FARXIGA ) 10 MG TABS tablet Take 1 tablet (10 mg total) by mouth daily. 90 tablet 3   ezetimibe  (ZETIA ) 10 MG tablet Take 1 tablet by mouth once daily 90 tablet 0   fluticasone  (FLONASE ) 50 MCG/ACT nasal spray USE 2  SPRAY(S) IN EACH NOSTRIL ONCE DAILY AS DIRECTED 16 g 0   Glucose Blood (BLOOD GLUCOSE TEST STRIPS) STRP 1 each by In Vitro route daily. May substitute to any manufacturer covered by patient's insurance. 100 strip 3   insulin degludec  (TRESIBA  FLEXTOUCH) 200 UNIT/ML FlexTouch Pen Inject 46 Units into the skin daily. (Patient taking differently: Inject 52 Units into the skin daily.) 108 mL 0   Insulin Pen Needle (NOVOFINE PEN NEEDLE) 32G X 6 MM MISC Use new needle after every injecion 100 each 2   metFORMIN  (GLUCOPHAGE ) 1000 MG tablet Take 1 tablet by mouth twice daily with food 180 tablet 0   montelukast  (SINGULAIR ) 10 MG tablet TAKE 1 TABLET BY MOUTH AT BEDTIME 90 tablet 3   Multiple Vitamin (MULTIVITAMIN) tablet Take 1 tablet by mouth daily.     pravastatin  (PRAVACHOL ) 40 MG tablet TAKE 1 TABLET BY MOUTH ONCE DAILY AT BEDTIME 90 tablet 0   promethazine  (PHENERGAN ) 25 MG tablet Take 1 tablet (25 mg total) by mouth every 8 (eight) hours as needed for nausea or vomiting. 20 tablet 0   ramipril  (ALTACE ) 2.5 MG capsule Take 1 capsule by mouth in the morning 90 capsule 0   Semaglutide , 2 MG/DOSE, (OZEMPIC , 2 MG/DOSE,) 8 MG/3ML SOPN INJECT 2MG  UNDER THE SKIN ONE TIME WEEKLY 9 mL 1   spironolactone  (ALDACTONE ) 25 MG tablet Take 1 tablet (25 mg total) by mouth daily. 90 tablet 1   No facility-administered medications prior to visit.    Allergies  Allergen Reactions   Bactrim [Sulfamethoxazole-Trimethoprim] Swelling   Morphine And  Codeine    Zofran  [Ondansetron ] Nausea And Vomiting    Review of Systems  Constitutional:  Negative for chills, diaphoresis, fatigue and fever.  HENT:  Negative for congestion, ear pain and sinus pain.   Eyes: Negative.   Respiratory:  Negative for cough and shortness of breath.   Cardiovascular:  Negative for chest pain and palpitations.  Gastrointestinal:  Positive for nausea and vomiting. Negative for abdominal pain, constipation and diarrhea.  Endocrine: Negative.   Genitourinary:  Negative for dysuria.  Musculoskeletal:  Positive for arthralgias (bilateral hands) and joint swelling.  Skin:  Positive for color change (erythema).  Allergic/Immunologic: Negative.   Neurological:  Negative for dizziness, weakness, light-headedness and headaches.  Psychiatric/Behavioral:  Negative for dysphoric mood. The patient is not nervous/anxious.        Objective:        04/22/2024    3:13 PM 04/20/2024   11:31 AM 04/14/2024    3:39 PM  Vitals with BMI  Height 5' 1 5' 1 5' 1  Weight 211 lbs 3 oz 212 lbs 214 lbs 3 oz  BMI 39.93 40.08 40.49  Systolic 106 110 895  Diastolic 62 62 64  Pulse 84 84 69    No data found.   Physical Exam Vitals reviewed.  Constitutional:      General: She is not in acute distress.    Appearance: Normal appearance. She is obese. She is ill-appearing.  Eyes:     Conjunctiva/sclera: Conjunctivae normal.  Cardiovascular:     Rate and Rhythm: Normal rate and regular rhythm.     Heart sounds: Normal heart sounds. No murmur heard. Pulmonary:     Effort: Pulmonary effort is normal.     Breath sounds: Normal breath sounds. No wheezing.  Musculoskeletal:        General: Swelling and tenderness present.  Skin:    General: Skin is warm.  Findings: Erythema present.  Neurological:     Mental Status: She is alert. Mental status is at baseline.  Psychiatric:        Mood and Affect: Mood normal.        Behavior: Behavior normal.          Health  Maintenance Due  Topic Date Due   Zoster Vaccines- Shingrix (1 of 2) Never done   Pneumococcal Vaccine: 19-49 Years (2 of 2 - PCV) 10/08/2012   Pneumococcal Vaccine: 50+ Years (2 of 2 - PCV) 10/08/2012   COVID-19 Vaccine (6 - Moderna risk 2024-25 season) 03/03/2024   OPHTHALMOLOGY EXAM  04/15/2024   Cervical Cancer Screening (HPV/Pap Cotest)  06/06/2024    There are no preventive care reminders to display for this patient.   Lab Results  Component Value Date   TSH 2.270 04/20/2024   Lab Results  Component Value Date   WBC 13.0 (H) 04/20/2024   HGB 14.1 04/20/2024   HCT 45.2 04/20/2024   MCV 90 04/20/2024   PLT 361 04/20/2024   Lab Results  Component Value Date   NA 137 04/20/2024   K 4.6 04/20/2024   CO2 22 04/20/2024   GLUCOSE 113 (H) 04/20/2024   BUN 25 04/20/2024   CREATININE 0.72 04/20/2024   BILITOT 0.4 04/20/2024   ALKPHOS 57 04/20/2024   AST 13 04/20/2024   ALT 19 04/20/2024   PROT 6.7 04/20/2024   ALBUMIN 4.1 04/20/2024   CALCIUM 10.0 04/20/2024   EGFR 93 04/20/2024   Lab Results  Component Value Date   CHOL 115 04/01/2024   Lab Results  Component Value Date   HDL 45 04/01/2024   Lab Results  Component Value Date   LDLCALC 54 04/01/2024   Lab Results  Component Value Date   TRIG 82 04/01/2024   Lab Results  Component Value Date   CHOLHDL 2.6 04/01/2024   Lab Results  Component Value Date   HGBA1C 7.2 (H) 04/01/2024       Assessment & Plan:  Localized swelling on left hand Assessment & Plan: Recurrent hand and wrist swelling and pain, 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 Augmentin  not tolerated due to vomiting. Condition worsened with increased swelling and pain. - Contact infectious disease specialist for outpatient IV antibiotic therapy. Lab Results  Component Value Date    WBC 13.0 (H) 04/20/2024   HGB 14.1 04/20/2024   HCT 45.2 04/20/2024   MCV 90 04/20/2024   PLT 361 04/20/2024    Lab Results  Component Value Date   CRP 14 (H) 04/20/2024   Lab Results  Component Value Date   ESRSEDRATE 41 (H) 04/20/2024   Pain management for hand and wrist pain Severe hand and wrist pain. Managed with ibuprofen  and tramadol . - Continue ibuprofen  and tramadol .  - Rocephin  1g  IM in the office for possible cellulitis (unable to tolerate current medication Rx'd) - Dr. Sherre spoke with infectious disease and they directed her to get her in to see an orthopedic surgeon for possible injection. - Referral to Dr. Camella for evalation and treat versus sending her to the hospital  Orders: -     cefTRIAXone  Sodium  Pseudogout of hand, left Assessment & Plan: Possible pseudogout of the left hand with joint involvement. Redness, swelling and heat during assessment. - Colchicine  0.6 mg by mouth TWICE A DAY for 7 days. - FU  on Monday with PCP  Orders: -     Colchicine ; Take 1 tablet (0.6 mg total) by mouth 2 (two) times daily.  Dispense: 14 tablet; Refill: 0     Follow-up: Return if symptoms worsen or fail to improve.  An After Visit Summary was printed and given to the patient.  Harrie Cedar, FNP [name] [phone]

## 2024-04-23 DIAGNOSIS — M67342 Transient synovitis, left hand: Secondary | ICD-10-CM | POA: Diagnosis not present

## 2024-04-23 DIAGNOSIS — M67341 Transient synovitis, right hand: Secondary | ICD-10-CM | POA: Diagnosis not present

## 2024-04-23 DIAGNOSIS — M67332 Transient synovitis, left wrist: Secondary | ICD-10-CM | POA: Diagnosis not present

## 2024-04-23 DIAGNOSIS — M67331 Transient synovitis, right wrist: Secondary | ICD-10-CM | POA: Diagnosis not present

## 2024-04-25 NOTE — Progress Notes (Signed)
 Subjective:  Patient ID: Brandi Velazquez, female    DOB: 05-Jan-1961  Age: 63 y.o. MRN: [id number]  No chief complaint on file.   HPI:     05/29/2023    9:32 AM 02/08/2023    9:38 AM 02/05/2022    3:16 PM 07/18/2021   11:21 AM 07/18/2020   10:09 AM  Depression screen PHQ 2/9  Decreased Interest 0 0 0 0 0  Down, Depressed, Hopeless 0 0 0 0 0  PHQ - 2 Score 0 0 0 0 0  Altered sleeping 0 0     Tired, decreased energy 1 0     Change in appetite 1 1     Feeling bad or failure about yourself  0 0     Trouble concentrating 0 0     Moving slowly or fidgety/restless 0 0     Suicidal thoughts 0 0     PHQ-9 Score 2 1     Difficult doing work/chores Not difficult at all Not difficult at all           05/29/2023    9:31 AM  Fall Risk   Falls in the past year? 1  Number falls in past yr: 1  Injury with Fall? 1  Risk for fall due to : No Fall Risks  Follow up Falls evaluation completed;Falls prevention discussed    Patient Care Team: Sherre Clapper, MD as PCP - General (Family Medicine) Kennedy, Nathan K, [facility] (Inactive) (Pharmacist)   Review of Systems  Current Outpatient Medications on File Prior to Visit  Medication Sig Dispense Refill   amoxicillin -clavulanate (AUGMENTIN ) 875-125 MG tablet Take 1 tablet by mouth 2 (two) times daily. 20 tablet 0   Blood Glucose Monitoring Suppl DEVI 1 each by Does not apply route in the morning, at noon, and at bedtime. May substitute to any manufacturer covered by patient's insurance. 1 each 0   budesonide-glycopyrrolate-formoterol (BREZTRI  AEROSPHERE) 160-9-4.8 MCG/ACT AERO inhaler Inhale 2 puffs into the lungs in the morning and at bedtime. (Patient taking differently: Inhale 2 puffs into the lungs in the morning and at bedtime. AS NEEDED) 10.7 g 2   colchicine  0.6 MG tablet Take 1 tablet (0.6 mg total) by mouth 2 (two) times daily. 14 tablet 0   Continuous Glucose Sensor (DEXCOM G7 SENSOR) MISC Change every 10 days 10 each 3   dapagliflozin   propanediol (FARXIGA ) 10 MG TABS tablet Take 1 tablet (10 mg total) by mouth daily. 90 tablet 3   ezetimibe  (ZETIA ) 10 MG tablet Take 1 tablet by mouth once daily 90 tablet 0   fluticasone  (FLONASE ) 50 MCG/ACT nasal spray USE 2 SPRAY(S) IN EACH NOSTRIL ONCE DAILY AS DIRECTED 16 g 0   Glucose Blood (BLOOD GLUCOSE TEST STRIPS) STRP 1 each by In Vitro route daily. May substitute to any manufacturer covered by patient's insurance. 100 strip 3   insulin degludec  (TRESIBA  FLEXTOUCH) 200 UNIT/ML FlexTouch Pen Inject 46 Units into the skin daily. (Patient taking differently: Inject 52 Units into the skin daily.) 108 mL 0   Insulin Pen Needle (NOVOFINE PEN NEEDLE) 32G X 6 MM MISC Use new needle after every injecion 100 each 2   metFORMIN  (GLUCOPHAGE ) 1000 MG tablet Take 1 tablet by mouth twice daily with food 180 tablet 0   montelukast  (SINGULAIR ) 10 MG tablet TAKE 1 TABLET BY MOUTH AT BEDTIME 90 tablet 3   Multiple Vitamin (MULTIVITAMIN) tablet Take 1 tablet by mouth daily.     pravastatin  (PRAVACHOL )  40 MG tablet TAKE 1 TABLET BY MOUTH ONCE DAILY AT BEDTIME 90 tablet 0   promethazine  (PHENERGAN ) 25 MG tablet Take 1 tablet (25 mg total) by mouth every 8 (eight) hours as needed for nausea or vomiting. 20 tablet 0   ramipril  (ALTACE ) 2.5 MG capsule Take 1 capsule by mouth in the morning 90 capsule 0   Semaglutide , 2 MG/DOSE, (OZEMPIC , 2 MG/DOSE,) 8 MG/3ML SOPN INJECT 2MG  UNDER THE SKIN ONE TIME WEEKLY 9 mL 1   spironolactone  (ALDACTONE ) 25 MG tablet Take 1 tablet (25 mg total) by mouth daily. 90 tablet 1   No current facility-administered medications on file prior to visit.   Past Medical History:  Diagnosis Date   Ankle pain    Back pain of lumbar region with sciatica 01/07/2020   Chronic pain    back   COPD (chronic obstructive pulmonary disease) (HCC)    Depression    Diabetes mellitus    GERD (gastroesophageal reflux disease)    Hyperlipidemia    Hypertension    Low back pain    Obstructive  sleep apnea    Pneumonia 2010   Post-menopausal atrophic vaginitis    Superficial phlebitis    Past Surgical History:  Procedure Laterality Date   BACK SURGERY  1990   CESAREAN SECTION     CYST REMOVAL HAND     inner thigh   REFRACTIVE SURGERY Right 06/07/2022   SPINE SURGERY     lumbar laminectomy and diskectomy    Family History  Problem Relation Age of Onset   Diabetes Mother    Heart disease Mother    Cancer Father    Diabetes Sister    Stroke Sister    Lung disease Neg Hx    Social History   Socioeconomic History   Marital status: Single    Spouse name: Not on file   Number of children: Not on file   Years of education: Not on file   Highest education level: Not on file  Occupational History   Not on file  Tobacco Use   Smoking status: Former    Current packs/day: 0.00    Average packs/day: 1.5 packs/day for 15.0 years (22.5 ttl pk-yrs)    Types: Cigarettes    Start date: 03/20/1994    Quit date: 03/20/2009    Years since quitting: 15.1   Smokeless tobacco: Never  Vaping Use   Vaping status: Never Used  Substance and Sexual Activity   Alcohol use: No   Drug use: No   Sexual activity: Not Currently    Partners: Male  Other Topics Concern   Not on file  Social History Narrative   Not on file   Social Drivers of Health   Financial Resource Strain: High Risk (08/15/2022)   Overall Financial Resource Strain (CARDIA)    Difficulty of Paying Living Expenses: Hard  Food Insecurity: No Food Insecurity (12/04/2023)   Hunger Vital Sign    Worried About Running Out of Food in the Last Year: Never true    Ran Out of Food in the Last Year: Never true  Transportation Needs: No Transportation Needs (12/04/2023)   PRAPARE - Administrator, Civil Service (Medical): No    Lack of Transportation (Non-Medical): No  Physical Activity: Inactive (05/29/2023)   Exercise Vital Sign    Days of Exercise per Week: 0 days    Minutes of Exercise per Session: 0 min   Stress: No Stress Concern Present (05/29/2023)   [place]  [employer]ational [employer] - Occupational Stress Questionnaire    Feeling of Stress : Not at all  Social Connections: Moderately Isolated (05/29/2023)   Social Connection and Isolation Panel    Frequency of Communication with Friends and Family: More than three times a week    Frequency of Social Gatherings with Friends and Family: More than three times a week    Attends Religious Services: More than 4 times per year    Active Member of [employer] or Organizations: No    Attends Engineer, structural: Not on file    Marital Status: Divorced    Objective:  There were no vitals taken for this visit.     04/22/2024    3:13 PM 04/20/2024   11:31 AM 04/14/2024    3:39 PM  BP/Weight  Systolic BP 106 110 104  Diastolic BP 62 62 64  Wt. (Lbs) 211.2 212 214.2  BMI 39.91 kg/m2 40.06 kg/m2 40.47 kg/m2    Physical Exam  {Perform Simple Foot Exam  Perform Detailed exam:1} {Insert foot Exam (Optional):30965}   Lab Results  Component Value Date   WBC 13.0 (H) 04/20/2024   HGB 14.1 04/20/2024   HCT 45.2 04/20/2024   PLT 361 04/20/2024   GLUCOSE 113 (H) 04/20/2024   CHOL 115 04/01/2024   TRIG 82 04/01/2024   HDL 45 04/01/2024   LDLCALC 54 04/01/2024   ALT 19 04/20/2024   AST 13 04/20/2024   NA 137 04/20/2024   K 4.6 04/20/2024   CL 100 04/20/2024   CREATININE 0.72 04/20/2024   BUN 25 04/20/2024   CO2 22 04/20/2024   TSH 2.270 04/20/2024   HGBA1C 7.2 (H) 04/01/2024   MICROALBUR 30 05/18/2021      Assessment & Plan:  There are no diagnoses linked to this encounter.   No orders of the defined types were placed in this encounter.   No orders of the defined types were placed in this encounter.    Follow-up: No follow-ups on file.   I,Marla I Leal-Borjas,acting as a scribe for Abigail Free, MD.,have documented all relevant documentation on the behalf of Abigail Free, MD,as directed by  Abigail Free, MD while in  the presence of Abigail Free, MD.   An After Visit Summary was printed and given to the patient.  Abigail Free, MD [facility] [phone]

## 2024-04-27 ENCOUNTER — Ambulatory Visit (INDEPENDENT_AMBULATORY_CARE_PROVIDER_SITE_OTHER): Admitting: Family Medicine

## 2024-04-27 VITALS — BP 110/60 | HR 83 | Temp 98.0°F | Ht 61.0 in | Wt 213.0 lb

## 2024-04-27 DIAGNOSIS — M353 Polymyalgia rheumatica: Secondary | ICD-10-CM | POA: Diagnosis not present

## 2024-04-27 DIAGNOSIS — R2232 Localized swelling, mass and lump, left upper limb: Secondary | ICD-10-CM

## 2024-04-27 DIAGNOSIS — M11242 Other chondrocalcinosis, left hand: Secondary | ICD-10-CM

## 2024-04-27 MED ORDER — PREDNISONE 20 MG PO TABS: 20.0000 mg | ORAL_TABLET | Freq: Every day | ORAL | 0 refills | Status: DC

## 2024-04-27 NOTE — Patient Instructions (Signed)
 Increase prednisone  to 20 mg daily until sees dr. Camella.   Return for labs

## 2024-04-28 ENCOUNTER — Encounter: Payer: Self-pay | Admitting: Family Medicine

## 2024-04-28 DIAGNOSIS — M353 Polymyalgia rheumatica: Secondary | ICD-10-CM | POA: Insufficient documentation

## 2024-04-28 NOTE — Assessment & Plan Note (Signed)
 Mild improvement since started prednisone  4 days ago. Recommend increase prednisone  to 20 mg daily until sees dr. Camella later this week. .   Return for labs - repeat esr and crp mid week.

## 2024-04-29 ENCOUNTER — Other Ambulatory Visit

## 2024-04-29 DIAGNOSIS — E1142 Type 2 diabetes mellitus with diabetic polyneuropathy: Secondary | ICD-10-CM | POA: Diagnosis not present

## 2024-04-30 DIAGNOSIS — M7989 Other specified soft tissue disorders: Secondary | ICD-10-CM | POA: Diagnosis not present

## 2024-04-30 DIAGNOSIS — M67342 Transient synovitis, left hand: Secondary | ICD-10-CM | POA: Diagnosis not present

## 2024-04-30 DIAGNOSIS — Z794 Long term (current) use of insulin: Secondary | ICD-10-CM | POA: Diagnosis not present

## 2024-04-30 DIAGNOSIS — M67332 Transient synovitis, left wrist: Secondary | ICD-10-CM | POA: Diagnosis not present

## 2024-04-30 DIAGNOSIS — M67341 Transient synovitis, right hand: Secondary | ICD-10-CM | POA: Diagnosis not present

## 2024-04-30 DIAGNOSIS — E119 Type 2 diabetes mellitus without complications: Secondary | ICD-10-CM | POA: Diagnosis not present

## 2024-04-30 DIAGNOSIS — M67331 Transient synovitis, right wrist: Secondary | ICD-10-CM | POA: Diagnosis not present

## 2024-04-30 DIAGNOSIS — M353 Polymyalgia rheumatica: Secondary | ICD-10-CM | POA: Diagnosis not present

## 2024-04-30 LAB — COMPREHENSIVE METABOLIC PANEL WITH GFR
ALT: 18 IU/L (ref 0–32)
AST: 13 IU/L (ref 0–40)
Albumin: 3.9 g/dL (ref 3.9–4.9)
Alkaline Phosphatase: 62 IU/L (ref 44–121)
BUN/Creatinine Ratio: 23 (ref 12–28)
BUN: 24 mg/dL (ref 8–27)
Bilirubin Total: <0.2 mg/dL (ref 0.0–1.2)
CO2: 19 mmol/L — ABNORMAL LOW (ref 20–29)
Calcium: 9.4 mg/dL (ref 8.7–10.3)
Chloride: 103 mmol/L (ref 96–106)
Creatinine, Ser: 1.06 mg/dL — ABNORMAL HIGH (ref 0.57–1.00)
Globulin, Total: 2.4 g/dL (ref 1.5–4.5)
Glucose: 201 mg/dL — ABNORMAL HIGH (ref 70–99)
Potassium: 4.9 mmol/L (ref 3.5–5.2)
Sodium: 140 mmol/L (ref 134–144)
Total Protein: 6.3 g/dL (ref 6.0–8.5)
eGFR: 59 mL/min/1.73 — ABNORMAL LOW (ref >59)

## 2024-05-01 ENCOUNTER — Ambulatory Visit: Payer: Self-pay | Admitting: Family Medicine

## 2024-05-07 ENCOUNTER — Telehealth: Payer: Self-pay

## 2024-05-07 NOTE — Telephone Encounter (Signed)
 Completed reorder form for [employer] for Ozempic  and faxed to Dr. Abigail Free at to review and sign and return to [employer] at [phone]

## 2024-05-07 NOTE — Telephone Encounter (Signed)
 Completed forms faxed to [employer]

## 2024-05-15 ENCOUNTER — Telehealth: Payer: Self-pay

## 2024-05-15 NOTE — Telephone Encounter (Signed)
 Called patient to informed that we received patient assistance of ozempic  8 mg from patient assistance. She will come by next week.

## 2024-05-26 ENCOUNTER — Other Ambulatory Visit: Payer: Self-pay | Admitting: Family Medicine

## 2024-05-26 ENCOUNTER — Other Ambulatory Visit: Payer: Self-pay

## 2024-05-26 DIAGNOSIS — J020 Streptococcal pharyngitis: Secondary | ICD-10-CM

## 2024-05-29 ENCOUNTER — Other Ambulatory Visit: Payer: Self-pay | Admitting: Family Medicine

## 2024-05-29 DIAGNOSIS — E782 Mixed hyperlipidemia: Secondary | ICD-10-CM

## 2024-05-29 MED ORDER — PRAVASTATIN SODIUM 40 MG PO TABS: 40.0000 mg | ORAL_TABLET | Freq: Every day | ORAL | 0 refills | Status: DC

## 2024-05-29 MED ORDER — RAMIPRIL 2.5 MG PO CAPS: 2.5000 mg | ORAL_CAPSULE | Freq: Every morning | ORAL | 0 refills | Status: DC

## 2024-05-29 MED ORDER — EZETIMIBE 10 MG PO TABS: 10.0000 mg | ORAL_TABLET | Freq: Every day | ORAL | 0 refills | Status: DC

## 2024-05-29 NOTE — Telephone Encounter (Signed)
 Copied from CRM #[id number]. Topic: Clinical - Medication Refill >> May 29, 2024  9:26 AM Tiffini S wrote: Medication: pravastatin  (PRAVACHOL ) 40 MG tablet,  ezetimibe  (ZETIA ) 10 MG tablet, ramipril  (ALTACE ) 2.5 MG capsule   Has the patient contacted their pharmacy? Yes (Agent: If no, request that the patient contact the pharmacy for the refill. If patient does not wish to contact the pharmacy document the reason why and proceed with request.) (Agent: If yes, when and what did the pharmacy advise?)  This is the patient's preferred pharmacy:  [facility] [address], [place] - [address] [facility] [place] [address] Phone: [phone] Fax: [phone]  Is this the correct pharmacy for this prescription? Yes If no, delete pharmacy and type the correct one.   Has the prescription been filled recently? Yes  Is the patient out of the medication? Yes, will be out of town tomorrow and will run out before she is back   Has the patient been seen for an appointment in the last year OR does the patient have an upcoming appointment? Yes  Can we respond through MyChart? Yes  Agent: Please be advised that Rx refills may take up to 3 business days. We ask that you follow-up with your pharmacy.

## 2024-06-08 ENCOUNTER — Ambulatory Visit (INDEPENDENT_AMBULATORY_CARE_PROVIDER_SITE_OTHER)

## 2024-06-08 VITALS — Wt 210.0 lb

## 2024-06-08 DIAGNOSIS — Z0000 Encounter for general adult medical examination without abnormal findings: Secondary | ICD-10-CM

## 2024-06-08 DIAGNOSIS — Z1231 Encounter for screening mammogram for malignant neoplasm of breast: Secondary | ICD-10-CM

## 2024-06-08 DIAGNOSIS — Z9189 Other specified personal risk factors, not elsewhere classified: Secondary | ICD-10-CM

## 2024-06-08 NOTE — Progress Notes (Signed)
 Subjective:   Brandi Velazquez is a 63 y.o. female who presents for Medicare Annual (Subsequent) preventive examination.  This wellness visit is conducted by a nurse.  The patient's medications were reviewed and reconciled since the patient's last visit.  History details were provided by the patient.  The history appears to be reliable.    Patient's last AWV was one year ago.   Medical History: Patient history and Family history was reviewed  Medications, Allergies, and preventative health maintenance was reviewed and updated.   Visit Complete: Virtual I connected with  Brandi Velazquez on 06/08/24 by a audio enabled telemedicine application and verified that I am speaking with the correct person using two identifiers.  Patient Location: Home  Provider Location: Office/Clinic  I discussed the limitations of evaluation and management by telemedicine. The patient expressed understanding and agreed to proceed.  Vital Signs: Because this visit was a virtual/telehealth visit, some criteria may be missing or patient reported. Any vitals not documented were not able to be obtained and vitals that have been documented are patient reported.  Cardiac Risk Factors include: diabetes mellitus;dyslipidemia     Objective:    Today's Vitals   06/08/24 1406  Weight: 210 lb (95.3 kg)  PainSc: 4   PainLoc: Back  Patient was unable to self-report due to a lack of equipment at home via telehealth  Body mass index is 39.68 kg/m.     05/29/2023   10:08 AM 07/13/2016   11:06 AM  Advanced Directives  Does Patient Have a Medical Advance Directive? No No   Would patient like information on creating a medical advance directive? No - Patient declined No - patient declined information      Data saved with a previous flowsheet row definition    Current Medications (verified) Outpatient Encounter Medications as of 06/08/2024  Medication Sig   Blood Glucose Monitoring Suppl DEVI 1 each by Does not  apply route in the morning, at noon, and at bedtime. May substitute to any manufacturer covered by patient's insurance.   budesonide-glycopyrrolate-formoterol (BREZTRI  AEROSPHERE) 160-9-4.8 MCG/ACT AERO inhaler Inhale 2 puffs into the lungs in the morning and at bedtime. (Patient taking differently: Inhale 2 puffs into the lungs in the morning and at bedtime. AS NEEDED)   Continuous Glucose Sensor (DEXCOM G7 SENSOR) MISC Change every 10 days   dapagliflozin  propanediol (FARXIGA ) 10 MG TABS tablet Take 1 tablet (10 mg total) by mouth daily.   ezetimibe  (ZETIA ) 10 MG tablet Take 1 tablet (10 mg total) by mouth daily.   fluticasone  (FLONASE ) 50 MCG/ACT nasal spray USE 2 SPRAY(S) IN EACH NOSTRIL ONCE DAILY AS DIRECTED   Glucose Blood (BLOOD GLUCOSE TEST STRIPS) STRP 1 each by In Vitro route daily. May substitute to any manufacturer covered by patient's insurance.   insulin degludec  (TRESIBA  FLEXTOUCH) 200 UNIT/ML FlexTouch Pen Inject 46 Units into the skin daily. (Patient taking differently: Inject 56 Units into the skin daily.)   Insulin Pen Needle (NOVOFINE PEN NEEDLE) 32G X 6 MM MISC Use new needle after every injecion   metFORMIN  (GLUCOPHAGE ) 1000 MG tablet Take 1 tablet by mouth twice daily with food   montelukast  (SINGULAIR ) 10 MG tablet TAKE 1 TABLET BY MOUTH AT BEDTIME   Multiple Vitamin (MULTIVITAMIN) tablet Take 1 tablet by mouth daily.   pravastatin  (PRAVACHOL ) 40 MG tablet Take 1 tablet (40 mg total) by mouth at bedtime.   predniSONE  (DELTASONE ) 10 MG tablet Take 10 mg by mouth daily.   predniSONE  (  DELTASONE ) 20 MG tablet Take 1 tablet (20 mg total) by mouth daily with breakfast.   predniSONE  (DELTASONE ) 5 MG tablet Take 5 mg by mouth daily.   promethazine  (PHENERGAN ) 25 MG tablet TAKE 1 TABLET BY MOUTH EVERY 8 HOURS AS NEEDED FOR NAUSEA FOR VOMITING   ramipril  (ALTACE ) 2.5 MG capsule Take 1 capsule (2.5 mg total) by mouth every morning.   Semaglutide , 2 MG/DOSE, (OZEMPIC , 2 MG/DOSE,) 8  MG/3ML SOPN INJECT 2MG  UNDER THE SKIN ONE TIME WEEKLY   spironolactone  (ALDACTONE ) 25 MG tablet Take 1 tablet (25 mg total) by mouth daily.   No facility-administered encounter medications on file as of 06/08/2024.    Allergies (verified) Bactrim [sulfamethoxazole-trimethoprim], Morphine and codeine, and Zofran  [ondansetron ]   History: Past Medical History:  Diagnosis Date   Ankle pain    Back pain of lumbar region with sciatica 01/07/2020   Chronic pain    back   COPD (chronic obstructive pulmonary disease) (HCC)    Depression    Diabetes mellitus    GERD (gastroesophageal reflux disease)    Hyperlipidemia    Hypertension    Low back pain    Obstructive sleep apnea    Pneumonia 2010   Post-menopausal atrophic vaginitis    Superficial phlebitis    Past Surgical History:  Procedure Laterality Date   BACK SURGERY  1990   CESAREAN SECTION     CYST REMOVAL HAND     inner thigh   REFRACTIVE SURGERY Right 06/07/2022   SPINE SURGERY     lumbar laminectomy and diskectomy   Family History  Problem Relation Age of Onset   Diabetes Mother    Heart disease Mother    Cancer Father    Diabetes Sister    Stroke Sister    Lung disease Neg Hx    Social History   Socioeconomic History   Marital status: Single    Spouse name: Not on file   Number of children: Not on file   Years of education: Not on file   Highest education level: Not on file  Occupational History   Not on file  Tobacco Use   Smoking status: Former    Current packs/day: 0.00    Average packs/day: 1.5 packs/day for 15.0 years (22.5 ttl pk-yrs)    Types: Cigarettes    Start date: 03/20/1994    Quit date: 03/20/2009    Years since quitting: 15.2   Smokeless tobacco: Never  Vaping Use   Vaping status: Never Used  Substance and Sexual Activity   Alcohol use: No   Drug use: No   Sexual activity: Not Currently    Partners: Male  Other Topics Concern   Not on file  Social History Narrative   Not on file    Social Drivers of Health   Financial Resource Strain: Medium Risk (06/08/2024)   Overall Financial Resource Strain (CARDIA)    Difficulty of Paying Living Expenses: Somewhat hard  Food Insecurity: No Food Insecurity (12/04/2023)   Hunger Vital Sign    Worried About Running Out of Food in the Last Year: Never true    Ran Out of Food in the Last Year: Never true  Transportation Needs: No Transportation Needs (12/04/2023)   PRAPARE - Administrator, Civil Service (Medical): No    Lack of Transportation (Non-Medical): No  Physical Activity: Insufficiently Active (06/08/2024)   Exercise Vital Sign    Days of Exercise per Week: 4 days    Minutes of Exercise  per Session: 30 min  Stress: No Stress Concern Present (06/08/2024)   [employer] of Occupational Health - Occupational Stress Questionnaire    Feeling of Stress: Not at all  Social Connections: Moderately Isolated (05/29/2023)   Social Connection and Isolation Panel    Frequency of Communication with Friends and Family: More than three times a week    Frequency of Social Gatherings with Friends and Family: More than three times a week    Attends Religious Services: More than 4 times per year    Active Member of [employer] or Organizations: No    Attends Engineer, structural: Not on file    Marital Status: Divorced    Tobacco Counseling Counseling given: Not Answered   Clinical Intake:  Pre-visit preparation completed: Yes  Pain : 0-10 Pain Score: 4  Pain Location: Back Pain Orientation: Lower     Nutritional Status: BMI > 30  Obese Nutritional Risks: None Diabetes: Yes (Most recent A1C 7.2) CBG done?: No Did pt. bring in CBG monitor from home?: Yes (Patient taking prednisone , getting frequent readings >300 and sometimes meter reading HIGH)  How often do you need to have someone help you when you read instructions, pamphlets, or other written materials from your doctor or pharmacy?: 1 -  Never  Interpreter Needed?: No      Activities of Daily Living    06/08/2024    2:12 PM  In your present state of health, do you have any difficulty performing the following activities:  Hearing? 0  Vision? 0  Difficulty concentrating or making decisions? 0  Walking or climbing stairs? 0  Dressing or bathing? 0  Doing errands, shopping? 0  Preparing Food and eating ? N  Using the Toilet? N  In the past six months, have you accidently leaked urine? N  Do you have problems with loss of bowel control? N  Managing your Medications? N  Managing your Finances? N  Housekeeping or managing your Housekeeping? N    Patient Care Team: Sherre Clapper, MD as PCP - General (Family Medicine) Camella Fallow, MD as Consulting Physician (Orthopedic Surgery)  Indicate any recent Medical Services you may have received from other than [facility] providers in the past year (date may be approximate).     Assessment:   This is a routine wellness examination for [place].  Hearing/Vision screen No results found.   Goals Addressed   None    Depression Screen    06/08/2024    2:11 PM 05/29/2023    9:32 AM 02/08/2023    9:38 AM 02/05/2022    3:16 PM 07/18/2021   11:21 AM 07/18/2020   10:09 AM 07/13/2016   10:55 AM  PHQ 2/9 Scores  PHQ - 2 Score 0 0 0 0 0 0 0  PHQ- 9 Score  2 1        Fall Risk    06/08/2024    1:55 PM 05/29/2023    9:31 AM 02/08/2023    9:37 AM 10/11/2022    9:55 AM 02/05/2022    3:13 PM  Fall Risk   Falls in the past year? 0 1 1 0 0  Number falls in past yr: 0 1 0 0 0  Injury with Fall? 0 1 1 0 0  Risk for fall due to : No Fall Risks No Fall Risks No Fall Risks No Fall Risks   Follow up Falls evaluation completed Falls evaluation completed;Falls prevention discussed Falls evaluation completed;Falls prevention discussed  MEDICARE RISK AT HOME: Medicare Risk at Home Any stairs in or around the home?: Yes If so, are there any without handrails?: No Home free of loose  throw rugs in walkways, pet beds, electrical cords, etc?: No Adequate lighting in your home to reduce risk of falls?: Yes Life alert?: No Use of a cane, walker or w/c?: No Grab bars in the bathroom?: Yes Shower chair or bench in shower?: Yes Elevated toilet seat or a handicapped toilet?: No  TIMED UP AND GO:  Was the test performed?  No    Cognitive Function:        06/08/2024    2:12 PM 02/05/2022    3:17 PM  6CIT Screen  What Year? 0 points   What month? 0 points   What time? 0 points 0 points  Count back from 20 0 points 0 points  Months in reverse 0 points 0 points  Repeat phrase 2 points 6 points  Total Score 2 points     Immunizations Immunization History  Administered Date(s) Administered   Influenza Inj Mdck Quad Pf 07/18/2020, 05/18/2021, 06/11/2022   Influenza, Mdck, Trivalent,PF 6+ MOS(egg free) 05/29/2023   Moderna Covid-19 Vaccine Bivalent Booster 28yrs & up 08/22/2021   Moderna SARS-COV2 Booster Vaccination 02/09/2021   Moderna Sars-Covid-2 Vaccination 10/07/2019, 11/02/2019   PPD Test 05/29/2023   Pfizer(Comirnaty)Fall Seasonal Vaccine 12 years and older 10/11/2022, 09/03/2023   Pneumococcal Polysaccharide-23 10/09/2011   Tdap 05/15/2018    TDAP status: Up to date  Flu Vaccine status: Due, Education has been provided regarding the importance of this vaccine. Advised may receive this vaccine at local pharmacy or Health Dept. Aware to provide a copy of the vaccination record if obtained from local pharmacy or Health Dept. Verbalized acceptance and understanding.  Pneumococcal vaccine status: Due, Education has been provided regarding the importance of this vaccine. Advised may receive this vaccine at local pharmacy or Health Dept. Aware to provide a copy of the vaccination record if obtained from local pharmacy or Health Dept. Verbalized acceptance and understanding.  Covid-19 vaccine status: Information provided on how to obtain vaccines.   Qualifies for  Shingles Vaccine? Yes   Zostavax completed No   Shingrix Completed?: No.    Education has been provided regarding the importance of this vaccine. Patient has been advised to call insurance company to determine out of pocket expense if they have not yet received this vaccine. Advised may also receive vaccine at local pharmacy or Health Dept. Verbalized acceptance and understanding.  Screening Tests Health Maintenance  Topic Date Due   Zoster Vaccines- Shingrix (1 of 2) Never done   Pneumococcal Vaccine: 50+ Years (2 of 2 - PCV) 10/08/2012   OPHTHALMOLOGY EXAM  04/15/2024   COVID-19 Vaccine (6 - Moderna risk 2024-25 season) 05/18/2024   Cervical Cancer Screening (HPV/Pap Cotest)  06/06/2024   Mammogram  07/11/2024   DEXA SCAN  03/24/2025 (Originally 06-29-1961)   Influenza Vaccine  07/16/2025 (Originally 04/17/2024)   HEMOGLOBIN A1C  10/02/2024   Diabetic kidney evaluation - Urine ACR  12/03/2024   FOOT EXAM  12/03/2024   Medicare Annual Wellness (AWV)  12/07/2024   Diabetic kidney evaluation - eGFR measurement  04/29/2025   Colonoscopy  01/02/2027   DTaP/Tdap/Td (2 - Td or Tdap) 05/15/2028   Hepatitis B Vaccines 19-59 Average Risk  Aged Out   HPV VACCINES  Aged Out   Meningococcal B Vaccine  Aged Out   Lung Cancer Screening  Discontinued   Hepatitis C Screening  Discontinued   HIV Screening  Discontinued    Health Maintenance  Health Maintenance Due  Topic Date Due   Zoster Vaccines- Shingrix (1 of 2) Never done   Pneumococcal Vaccine: 50+ Years (2 of 2 - PCV) 10/08/2012   OPHTHALMOLOGY EXAM  04/15/2024   COVID-19 Vaccine (6 - Moderna risk 2024-25 season) 05/18/2024   Cervical Cancer Screening (HPV/Pap Cotest)  06/06/2024   Mammogram  07/11/2024    Colorectal cancer screening: Type of screening: Colonoscopy. Completed 2018. Repeat every 10 years  Mammogram status: Ordered to be done at [facility] Pt provided with contact info and advised to call to schedule appt.    Bone Density status: Ordered to be done at [facility]. Pt provided with contact info and advised to call to schedule appt.  Lung Cancer Screening: (Low Dose CT Chest recommended if Age 26-80 years, 20 pack-year currently smoking OR have quit w/in 15years.) does not qualify.   Lung Cancer Screening Referral: N/A  Additional Screening:  Vision Screening: Recommended annual ophthalmology exams for early detection of glaucoma and other disorders of the eye. Is the patient up to date with their annual eye exam?  Yes  Who is the provider or what is the name of the office in which the patient attends annual eye exams? [facility]  Dental Screening: Recommended annual dental exams for proper oral hygiene  Community Resource Referral / Chronic Care Management: CRR required this visit?  No   CCM required this visit?  No     Plan:    1- Patient will get vaccines once she completes Prednisone  2- Ozempic  refill sent to [facility] for patient assistance refill 3- Will notify provider of high blood sugar readings as well as yeast 4- Mammogram & DEXA ordered to be done at [facility] 5- Requested last eye exam from [place]  I have personally reviewed and noted the following in the patient's chart:   Medical and social history Use of alcohol, tobacco or illicit drugs  Current medications and supplements including opioid prescriptions.  Functional ability and status Nutritional status Physical activity Advanced directives List of other physicians Hospitalizations, surgeries, and ER visits in previous 12 months Vitals Screenings to include cognitive, depression, and falls Referrals and appointments  In addition, I have reviewed and discussed with patient certain preventive protocols, quality metrics, and best practice recommendations. A written personalized care plan for preventive services as well as general preventive health recommendations were provided to patient.      Suzen CHRISTELLA Sharps, LPN   [id number]   After Visit Summary: (MyChart) Due to this being a telephonic visit, the after visit summary with patients personalized plan was offered to patient via MyChart

## 2024-06-08 NOTE — Patient Instructions (Signed)
 Brandi Velazquez , Thank you for taking time to come for your Medicare Wellness Visit. I appreciate your ongoing commitment to your health goals. Please review the following plan we discussed and let me know if I can assist you in the future.    This is a list of the screening recommended for you and due dates:  Health Maintenance  Topic Date Due   Zoster (Shingles) Vaccine (1 of 2) Never done   Pneumococcal Vaccine for age over 17 (2 of 2 - PCV) 10/08/2012   Eye exam for diabetics  04/15/2024   COVID-19 Vaccine (6 - Moderna risk 2024-25 season) 05/18/2024   Pap with HPV screening  06/06/2024   Breast Cancer Screening  07/11/2024   DEXA scan (bone density measurement)  03/24/2025*   Flu Shot  07/16/2025*   Hemoglobin A1C  10/02/2024   Yearly kidney health urinalysis for diabetes  12/03/2024   Complete foot exam   12/03/2024   Yearly kidney function blood test for diabetes  04/29/2025   Medicare Annual Wellness Visit  06/08/2025   Colon Cancer Screening  01/02/2027   DTaP/Tdap/Td vaccine (2 - Td or Tdap) 05/15/2028   Hepatitis B Vaccine  Aged Out   HPV Vaccine  Aged Out   Meningitis B Vaccine  Aged Out   Screening for Lung Cancer  Discontinued   Hepatitis C Screening  Discontinued   HIV Screening  Discontinued  *Topic was postponed. The date shown is not the original due date.     Preventive Care 40-64 Years, Female Preventive care refers to lifestyle choices and visits with your health care provider that can promote health and wellness. What does preventive care include? A yearly physical exam. This is also called an annual well check. Dental exams once or twice a year. Routine eye exams. Ask your health care provider how often you should have your eyes checked. Personal lifestyle choices, including: Daily care of your teeth and gums. Regular physical activity. Eating a healthy diet. Avoiding tobacco and drug use. Limiting alcohol use. Practicing safe sex. Taking low-dose  aspirin daily starting at age 35. Taking vitamin and mineral supplements as recommended by your health care provider. What happens during an annual well check? The services and screenings done by your health care provider during your annual well check will depend on your age, overall health, lifestyle risk factors, and family history of disease. Counseling  Your health care provider may ask you questions about your: Alcohol use. Tobacco use. Drug use. Emotional well-being. Home and relationship well-being. Sexual activity. Eating habits. Work and work Astron[employer]. Method of birth control. Menstrual cycle. Pregnancy history. Screening  You may have the following tests or measurements: Height, weight, and BMI. Blood pressure. Lipid and cholesterol levels. These may be checked every 5 years, or more frequently if you are over 29 years old. Skin check. Lung cancer screening. You may have this screening every year starting at age 33 if you have a 30-pack-year history of smoking and currently smoke or have quit within the past 15 years. Fecal occult blood test (FOBT) of the stool. You may have this test every year starting at age 64. Flexible sigmoidoscopy or colonoscopy. You may have a sigmoidoscopy every 5 years or a colonoscopy every 10 years starting at age 35. Hepatitis C blood test. Hepatitis B blood test. Sexually transmitted disease (STD) testing. Diabetes screening. This is done by checking your blood sugar (glucose) after you have not eaten for a while (fasting). You may have  this done every 1-3 years. Mammogram. This may be done every 1-2 years. Talk to your health care provider about when you should start having regular mammograms. This may depend on whether you have a family history of breast cancer. BRCA-related cancer screening. This may be done if you have a family history of breast, ovarian, tubal, or peritoneal cancers. Pelvic exam and Pap test. This may be done every 3  years starting at age 85. Starting at age 65, this may be done every 5 years if you have a Pap test in combination with an HPV test. Bone density scan. This is done to screen for osteoporosis. You may have this scan if you are at high risk for osteoporosis. Discuss your test results, treatment options, and if necessary, the need for more tests with your health care provider. Vaccines  Your health care provider may recommend certain vaccines, such as: Influenza vaccine. This is recommended every year. Tetanus, diphtheria, and acellular pertussis (Tdap, Td) vaccine. You may need a Td booster every 10 years. Zoster vaccine. You may need this after age 36. Pneumococcal 13-valent conjugate (PCV13) vaccine. You may need this if you have certain conditions and were not previously vaccinated. Pneumococcal polysaccharide (PPSV23) vaccine. You may need one or two doses if you smoke cigarettes or if you have certain conditions. Talk to your health care provider about which screenings and vaccines you need and how often you need them. This information is not intended to replace advice given to you by your health care provider. Make sure you discuss any questions you have with your health care provider. Document Released: 09/30/2015 Document Revised: 05/23/2016 Document Reviewed: 07/05/2015 Elsevier Interactive Patient Education  2017 [employer].    Fall Prevention in the Home Falls can cause injuries. They can happen to people of all ages. There are many things you can do to make your home safe and to help prevent falls. What can I do on the outside of my home? Regularly fix the edges of walkways and driveways and fix any cracks. Remove anything that might make you trip as you walk through a door, such as a raised step or threshold. Trim any bushes or trees on the path to your home. Use bright outdoor lighting. Clear any walking paths of anything that might make someone trip, such as rocks or  tools. Regularly check to see if handrails are loose or broken. Make sure that both sides of any steps have handrails. Any raised decks and porches should have guardrails on the edges. Have any leaves, snow, or ice cleared regularly. Use sand or salt on walking paths during winter. Clean up any spills in your garage right away. This includes oil or grease spills. What can I do in the bathroom? Use night lights. Install grab bars by the toilet and in the tub and shower. Do not use towel bars as grab bars. Use non-skid mats or decals in the tub or shower. If you need to sit down in the shower, use a plastic, non-slip stool. Keep the floor dry. Clean up any water that spills on the floor as soon as it happens. Remove soap buildup in the tub or shower regularly. Attach bath mats securely with double-sided non-slip rug tape. Do not have throw rugs and other things on the floor that can make you trip. What can I do in the bedroom? Use night lights. Make sure that you have a light by your bed that is easy to reach. Do not use  any sheets or blankets that are too big for your bed. They should not hang down onto the floor. Have a firm chair that has side arms. You can use this for support while you get dressed. Do not have throw rugs and other things on the floor that can make you trip. What can I do in the kitchen? Clean up any spills right away. Avoid walking on wet floors. Keep items that you use a lot in easy-to-reach places. If you need to reach something above you, use a strong step stool that has a grab bar. Keep electrical cords out of the way. Do not use floor polish or wax that makes floors slippery. If you must use wax, use non-skid floor wax. Do not have throw rugs and other things on the floor that can make you trip. What can I do with my stairs? Do not leave any items on the stairs. Make sure that there are handrails on both sides of the stairs and use them. Fix handrails that are  broken or loose. Make sure that handrails are as long as the stairways. Check any carpeting to make sure that it is firmly attached to the stairs. Fix any carpet that is loose or worn. Avoid having throw rugs at the top or bottom of the stairs. If you do have throw rugs, attach them to the floor with carpet tape. Make sure that you have a light switch at the top of the stairs and the bottom of the stairs. If you do not have them, ask someone to add them for you. What else can I do to help prevent falls? Wear shoes that: Do not have high heels. Have rubber bottoms. Are comfortable and fit you well. Are closed at the toe. Do not wear sandals. If you use a stepladder: Make sure that it is fully opened. Do not climb a closed stepladder. Make sure that both sides of the stepladder are locked into place. Ask someone to hold it for you, if possible. Clearly mark and make sure that you can see: Any grab bars or handrails. First and last steps. Where the edge of each step is. Use tools that help you move around (mobility aids) if they are needed. These include: Canes. Walkers. Scooters. Crutches. Turn on the lights when you go into a dark area. Replace any light bulbs as soon as they burn out. Set up your furniture so you have a clear path. Avoid moving your furniture around. If any of your floors are uneven, fix them. If there are any pets around you, be aware of where they are. Review your medicines with your doctor. Some medicines can make you feel dizzy. This can increase your chance of falling. Ask your doctor what other things that you can do to help prevent falls. This information is not intended to replace advice given to you by your health care provider. Make sure you discuss any questions you have with your health care provider. Document Released: 06/30/2009 Document Revised: 02/09/2016 Document Reviewed: 10/08/2014 Elsevier Interactive Patient Education  2017 [employer].

## 2024-06-09 ENCOUNTER — Other Ambulatory Visit: Payer: Self-pay | Admitting: Family Medicine

## 2024-06-09 MED ORDER — NYSTATIN 100000 UNIT/GM EX POWD: 1.0000 | Freq: Three times a day (TID) | CUTANEOUS | 1 refills | Status: DC

## 2024-06-09 MED ORDER — FLUCONAZOLE 100 MG PO TABS: 100.0000 mg | ORAL_TABLET | Freq: Every day | ORAL | 0 refills | Status: AC

## 2024-06-09 MED ORDER — HUMALOG KWIKPEN 200 UNIT/ML ~~LOC~~ SOPN: PEN_INJECTOR | SUBCUTANEOUS | 0 refills | Status: AC

## 2024-06-09 NOTE — Telephone Encounter (Signed)
 Patient picked up.

## 2024-06-09 NOTE — Progress Notes (Signed)
 Patient agrees to taking insulin, she stated that she has taking both before. Please send it to [facility] and nystatin  powder

## 2024-06-10 DIAGNOSIS — M67342 Transient synovitis, left hand: Secondary | ICD-10-CM | POA: Diagnosis not present

## 2024-06-10 DIAGNOSIS — Z794 Long term (current) use of insulin: Secondary | ICD-10-CM | POA: Diagnosis not present

## 2024-06-10 DIAGNOSIS — M353 Polymyalgia rheumatica: Secondary | ICD-10-CM | POA: Diagnosis not present

## 2024-06-10 DIAGNOSIS — M67331 Transient synovitis, right wrist: Secondary | ICD-10-CM | POA: Diagnosis not present

## 2024-06-10 DIAGNOSIS — M7989 Other specified soft tissue disorders: Secondary | ICD-10-CM | POA: Diagnosis not present

## 2024-06-10 DIAGNOSIS — E119 Type 2 diabetes mellitus without complications: Secondary | ICD-10-CM | POA: Diagnosis not present

## 2024-06-10 DIAGNOSIS — M67341 Transient synovitis, right hand: Secondary | ICD-10-CM | POA: Diagnosis not present

## 2024-06-10 DIAGNOSIS — M67332 Transient synovitis, left wrist: Secondary | ICD-10-CM | POA: Diagnosis not present

## 2024-06-11 ENCOUNTER — Ambulatory Visit (INDEPENDENT_AMBULATORY_CARE_PROVIDER_SITE_OTHER)
Admission: RE | Admit: 2024-06-11 | Discharge: 2024-06-11 | Disposition: A | Discharge status: Home / Self Care | Source: Ambulatory Visit | Attending: Family Medicine | Admitting: Family Medicine

## 2024-06-11 ENCOUNTER — Encounter (HOSPITAL_BASED_OUTPATIENT_CLINIC_OR_DEPARTMENT_OTHER): Payer: Self-pay | Admitting: Radiology

## 2024-06-11 DIAGNOSIS — N959 Unspecified menopausal and perimenopausal disorder: Secondary | ICD-10-CM | POA: Diagnosis not present

## 2024-06-11 DIAGNOSIS — Z1231 Encounter for screening mammogram for malignant neoplasm of breast: Secondary | ICD-10-CM | POA: Diagnosis not present

## 2024-06-11 DIAGNOSIS — Z780 Asymptomatic menopausal state: Secondary | ICD-10-CM | POA: Diagnosis not present

## 2024-06-11 DIAGNOSIS — Z9189 Other specified personal risk factors, not elsewhere classified: Secondary | ICD-10-CM

## 2024-06-12 ENCOUNTER — Other Ambulatory Visit: Payer: Self-pay | Admitting: Family Medicine

## 2024-06-12 DIAGNOSIS — J302 Other seasonal allergic rhinitis: Secondary | ICD-10-CM

## 2024-06-14 ENCOUNTER — Ambulatory Visit: Payer: Self-pay | Admitting: Family Medicine

## 2024-06-15 ENCOUNTER — Telehealth: Payer: Self-pay

## 2024-06-15 NOTE — Telephone Encounter (Signed)
 Notified patient of her patient assist is here and ready for pick up.

## 2024-06-30 ENCOUNTER — Other Ambulatory Visit: Payer: Self-pay | Admitting: Family Medicine

## 2024-06-30 DIAGNOSIS — E1142 Type 2 diabetes mellitus with diabetic polyneuropathy: Secondary | ICD-10-CM

## 2024-06-30 MED ORDER — NOVOFINE PEN NEEDLE 32G X 6 MM MISC: 2 refills | Status: AC

## 2024-06-30 NOTE — Telephone Encounter (Signed)
 Copied from CRM [id number]. Topic: Clinical - Medication Refill >> Jun 30, 2024 10:28 AM Selinda RAMAN wrote: Medication: Insulin Pen Needle (NOVOFINE PEN NEEDLE) 32G X 6 MM MISC  Has the patient contacted their pharmacy? No   This is the patient's preferred pharmacy:  [facility] [address] [place] - [address] [facility] [place] [address] Phone: [phone] Fax: [phone]  Is this the correct pharmacy for this prescription? Yes If no, delete pharmacy and type the correct one.   Has the prescription been filled recently? No  Is the patient out of the medication? No but   Has the patient been seen for an appointment in the last year OR does the patient have an upcoming appointment? Yes  Can we respond through MyChart? Yes  Please assist patient further

## 2024-07-01 ENCOUNTER — Telehealth: Payer: Self-pay | Admitting: Family Medicine

## 2024-07-01 NOTE — Telephone Encounter (Signed)
 I left a message on the number(s) listed in the patients chart requesting the patient to call back regarding the upcomming appointment for 07/10/2024. The provider is out of the office that day. The appointment has been canceled. Waiting for the patient to return the call.    NOTE: If the patient does not call back within a week to reschedule this appointment, the front staff will mail the patient a letter requesting to call the office back.

## 2024-07-10 ENCOUNTER — Ambulatory Visit: Admitting: Family Medicine

## 2024-07-24 ENCOUNTER — Telehealth: Payer: Self-pay

## 2024-07-24 NOTE — Telephone Encounter (Signed)
 PAP: Application for Tresiba  has been submitted to [employer], via fax   Completed and signed provider portion faxed to [employer]

## 2024-07-25 ENCOUNTER — Other Ambulatory Visit: Payer: Self-pay | Admitting: Family Medicine

## 2024-07-25 DIAGNOSIS — J302 Other seasonal allergic rhinitis: Secondary | ICD-10-CM

## 2024-07-30 ENCOUNTER — Telehealth: Payer: Self-pay

## 2024-07-30 NOTE — Telephone Encounter (Signed)
 Completed reorder form for Ozempic  and faxed to Dr. Abigail Free to review and sign and return to [employer] at [phone]

## 2024-08-19 ENCOUNTER — Telehealth: Payer: Self-pay | Admitting: Family Medicine

## 2024-08-19 NOTE — Telephone Encounter (Signed)
 Called and informed patient of patient assist ready for pick up.

## 2024-09-01 ENCOUNTER — Other Ambulatory Visit: Payer: Self-pay | Admitting: Family Medicine

## 2024-09-01 DIAGNOSIS — E782 Mixed hyperlipidemia: Secondary | ICD-10-CM

## 2024-09-01 NOTE — Telephone Encounter (Signed)
 PAP: Patient assistance application for Tresiba  has been approved by PAP Companies: [employer] from 09/17/2024 to 09/16/2025. Medication should be delivered to PAP Delivery: Provider's office. For further shipping updates, please contact [employer] at [phone]. Patient ID is: [id number]

## 2024-09-06 ENCOUNTER — Other Ambulatory Visit: Payer: Self-pay | Admitting: Family Medicine

## 2024-09-13 ENCOUNTER — Other Ambulatory Visit: Payer: Self-pay | Admitting: Family Medicine

## 2024-09-13 DIAGNOSIS — J302 Other seasonal allergic rhinitis: Secondary | ICD-10-CM

## 2024-09-15 NOTE — Telephone Encounter (Signed)
PATIENT PICKED UP

## 2024-09-16 ENCOUNTER — Other Ambulatory Visit: Payer: Self-pay | Admitting: Family Medicine

## 2024-09-16 ENCOUNTER — Encounter: Payer: Self-pay | Admitting: Family Medicine

## 2024-09-16 ENCOUNTER — Ambulatory Visit (INDEPENDENT_AMBULATORY_CARE_PROVIDER_SITE_OTHER): Admitting: Family Medicine

## 2024-09-16 VITALS — BP 128/76 | HR 88 | Temp 98.0°F | Ht 61.0 in | Wt 212.0 lb

## 2024-09-16 DIAGNOSIS — B351 Tinea unguium: Secondary | ICD-10-CM | POA: Diagnosis not present

## 2024-09-16 DIAGNOSIS — E1142 Type 2 diabetes mellitus with diabetic polyneuropathy: Secondary | ICD-10-CM | POA: Diagnosis not present

## 2024-09-16 DIAGNOSIS — E782 Mixed hyperlipidemia: Secondary | ICD-10-CM

## 2024-09-16 DIAGNOSIS — Z23 Encounter for immunization: Secondary | ICD-10-CM | POA: Insufficient documentation

## 2024-09-16 DIAGNOSIS — M353 Polymyalgia rheumatica: Secondary | ICD-10-CM | POA: Diagnosis not present

## 2024-09-16 LAB — POCT GLYCOSYLATED HEMOGLOBIN (HGB A1C): HbA1c POC (<> result, manual entry): 8.2 %

## 2024-09-16 MED ORDER — PRAVASTATIN SODIUM 40 MG PO TABS: 40.0000 mg | ORAL_TABLET | Freq: Every day | ORAL | 1 refills | Status: AC

## 2024-09-16 MED ORDER — DEXCOM G7 SENSOR MISC: 3 refills | Status: AC

## 2024-09-16 MED ORDER — NYSTATIN 100000 UNIT/GM EX POWD: 1.0000 | Freq: Three times a day (TID) | CUTANEOUS | 1 refills | Status: AC

## 2024-09-16 MED ORDER — CICLOPIROX 8 % EX SOLN: Freq: Every day | CUTANEOUS | 2 refills | Status: AC

## 2024-09-16 NOTE — Patient Instructions (Addendum)
 Recommend preprandial (before meals) insulin (humalog )  Check sugars before meals  Start on 6 U prior to breakfast, lunch, and supper.  + 1 U if sugar between 151-200.  + 2 U if sugar between 201-250  + 3 U if sugar between 251-300  + 4 U if sugar between 301-350  + 5 U if sugar between 351-400  + 6 U if sugar > 400.

## 2024-09-16 NOTE — Assessment & Plan Note (Signed)
 [place] prescription given.

## 2024-09-16 NOTE — Assessment & Plan Note (Addendum)
 Managed with prednisone  tapering. Symptoms include thumb swelling. - Continue prednisone  taper as prescribed by rheumatologist.

## 2024-09-16 NOTE — Progress Notes (Signed)
 "  Subjective:  Patient ID: Brandi Velazquez, female    DOB: 12/13/1960  Age: 63 y.o. MRN: [id number]  Chief Complaint  Patient presents with   Medical Management of Chronic Issues    Currently on prednisone  until March of 2026.    HPI: Discussed the use of AI scribe software for clinical note transcription with the patient, who gave verbal consent to proceed.  History of Present Illness Brandi Velazquez is a 63 year old female with diabetes and polymyalgia rheumatica who presents for medication management and follow-up.  Glycemic control and diabetes management - Diabetes managed with Farxiga  10 mg daily, Glucophage  1000 mg twice daily, Tresiba  56 units daily, and Humalog  as needed based on blood glucose levels. - Uses Dexcom for continuous glucose monitoring and is due for a refill. - Blood glucose levels tend to increase after prednisone  administration. - Currently taking Ozempic  2 mg weekly through patient assistance, with uncertainty regarding continued access to assistance.  Polymyalgia rheumatica and glucocorticoid therapy - Currently tapering prednisone  for polymyalgia rheumatica, on 9 mg daily and decreasing by 1 mg each month under care of Dr. Camella.  - Significant swelling present in both thumbs. - No current rash or generalized swelling.  Cardiovascular and metabolic medication management - Medication regimen includes spironolactone , ramipril  2.5 mg daily, and Zetia  for cholesterol management.  Respiratory and allergic symptoms - Uses Singulair  for allergies and Flonase  nasal spray. - Breztri  inhaler used as needed for severe shortness of breath; not used unless symptoms are severe. - No shortness of breath with normal activity.  Dermatologic findings - Fungal infection present on toenails. - One toenail has fallen off and regrown abnormally. - No burning sensation in feet.  Weight changes and physical activity - Recent weight loss possibly related to physical  activity from moving back into her house. - Engaged in healthy eating and increased physical activity due to moving.       06/08/2024    2:11 PM 05/29/2023    9:32 AM 02/08/2023    9:38 AM 02/05/2022    3:16 PM 07/18/2021   11:21 AM  Depression screen PHQ 2/9  Decreased Interest 0 0 0 0 0  Down, Depressed, Hopeless 0 0 0 0 0  PHQ - 2 Score 0 0 0 0 0  Altered sleeping  0 0    Tired, decreased energy  1 0    Change in appetite  1 1    Feeling bad or failure about yourself   0 0    Trouble concentrating  0 0    Moving slowly or fidgety/restless  0 0    Suicidal thoughts  0 0    PHQ-9 Score  2  1     Difficult doing work/chores  Not difficult at all Not difficult at all       Data saved with a previous flowsheet row definition        06/08/2024    1:55 PM  Fall Risk   Falls in the past year? 0  Number falls in past yr: 0  Injury with Fall? 0   Risk for fall due to : No Fall Risks  Follow up Falls evaluation completed     Data saved with a previous flowsheet row definition    Patient Care Team: Sherre Clapper, MD as PCP - General (Family Medicine) Camella Fallow, MD as Consulting Physician (Orthopedic Surgery)   Review of Systems  Constitutional:  Negative for chills, fatigue and fever.  HENT:  Negative for congestion, ear pain and sore throat.   Respiratory:  Negative for cough and shortness of breath.   Cardiovascular:  Negative for chest pain.  Gastrointestinal:  Negative for abdominal pain, constipation, diarrhea, nausea and vomiting.  Genitourinary:  Negative for dysuria and urgency.  Musculoskeletal:  Negative for arthralgias and myalgias.  Skin:  Negative for rash.  Neurological:  Negative for dizziness and headaches.  Psychiatric/Behavioral:  Negative for dysphoric mood. The patient is not nervous/anxious.     Medications Ordered Prior to Encounter[1] Past Medical History:  Diagnosis Date   Ankle pain    Back pain of lumbar region with sciatica 01/07/2020    Chronic pain    back   COPD (chronic obstructive pulmonary disease) (HCC)    Depression    Diabetes mellitus    GERD (gastroesophageal reflux disease)    Hyperlipidemia    Hypertension    Low back pain    Obstructive sleep apnea    Pneumonia 2010   Post-menopausal atrophic vaginitis    Superficial phlebitis    Past Surgical History:  Procedure Laterality Date   BACK SURGERY  1990   CESAREAN SECTION     CYST REMOVAL HAND     inner thigh   REFRACTIVE SURGERY Right 06/07/2022   SPINE SURGERY     lumbar laminectomy and diskectomy    Family History  Problem Relation Age of Onset   Diabetes Mother    Heart disease Mother    Cancer Father    Diabetes Sister    Stroke Sister    Lung disease Neg Hx    Social History   Socioeconomic History   Marital status: Single    Spouse name: Not on file   Number of children: Not on file   Years of education: Not on file   Highest education level: Not on file  Occupational History   Not on file  Tobacco Use   Smoking status: Former    Current packs/day: 0.00    Average packs/day: 1.5 packs/day for 15.0 years (22.5 ttl pk-yrs)    Types: Cigarettes    Start date: 03/20/1994    Quit date: 03/20/2009    Years since quitting: 15.5   Smokeless tobacco: Never  Vaping Use   Vaping status: Never Used  Substance and Sexual Activity   Alcohol use: No   Drug use: No   Sexual activity: Not Currently    Partners: Male  Other Topics Concern   Not on file  Social History Narrative   Not on file   Social Drivers of Health   Tobacco Use: Medium Risk (09/16/2024)   Patient History    Smoking Tobacco Use: Former    Smokeless Tobacco Use: Never    Passive Exposure: Not on file  Financial Resource Strain: Medium Risk (06/08/2024)   Overall Financial Resource Strain (CARDIA)    Difficulty of Paying Living Expenses: Somewhat hard  Food Insecurity: No Food Insecurity (12/04/2023)   Hunger Vital Sign    Worried About Running Out of Food in the  Last Year: Never true    Ran Out of Food in the Last Year: Never true  Transportation Needs: No Transportation Needs (12/04/2023)   PRAPARE - Administrator, Civil Service (Medical): No    Lack of Transportation (Non-Medical): No  Physical Activity: Insufficiently Active (06/08/2024)   Exercise Vital Sign    Days of Exercise per Week: 4 days    Minutes of Exercise per Session: 30  min  Stress: No Stress Concern Present (06/08/2024)   [employer] of Occupational Health - Occupational Stress Questionnaire    Feeling of Stress: Not at all  Social Connections: Moderately Isolated (05/29/2023)   Social Connection and Isolation Panel    Frequency of Communication with Friends and Family: More than three times a week    Frequency of Social Gatherings with Friends and Family: More than three times a week    Attends Religious Services: More than 4 times per year    Active Member of Clubs or Organizations: No    Attends Engineer, Structural: Not on file    Marital Status: Divorced  Depression (PHQ2-9): Low Risk (06/08/2024)   Depression (PHQ2-9)    PHQ-2 Score: 0  Alcohol Screen: Low Risk (06/08/2024)   Alcohol Screen    Last Alcohol Screening Score (AUDIT): 0  Housing: Low Risk (12/04/2023)   Housing Stability Vital Sign    Unable to Pay for Housing in the Last Year: No    Number of Times Moved in the Last Year: 0    Homeless in the Last Year: No  Utilities: Not At Risk (12/04/2023)   AHC Utilities    Threatened with loss of utilities: No  Health Literacy: Adequate Health Literacy (06/08/2024)   B1300 Health Literacy    Frequency of need for help with medical instructions: Never    Objective:  BP 128/76   Pulse 88   Temp 98 F (36.7 C)   Ht 5' 1 (1.549 m)   Wt 212 lb (96.2 kg)   SpO2 97%   BMI 40.06 kg/m      09/16/2024    7:53 AM 06/08/2024    2:06 PM 04/27/2024    8:11 AM  BP/Weight  Systolic BP 128  889  Diastolic BP 76  60  Wt. (Lbs) 212 210 213   BMI 40.06 kg/m2 39.68 kg/m2 40.25 kg/m2    Physical Exam Vitals reviewed.  Constitutional:      Appearance: Normal appearance. She is obese.  Neck:     Vascular: No carotid bruit.  Cardiovascular:     Rate and Rhythm: Normal rate and regular rhythm.     Pulses: Normal pulses.     Heart sounds: Normal heart sounds.  Pulmonary:     Effort: Pulmonary effort is normal. No respiratory distress.     Breath sounds: Normal breath sounds.  Abdominal:     General: Abdomen is flat. Bowel sounds are normal.     Palpations: Abdomen is soft.     Tenderness: There is no abdominal tenderness.  Neurological:     Mental Status: She is alert and oriented to person, place, and time.  Psychiatric:        Mood and Affect: Mood normal.        Behavior: Behavior normal.      Diabetic foot exam was performed with the following findings:   Normal sensation of 10g monofilament Intact posterior tibialis and dorsalis pedis pulses BL great nail thickened - onychomycosis      Lab Results  Component Value Date   WBC 13.0 (H) 04/20/2024   HGB 14.1 04/20/2024   HCT 45.2 04/20/2024   PLT 361 04/20/2024   GLUCOSE 201 (H) 04/29/2024   CHOL 115 04/01/2024   TRIG 82 04/01/2024   HDL 45 04/01/2024   LDLCALC 54 04/01/2024   ALT 18 04/29/2024   AST 13 04/29/2024   NA 140 04/29/2024   K 4.9 04/29/2024  CL 103 04/29/2024   CREATININE 1.06 (H) 04/29/2024   BUN 24 04/29/2024   CO2 19 (L) 04/29/2024   TSH 2.270 04/20/2024   HGBA1C 8.2 09/16/2024    Results for orders placed or performed in visit on 09/16/24  POCT glycosylated hemoglobin (Hb A1C)   Collection Time: 09/16/24  8:07 AM  Result Value Ref Range   Hemoglobin A1C     HbA1c POC (<> result, manual entry) 8.2 4.0 - 5.6 %   HbA1c, POC (prediabetic range)     HbA1c, POC (controlled diabetic range)    .  Assessment & Plan:   Assessment & Plan Mixed hyperlipidemia Well controlled.  No changes to medicines. Pravastatin  40 mg daily and  zetia  10 mg daily. Continue to work on eating a healthy diet and exercise.   Orders:   pravastatin  (PRAVACHOL ) 40 MG tablet; Take 1 tablet (40 mg total) by mouth at bedtime.   Diabetic polyneuropathy associated with type 2 diabetes mellitus (HCC) A1c slightly elevated at 7.2, improved from 7.5. Glycemic control suboptimal due to non-adherence to insulin regimen and prednisone  use. - Prescribed Dexcom for continuous glucose monitoring. - Instructed to administer 6 units of Humalog  before meals with sliding scale adjustments. Recommend preprandial (before meals) insulin (humalog )  Check sugars before meals  Start on 6 U prior to breakfast, lunch, and supper.  + 1 U if sugar between 151-200.  + 2 U if sugar between 201-250  + 3 U if sugar between 251-300  + 4 U if sugar between 301-350  + 5 U if sugar between 351-400  + 6 U if sugar > 400. - Continue Farxiga  10 mg daily, Glucophage  1000 mg twice daily, Tresiba  56 units daily, and Ozempic  2 mg weekly. - Discussed Ozempic  lack of availability through PAP and advised insurance consultation.  Orders:   POCT glycosylated hemoglobin (Hb A1C)  Need for vaccination  Orders:   Pneumococcal conjugate vaccine 20-valent (Prevnar 20)   Pfizer Comirnaty Covid-19 Vaccine 38yrs and older  PMR (polymyalgia rheumatica) Managed with prednisone  tapering. Symptoms include thumb swelling. - Continue prednisone  taper as prescribed by rheumatologist.    Onychomycosis Penlac prescription given.     - Will schedule Pap smear at next visit.  Body mass index is 40.06 kg/m.     Meds ordered this encounter  Medications   pravastatin  (PRAVACHOL ) 40 MG tablet    Sig: Take 1 tablet (40 mg total) by mouth at bedtime.    Dispense:  90 tablet    Refill:  1   nystatin  (MYCOSTATIN /NYSTOP ) powder    Sig: Apply 1 Application topically 3 (three) times daily.    Dispense:  120 g    Refill:  1   ciclopirox (PENLAC) 8 % solution    Sig: Apply topically at  bedtime. Apply over nail and surrounding skin. Apply daily over previous coat. After seven (7) days, may remove with alcohol and continue cycle.    Dispense:  6.6 mL    Refill:  2    Orders Placed This Encounter  Procedures   Pneumococcal conjugate vaccine 20-valent (Prevnar 20)   Pfizer Comirnaty Covid-19 Vaccine 54yrs and older   POCT glycosylated hemoglobin (Hb A1C)       Follow-up: Return in about 3 months (around 12/15/2024) for chronic follow up.  An After Visit Summary was printed and given to the patient.  Abigail Free, MD [facility] [phone]      [1]  Current Outpatient Medications on File Prior  to Visit  Medication Sig Dispense Refill   Blood Glucose Monitoring Suppl DEVI 1 each by Does not apply route in the morning, at noon, and at bedtime. May substitute to any manufacturer covered by patient's insurance. 1 each 0   budesonide-glycopyrrolate-formoterol (BREZTRI  AEROSPHERE) 160-9-4.8 MCG/ACT AERO inhaler Inhale 2 puffs into the lungs in the morning and at bedtime. (Patient taking differently: Inhale 2 puffs into the lungs in the morning and at bedtime. AS NEEDED) 10.7 g 2   dapagliflozin  propanediol (FARXIGA ) 10 MG TABS tablet Take 1 tablet (10 mg total) by mouth daily. 90 tablet 3   ezetimibe  (ZETIA ) 10 MG tablet Take 1 tablet by mouth once daily 90 tablet 0   fluconazole  (DIFLUCAN ) 100 MG tablet Take 1 tablet (100 mg total) by mouth daily. 7 tablet 0   fluticasone  (FLONASE ) 50 MCG/ACT nasal spray USE 2 SPRAY(S) IN EACH NOSTRIL ONCE DAILY AS DIRECTED 16 g 0   Glucose Blood (BLOOD GLUCOSE TEST STRIPS) STRP 1 each by In Vitro route daily. May substitute to any manufacturer covered by patient's insurance. 100 strip 3   insulin degludec  (TRESIBA  FLEXTOUCH) 200 UNIT/ML FlexTouch Pen Inject 46 Units into the skin daily. (Patient taking differently: Inject 56 Units into the skin daily.) 108 mL 0   insulin lispro  (HUMALOG  KWIKPEN) 200 UNIT/ML KwikPen Recommend  preprandial (before meals) insulin (novolog or humalog ) Check sugars before meals Start on 6 U prior to breakfast, lunch, and supper. + 1 U if sugar between 151-200. + 2 U if sugar between 201-250 + 3 U if sugar between 251-300 + 4 U if sugar between 301-350 + 5 U if sugar between 351-400 + 6 U if sugar > 400. 18 mL 0   Insulin Pen Needle (NOVOFINE PEN NEEDLE) 32G X 6 MM MISC Use new needle after every injecion 100 each 2   metFORMIN  (GLUCOPHAGE ) 1000 MG tablet Take 1 tablet by mouth twice daily with food 180 tablet 0   montelukast  (SINGULAIR ) 10 MG tablet TAKE 1 TABLET BY MOUTH ONCE DAILY AT BEDTIME 90 tablet 0   Multiple Vitamin (MULTIVITAMIN) tablet Take 1 tablet by mouth daily.     predniSONE  (DELTASONE ) 10 MG tablet Take 10 mg by mouth daily.     promethazine  (PHENERGAN ) 25 MG tablet TAKE 1 TABLET BY MOUTH EVERY 8 HOURS AS NEEDED FOR NAUSEA FOR VOMITING 20 tablet 0   ramipril  (ALTACE ) 2.5 MG capsule Take 1 capsule by mouth in the morning 90 capsule 0   Semaglutide , 2 MG/DOSE, (OZEMPIC , 2 MG/DOSE,) 8 MG/3ML SOPN INJECT 2MG  UNDER THE SKIN ONE TIME WEEKLY 9 mL 1   spironolactone  (ALDACTONE ) 25 MG tablet Take 1 tablet (25 mg total) by mouth daily. 90 tablet 1   No current facility-administered medications on file prior to visit.   "

## 2024-09-16 NOTE — Assessment & Plan Note (Addendum)
 Well controlled.  No changes to medicines. Pravastatin  40 mg daily and zetia  10 mg daily. Continue to work on eating a healthy diet and exercise.   Orders:   pravastatin  (PRAVACHOL ) 40 MG tablet; Take 1 tablet (40 mg total) by mouth at bedtime.

## 2024-09-16 NOTE — Assessment & Plan Note (Addendum)
 A1c slightly elevated at 7.2, improved from 7.5. Glycemic control suboptimal due to non-adherence to insulin regimen and prednisone  use. - Prescribed Dexcom for continuous glucose monitoring. - Instructed to administer 6 units of Humalog  before meals with sliding scale adjustments. Recommend preprandial (before meals) insulin (humalog )  Check sugars before meals  Start on 6 U prior to breakfast, lunch, and supper.  + 1 U if sugar between 151-200.  + 2 U if sugar between 201-250  + 3 U if sugar between 251-300  + 4 U if sugar between 301-350  + 5 U if sugar between 351-400  + 6 U if sugar > 400. - Continue Farxiga  10 mg daily, Glucophage  1000 mg twice daily, Tresiba  56 units daily, and Ozempic  2 mg weekly. - Discussed Ozempic  lack of availability through PAP and advised insurance consultation.  Orders:   POCT glycosylated hemoglobin (Hb A1C)

## 2024-09-16 NOTE — Assessment & Plan Note (Addendum)
" °  Orders:   Pneumococcal conjugate vaccine 20-valent (Prevnar 20)   Pfizer Comirnaty Covid-19 Vaccine 54yrs and older  "

## 2024-09-24 ENCOUNTER — Telehealth: Payer: Self-pay

## 2024-09-24 NOTE — Telephone Encounter (Signed)
 Called patient to let her know that we received patient assistance for Tresiba  U200 and pen needles. She was informed and she will come by to the office.

## 2024-09-28 NOTE — Telephone Encounter (Signed)
"  Patient picked up patient assistance.   "

## 2024-10-06 ENCOUNTER — Other Ambulatory Visit: Payer: Self-pay | Admitting: Family Medicine

## 2024-10-06 DIAGNOSIS — J302 Other seasonal allergic rhinitis: Secondary | ICD-10-CM

## 2024-12-18 ENCOUNTER — Ambulatory Visit: Admitting: Family Medicine
